# Patient Record
Sex: Female | Born: 1995 | Race: Black or African American | Hispanic: No | Marital: Single | Smoking: Never smoker
Health system: Southern US, Community
[De-identification: ages and names within clinical notes are randomized; demographics above are authoritative.]

## PROBLEM LIST (undated history)

## (undated) ENCOUNTER — Emergency Department (HOSPITAL_COMMUNITY): Discharge status: Absent without leave | Payer: Medicaid Other

## (undated) ENCOUNTER — Inpatient Hospital Stay (HOSPITAL_COMMUNITY): Payer: Self-pay

## (undated) HISTORY — PX: NO PAST SURGERIES: SHX2092

---
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
## 2014-08-04 ENCOUNTER — Encounter (HOSPITAL_COMMUNITY): Payer: Self-pay | Admitting: *Deleted

## 2014-08-04 ENCOUNTER — Emergency Department (HOSPITAL_COMMUNITY)
Admission: EM | Admit: 2014-08-04 | Discharge: 2014-08-04 | Disposition: A | Discharge status: Home / Self Care | Payer: Managed Care, Other (non HMO) | Attending: Emergency Medicine | Admitting: Emergency Medicine

## 2014-08-04 DIAGNOSIS — R1030 Lower abdominal pain, unspecified: Secondary | ICD-10-CM | POA: Diagnosis present

## 2014-08-04 DIAGNOSIS — Z3202 Encounter for pregnancy test, result negative: Secondary | ICD-10-CM | POA: Diagnosis not present

## 2014-08-04 DIAGNOSIS — N390 Urinary tract infection, site not specified: Secondary | ICD-10-CM | POA: Insufficient documentation

## 2014-08-04 LAB — COMPREHENSIVE METABOLIC PANEL
ALBUMIN: 3.9 g/dL (ref 3.5–5.2)
ALK PHOS: 94 U/L (ref 39–117)
ALT: 14 U/L (ref 0–35)
ANION GAP: 8 (ref 5–15)
AST: 20 U/L (ref 0–37)
BILIRUBIN TOTAL: 0.7 mg/dL (ref 0.3–1.2)
BUN: 9 mg/dL (ref 6–23)
CO2: 25 mmol/L (ref 19–32)
CREATININE: 0.69 mg/dL (ref 0.50–1.10)
Calcium: 9 mg/dL (ref 8.4–10.5)
Chloride: 103 mEq/L (ref 96–112)
GFR calc Af Amer: >90 mL/min (ref >90)
GFR calc non Af Amer: >90 mL/min (ref >90)
GLUCOSE: 99 mg/dL (ref 70–99)
POTASSIUM: 3.8 mmol/L (ref 3.5–5.1)
SODIUM: 136 mmol/L (ref 135–145)
Total Protein: 7 g/dL (ref 6.0–8.3)

## 2014-08-04 LAB — URINALYSIS, ROUTINE W REFLEX MICROSCOPIC
Bilirubin Urine: NEGATIVE
GLUCOSE, UA: NEGATIVE mg/dL
HGB URINE DIPSTICK: NEGATIVE
Ketones, ur: 15 mg/dL — AB
Nitrite: NEGATIVE
PH: 6 (ref 5.0–8.0)
PROTEIN: NEGATIVE mg/dL
SPECIFIC GRAVITY, URINE: 1.033 — AB (ref 1.005–1.030)
Urobilinogen, UA: 1 mg/dL (ref 0.0–1.0)

## 2014-08-04 LAB — CBC WITH DIFFERENTIAL/PLATELET
BASOS ABS: 0 10*3/uL (ref 0.0–0.1)
BASOS PCT: 0 % (ref 0–1)
Eosinophils Absolute: 0.1 10*3/uL (ref 0.0–0.7)
Eosinophils Relative: 1 % (ref 0–5)
HEMATOCRIT: 36.7 % (ref 36.0–46.0)
HEMOGLOBIN: 11.9 g/dL — AB (ref 12.0–15.0)
LYMPHS ABS: 3 10*3/uL (ref 0.7–4.0)
LYMPHS PCT: 23 % (ref 12–46)
MCH: 28.7 pg (ref 26.0–34.0)
MCHC: 32.4 g/dL (ref 30.0–36.0)
MCV: 88.4 fL (ref 78.0–100.0)
MONO ABS: 1.1 10*3/uL — AB (ref 0.1–1.0)
Monocytes Relative: 8 % (ref 3–12)
NEUTROS ABS: 9 10*3/uL — AB (ref 1.7–7.7)
NEUTROS PCT: 68 % (ref 43–77)
Platelets: 374 10*3/uL (ref 150–400)
RBC: 4.15 MIL/uL (ref 3.87–5.11)
RDW: 12.9 % (ref 11.5–15.5)
WBC: 13.2 10*3/uL — ABNORMAL HIGH (ref 4.0–10.5)

## 2014-08-04 LAB — URINE MICROSCOPIC-ADD ON

## 2014-08-04 LAB — LIPASE, BLOOD: Lipase: 25 U/L (ref 11–59)

## 2014-08-04 LAB — PREGNANCY, URINE: Preg Test, Ur: NEGATIVE

## 2014-08-04 MED ORDER — SULFAMETHOXAZOLE-TRIMETHOPRIM 800-160 MG PO TABS: 1.0000 | ORAL_TABLET | Freq: Two times a day (BID) | ORAL | Status: DC

## 2014-08-04 MED ORDER — PHENAZOPYRIDINE HCL 200 MG PO TABS: 200.0000 mg | ORAL_TABLET | Freq: Three times a day (TID) | ORAL | Status: DC

## 2014-08-04 NOTE — Discharge Instructions (Signed)

## 2014-08-04 NOTE — ED Provider Notes (Signed)
CSN: [id number]     Arrival date & time 08/04/14  2159 History  This chart was scribed for Brandi BakerAnthony T Seamus Warehime, MD by Murriel HopperAlec Bankhead, ED Scribe. This patient was seen in room D31C/D31C and the patient's care was started at 11:19 PM.    Chief Complaint  Patient presents with  . Abdominal Pain    The history is provided by the patient. No language interpreter was used.     HPI Comments: Brandi Henson is a 19 y.o. female who presents to the Emergency Department complaining of intermittent, sharp lower abdominal pain with associated dysuria that has been present for two days. Pt states that her symptoms worsen with movement. Pt states that she came into the ED tonight because her pain was so severe this morning that she could not move. Pt describes the pain as a sharp pressure in the area. Pt denies fever, chills, vomiting, diarrhea, vaginal discharge, or urinary frequency. Patient denies any prior history of these current symptoms and no treatment use prior to arrival    History reviewed. No pertinent past medical history. History reviewed. No pertinent past surgical history. History reviewed. No pertinent family history. History  Substance Use Topics  . Smoking status: Never Smoker   . Smokeless tobacco: Not on file  . Alcohol Use: Not on file   OB History    No data available     Review of Systems  Constitutional: Negative for fever and chills.  Gastrointestinal: Positive for abdominal pain. Negative for vomiting and diarrhea.  Genitourinary: Positive for dysuria. Negative for frequency and vaginal discharge.  All other systems reviewed and are negative.     Allergies  Review of patient's allergies indicates no known allergies.  Home Medications   Prior to Admission medications   Not on File   BP 117/62 mmHg  Pulse 85  Temp(Src) 98.2 F (36.8 C) (Oral)  Resp 21  Ht 5\' 10"  (1.778 m)  Wt 156 lb (70.761 kg)  BMI 22.38 kg/m2  SpO2 100%  LMP 07/19/2014 Physical Exam   Constitutional: She is oriented to person, place, and time. She appears well-developed and well-nourished.  Non-toxic appearance. No distress.  HENT:  Head: Normocephalic and atraumatic.  Eyes: Conjunctivae, EOM and lids are normal. Pupils are equal, round, and reactive to light.  Neck: Normal range of motion. Neck supple. No tracheal deviation present. No thyroid mass present.  Cardiovascular: Normal rate, regular rhythm and normal heart sounds.  Exam reveals no gallop.   No murmur heard. Pulmonary/Chest: Effort normal and breath sounds normal. No stridor. No respiratory distress. She has no decreased breath sounds. She has no wheezes. She has no rhonchi. She has no rales.  Abdominal: Soft. Normal appearance and bowel sounds are normal. She exhibits no distension. There is tenderness. There is no rebound, no guarding and no CVA tenderness.  Suprapubic tenderness  No guarding No rebound  Musculoskeletal: Normal range of motion. She exhibits no edema or tenderness.  Neurological: She is alert and oriented to person, place, and time. She has normal strength. No cranial nerve deficit or sensory deficit. GCS eye subscore is 4. GCS verbal subscore is 5. GCS motor subscore is 6.  Skin: Skin is warm and dry. No abrasion and no rash noted.  Psychiatric: She has a normal mood and affect. Her speech is normal and behavior is normal.  Nursing note and vitals reviewed.   ED Course  Procedures (including critical care time)  DIAGNOSTIC STUDIES: Oxygen Saturation is 100% on RA,  normal by my interpretation.    COORDINATION OF CARE: 11:21 PM Discussed treatment plan with pt at bedside and pt agreed to plan.   Labs Review Labs Reviewed  CBC WITH DIFFERENTIAL - Abnormal; Notable for the following:    WBC 13.2 (*)    Hemoglobin 11.9 (*)    Neutro Abs 9.0 (*)    Monocytes Absolute 1.1 (*)    All other components within normal limits  COMPREHENSIVE METABOLIC PANEL  LIPASE, BLOOD  PREGNANCY, URINE   URINALYSIS, ROUTINE W REFLEX MICROSCOPIC    Imaging Review No results found.   EKG Interpretation None      MDM   Final diagnoses:  None    I personally performed the services described in this documentation, which was scribed in my presence. The recorded information has been reviewed and is accurate.  patient to be treated for her UTI.    Brandi Baker, MD 08/04/14 [id number]

## 2014-08-04 NOTE — ED Notes (Signed)
Pt in c/o left suprapubic abd pain with nausea for the last two days, denies vaginal discharge, c/o pain with urination, no distress noted

## 2014-08-04 NOTE — ED Notes (Signed)
Pt was unable to void, urine still needs to be collected

## 2015-01-18 ENCOUNTER — Ambulatory Visit (INDEPENDENT_AMBULATORY_CARE_PROVIDER_SITE_OTHER): Payer: Managed Care, Other (non HMO) | Admitting: Internal Medicine

## 2015-01-18 VITALS — BP 108/76 | HR 89 | Temp 98.3°F | Resp 17 | Ht 69.0 in | Wt 137.0 lb

## 2015-01-18 DIAGNOSIS — S161XXA Strain of muscle, fascia and tendon at neck level, initial encounter: Secondary | ICD-10-CM

## 2015-01-18 DIAGNOSIS — M545 Low back pain, unspecified: Secondary | ICD-10-CM

## 2015-01-18 DIAGNOSIS — S46812A Strain of other muscles, fascia and tendons at shoulder and upper arm level, left arm, initial encounter: Secondary | ICD-10-CM

## 2015-01-18 MED ORDER — CYCLOBENZAPRINE HCL 10 MG PO TABS: 10.0000 mg | ORAL_TABLET | Freq: Every day | ORAL | Status: DC

## 2015-01-18 MED ORDER — MELOXICAM 15 MG PO TABS: 15.0000 mg | ORAL_TABLET | Freq: Every day | ORAL | Status: DC
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
## 2015-11-07 ENCOUNTER — Encounter (HOSPITAL_COMMUNITY): Payer: Self-pay | Admitting: Emergency Medicine

## 2015-11-07 DIAGNOSIS — K5909 Other constipation: Secondary | ICD-10-CM | POA: Insufficient documentation

## 2015-11-07 DIAGNOSIS — Z791 Long term (current) use of non-steroidal anti-inflammatories (NSAID): Secondary | ICD-10-CM | POA: Insufficient documentation

## 2015-11-07 DIAGNOSIS — Z79899 Other long term (current) drug therapy: Secondary | ICD-10-CM | POA: Diagnosis not present

## 2015-11-07 DIAGNOSIS — Z3202 Encounter for pregnancy test, result negative: Secondary | ICD-10-CM | POA: Insufficient documentation

## 2015-11-07 DIAGNOSIS — R1031 Right lower quadrant pain: Secondary | ICD-10-CM | POA: Diagnosis present

## 2015-11-07 LAB — URINALYSIS, ROUTINE W REFLEX MICROSCOPIC
GLUCOSE, UA: NEGATIVE mg/dL
Hgb urine dipstick: NEGATIVE
Ketones, ur: NEGATIVE mg/dL
LEUKOCYTES UA: NEGATIVE
NITRITE: NEGATIVE
PH: 6 (ref 5.0–8.0)
PROTEIN: 30 mg/dL — AB
Specific Gravity, Urine: 1.028 (ref 1.005–1.030)

## 2015-11-07 LAB — COMPREHENSIVE METABOLIC PANEL
ALT: 10 U/L — ABNORMAL LOW (ref 14–54)
ANION GAP: 10 (ref 5–15)
AST: 16 U/L (ref 15–41)
Albumin: 3.9 g/dL (ref 3.5–5.0)
Alkaline Phosphatase: 52 U/L (ref 38–126)
BUN: 8 mg/dL (ref 6–20)
CHLORIDE: 106 mmol/L (ref 101–111)
CO2: 24 mmol/L (ref 22–32)
Calcium: 9 mg/dL (ref 8.9–10.3)
Creatinine, Ser: 0.76 mg/dL (ref 0.44–1.00)
Glucose, Bld: 82 mg/dL (ref 65–99)
POTASSIUM: 3.9 mmol/L (ref 3.5–5.1)
Sodium: 140 mmol/L (ref 135–145)
Total Bilirubin: 1.2 mg/dL (ref 0.3–1.2)
Total Protein: 6.9 g/dL (ref 6.5–8.1)

## 2015-11-07 LAB — URINE MICROSCOPIC-ADD ON: RBC / HPF: NONE SEEN RBC/hpf (ref 0–5)

## 2015-11-07 LAB — CBC
HEMATOCRIT: 35.8 % — AB (ref 36.0–46.0)
Hemoglobin: 11.7 g/dL — ABNORMAL LOW (ref 12.0–15.0)
MCH: 29.9 pg (ref 26.0–34.0)
MCHC: 32.7 g/dL (ref 30.0–36.0)
MCV: 91.6 fL (ref 78.0–100.0)
PLATELETS: 283 10*3/uL (ref 150–400)
RBC: 3.91 MIL/uL (ref 3.87–5.11)
RDW: 12.1 % (ref 11.5–15.5)
WBC: 7.3 10*3/uL (ref 4.0–10.5)

## 2015-11-07 LAB — POC URINE PREG, ED: Preg Test, Ur: NEGATIVE

## 2015-11-07 NOTE — ED Notes (Signed)
Pt. reports intermittent  RLQ cramping onset today , denies nausea or vomitting , no diarrhea or fever , denies urinary discomfort or vaginal discharge.

## 2015-11-08 ENCOUNTER — Emergency Department (HOSPITAL_COMMUNITY)
Admission: EM | Admit: 2015-11-08 | Discharge: 2015-11-08 | Disposition: A | Discharge status: Home / Self Care | Payer: Managed Care, Other (non HMO) | Attending: Emergency Medicine | Admitting: Emergency Medicine

## 2015-11-08 ENCOUNTER — Encounter (HOSPITAL_COMMUNITY): Payer: Self-pay | Admitting: Emergency Medicine

## 2015-11-08 DIAGNOSIS — IMO0001 Reserved for inherently not codable concepts without codable children: Secondary | ICD-10-CM

## 2015-11-08 DIAGNOSIS — K5909 Other constipation: Secondary | ICD-10-CM

## 2015-11-08 MED ORDER — GI COCKTAIL ~~LOC~~
30.0000 mL | Freq: Once | ORAL | Status: AC
  Administered 2015-11-08: 30 mL via ORAL
  Filled 2015-11-08: qty 30

## 2015-11-08 MED ORDER — DICYCLOMINE HCL 10 MG/ML IM SOLN
20.0000 mg | Freq: Once | INTRAMUSCULAR | Status: AC
  Administered 2015-11-08: 20 mg via INTRAMUSCULAR
  Filled 2015-11-08: qty 2

## 2015-11-08 MED ORDER — POLYETHYLENE GLYCOL 3350 17 GM/SCOOP PO POWD: 17.0000 g | Freq: Every day | ORAL | Status: DC

## 2015-11-08 NOTE — ED Provider Notes (Signed)
CSN: [id number]     Arrival date & time 11/07/15  2011 History  By signing my name below, I, Marisue HumbleMichelle Chaffee, attest that this documentation has been prepared under the direction and in the presence of Vonetta Foulk, MD . Electronically Signed: Marisue HumbleMichelle Chaffee, Scribe. 11/08/2015. 2:37 AM.   Chief Complaint  Patient presents with  . Abdominal Cramping   Patient is a 20 y.o. female presenting with cramps. The history is provided by the patient. No language interpreter was used.  Abdominal Cramping This is a recurrent problem. The current episode started yesterday. The problem occurs constantly. The problem has not changed since onset.Associated symptoms include abdominal pain. Pertinent negatives include no chest pain, no headaches and no shortness of breath. Nothing aggravates the symptoms. Nothing relieves the symptoms. She has tried nothing for the symptoms. The treatment provided no relief.   HPI Comments:  Rudy Jewlexis Licausi is a 20 y.o. female with no pertinent PMHx who presents to the Emergency Department complaining of moderate intermittent RLQ cramping for "years". She states pain is worse with urination; she usually uses bathroom 3x per day. Last BM a few days ago. No alleviating or exacerbating factors noted. Pt reports similar symptoms with past UTI. Pt notes very irregular diet and low water intake. LNMP 10/02/15. Denies constipation, vaginal discharge, or increased pain with BM.  History reviewed. No pertinent past medical history. History reviewed. No pertinent past surgical history. Family History  Problem Relation Age of Onset  . Diabetes Maternal Grandfather    Social History  Substance Use Topics  . Smoking status: Never Smoker   . Smokeless tobacco: None  . Alcohol Use: No   OB History    No data available     Review of Systems  Respiratory: Negative for shortness of breath.   Cardiovascular: Negative for chest pain.  Gastrointestinal: Positive for abdominal pain.  Negative for constipation and rectal pain.  Genitourinary: Negative for vaginal discharge.  Neurological: Negative for headaches.  All other systems reviewed and are negative.  Allergies  Review of patient's allergies indicates no known allergies.  Home Medications   Prior to Admission medications   Medication Sig Start Date End Date Taking? Authorizing Provider  cyclobenzaprine (FLEXERIL) 10 MG tablet Take 1 tablet (10 mg total) by mouth at bedtime. 01/18/15   Tonye Pearsonobert P Doolittle, MD  meloxicam (MOBIC) 15 MG tablet Take 1 tablet (15 mg total) by mouth daily. 01/18/15   Tonye Pearsonobert P Doolittle, MD  phenazopyridine (PYRIDIUM) 200 MG tablet Take 1 tablet (200 mg total) by mouth 3 (three) times daily. Patient not taking: Reported on 01/18/2015 08/04/14   Lorre NickAnthony Allen, MD  sulfamethoxazole-trimethoprim [facility](SEPTRA DS) 800-160 MG per tablet Take 1 tablet by mouth every 12 (twelve) hours. Patient not taking: Reported on 01/18/2015 08/04/14   Lorre NickAnthony Allen, MD   BP 105/81 mmHg  Pulse 81  Temp(Src) 98.4 F (36.9 C) (Oral)  Resp 16  Ht 6\' 1"  (1.854 m)  Wt 134 lb (60.782 kg)  BMI 17.68 kg/m2  SpO2 100%  LMP 11/02/2015 Physical Exam  Constitutional: She is oriented to person, place, and time. She appears well-developed and well-nourished. No distress.  HENT:  Head: Normocephalic and atraumatic.  Mouth/Throat: Oropharynx is clear and moist.  Eyes: Pupils are equal, round, and reactive to light.  Neck: Normal range of motion. Neck supple.  Cardiovascular: Normal rate, regular rhythm and normal heart sounds.   Pulmonary/Chest: Effort normal and breath sounds normal. No respiratory distress. She has no wheezes. She has  no rales.  Abdominal: Soft. Bowel sounds are increased. There is no tenderness. There is no rigidity, no rebound, no guarding, no tenderness at McBurney's point and negative Murphy's sign.  very gassy throughout; constipation  Musculoskeletal: Normal range of motion.  Neurological: She is  alert and oriented to person, place, and time. She has normal reflexes.  Skin: Skin is warm and dry.  Psychiatric: She has a normal mood and affect.   ED Course  Procedures  DIAGNOSTIC STUDIES:  Oxygen Saturation is 99% on RA, normal by my interpretation.    COORDINATION OF CARE:  2:32 AM Will administer bentyl injection and GI cocktail. Will order UA, CMP, CBC and urine preg. Discussed treatment plan with pt at bedside and pt agreed to plan.  Labs Review Labs Reviewed  COMPREHENSIVE METABOLIC PANEL - Abnormal; Notable for the following:    ALT 10 (*)    All other components within normal limits  CBC - Abnormal; Notable for the following:    Hemoglobin 11.7 (*)    HCT 35.8 (*)    All other components within normal limits  URINALYSIS, ROUTINE W REFLEX MICROSCOPIC (NOT AT [facility]) - Abnormal; Notable for the following:    Color, Urine AMBER (*)    Bilirubin Urine SMALL (*)    Protein, ur 30 (*)    All other components within normal limits  URINE MICROSCOPIC-ADD ON - Abnormal; Notable for the following:    Squamous Epithelial / LPF 6-30 (*)    Bacteria, UA FEW (*)    All other components within normal limits  POC URINE PREG, ED    Imaging Review No results found. I have personally reviewed and evaluated these images and lab results as part of my medical decision-making.   EKG Interpretation None      MDM   Final diagnoses:  None   Results for orders placed or performed during the hospital encounter of 11/08/15  Comprehensive metabolic panel  Result Value Ref Range   Sodium 140 135 - 145 mmol/L   Potassium 3.9 3.5 - 5.1 mmol/L   Chloride 106 101 - 111 mmol/L   CO2 24 22 - 32 mmol/L   Glucose, Bld 82 65 - 99 mg/dL   BUN 8 6 - 20 mg/dL   Creatinine, Ser 2.95 0.44 - 1.00 mg/dL   Calcium 9.0 8.9 - 18.8 mg/dL   Total Protein 6.9 6.5 - 8.1 g/dL   Albumin 3.9 3.5 - 5.0 g/dL   AST 16 15 - 41 U/L   ALT 10 (L) 14 - 54 U/L   Alkaline Phosphatase 52 38 - 126 U/L   Total  Bilirubin 1.2 0.3 - 1.2 mg/dL   GFR calc non Af Amer >60 >60 mL/min   GFR calc Af Amer >60 >60 mL/min   Anion gap 10 5 - 15  CBC  Result Value Ref Range   WBC 7.3 4.0 - 10.5 K/uL   RBC 3.91 3.87 - 5.11 MIL/uL   Hemoglobin 11.7 (L) 12.0 - 15.0 g/dL   HCT 41.6 (L) 60.6 - 30.1 %   MCV 91.6 78.0 - 100.0 fL   MCH 29.9 26.0 - 34.0 pg   MCHC 32.7 30.0 - 36.0 g/dL   RDW 60.1 09.3 - 23.5 %   Platelets 283 150 - 400 K/uL  Urinalysis, Routine w reflex microscopic (not at [facility])  Result Value Ref Range   Color, Urine AMBER (A) YELLOW   APPearance CLEAR CLEAR   Specific Gravity, Urine 1.028 1.005 -  1.030   pH 6.0 5.0 - 8.0   Glucose, UA NEGATIVE NEGATIVE mg/dL   Hgb urine dipstick NEGATIVE NEGATIVE   Bilirubin Urine SMALL (A) NEGATIVE   Ketones, ur NEGATIVE NEGATIVE mg/dL   Protein, ur 30 (A) NEGATIVE mg/dL   Nitrite NEGATIVE NEGATIVE   Leukocytes, UA NEGATIVE NEGATIVE  Urine microscopic-add on  Result Value Ref Range   Squamous Epithelial / LPF 6-30 (A) NONE SEEN   WBC, UA 0-5 0 - 5 WBC/hpf   RBC / HPF NONE SEEN 0 - 5 RBC/hpf   Bacteria, UA FEW (A) NONE SEEN   Urine-Other MUCOUS PRESENT   POC urine preg, ED (not at [facility])  Result Value Ref Range   Preg Test, Ur NEGATIVE NEGATIVE   No results found.  Filed Vitals:   11/08/15 0300 11/08/15 0330  BP: 108/62 105/81  Pulse: 73 81  Temp:    Resp:      Medications  dicyclomine (BENTYL) injection 20 mg (20 mg Intramuscular Given 11/08/15 0330)  gi cocktail (Maalox,Lidocaine,Donnatal) (30 mLs Oral Given 11/08/15 0330)    No signs of surgical abdomen.  Constipation by history and physical with increased gas.  Begin miralax therapy.  Strict return precautions given  I personally performed the services described in this documentation, which was scribed in my presence. The recorded information has been reviewed and is accurate.      Cy Blamer, MD 11/08/15 [phone]

## 2015-11-08 NOTE — Discharge Instructions (Signed)
Constipation, Adult Constipation is when a person:  Poops (has a bowel movement) less than 3 times a week.  Has a hard time pooping.  Has poop that is dry, hard, or bigger than normal. HOME CARE   Eat foods with a lot of fiber in them. This includes fruits, vegetables, beans, and whole grains such as brown rice.  Avoid fatty foods and foods with a lot of sugar. This includes french fries, hamburgers, cookies, candy, and soda.  If you are not getting enough fiber from food, take products with added fiber in them (supplements).  Drink enough fluid to keep your pee (urine) clear or pale yellow.  Exercise on a regular basis, or as told by your doctor.  Go to the restroom when you feel like you need to poop. Do not hold it.  Only take medicine as told by your doctor. Do not take medicines that help you poop (laxatives) without talking to your doctor first. GET HELP RIGHT AWAY IF:   You have bright red blood in your poop (stool).  Your constipation lasts more than 4 days or gets worse.  You have belly (abdominal) or butt (rectal) pain.  You have thin poop (as thin as a pencil).  You lose weight, and it cannot be explained. MAKE SURE YOU:   Understand these instructions.  Will watch your condition.  Will get help right away if you are not doing well or get worse.   This information is not intended to replace advice given to you by your health care provider. Make sure you discuss any questions you have with your health care provider.   Document Released: 12/30/2007 Document Revised: 08/03/2014 Document Reviewed: 04/24/2013 Elsevier Interactive Patient Education 2016 [employer]vier Inc.  

## 2015-12-02 ENCOUNTER — Ambulatory Visit (INDEPENDENT_AMBULATORY_CARE_PROVIDER_SITE_OTHER): Payer: Managed Care, Other (non HMO) | Admitting: Family Medicine

## 2015-12-02 VITALS — BP 114/72 | HR 77 | Temp 98.3°F | Resp 18 | Ht 69.75 in | Wt 132.6 lb

## 2015-12-02 DIAGNOSIS — R55 Syncope and collapse: Secondary | ICD-10-CM | POA: Diagnosis not present

## 2015-12-02 DIAGNOSIS — R42 Dizziness and giddiness: Secondary | ICD-10-CM | POA: Diagnosis not present

## 2015-12-02 LAB — POCT CBC
Granulocyte percent: 65.9 %G (ref 37–80)
HCT, POC: 35.3 % — AB (ref 37.7–47.9)
Hemoglobin: 12.3 g/dL (ref 12.2–16.2)
LYMPH, POC: 2.1 (ref 0.6–3.4)
MCH, POC: 30.7 pg (ref 27–31.2)
MCHC: 34.7 g/dL (ref 31.8–35.4)
MCV: 88.3 fL (ref 80–97)
MID (CBC): 0.4 (ref 0–0.9)
MPV: 8.6 fL (ref 0–99.8)
POC Granulocyte: 4.7 (ref 2–6.9)
POC LYMPH %: 28.8 % (ref 10–50)
POC MID %: 5.3 % (ref 0–12)
Platelet Count, POC: 274 10*3/uL (ref 142–424)
RBC: 4 M/uL — AB (ref 4.04–5.48)
RDW, POC: 12.5 %
WBC: 7.2 10*3/uL (ref 4.6–10.2)

## 2015-12-02 LAB — GLUCOSE, POCT (MANUAL RESULT ENTRY): POC Glucose: 83 mg/dl (ref 70–99)

## 2015-12-02 LAB — POCT URINE PREGNANCY: PREG TEST UR: NEGATIVE

## 2015-12-02 NOTE — Patient Instructions (Addendum)
Drink plenty of fluids  Consider slightly increasing the salt intake in your diet  If you're continuing to have these episodes of feeling like you're going to faint or pass out please return. At that point you might need additional laboratory testing or imaging studies or a referral to a cardiologist for some heart monitoring tests. Most of these episodes however are fairly harmless events in young lady's especially. If you feel like you are going to pass out, sit down or lie down rather than falling down.  Always use sexual protection if you choose to be sexually involved  Near-Syncope Near-syncope (commonly known as near fainting) is sudden weakness, dizziness, or feeling like you might pass out. During an episode of near-syncope, you may also develop pale skin, have tunnel vision, or feel sick to your stomach (nauseous). Near-syncope may occur when getting up after sitting or while standing for a long time. It is caused by a sudden decrease in blood flow to the brain. This decrease can result from various causes or triggers, most of which are not serious. However, because near-syncope can sometimes be a sign of something serious, a medical evaluation is required. The specific cause is often not determined. HOME CARE INSTRUCTIONS  Monitor your condition for any changes. The following actions may help to alleviate any discomfort you are experiencing:  Have someone stay with you until you feel stable.  Lie down right away and prop your feet up if you start feeling like you might faint. Breathe deeply and steadily. Wait until all the symptoms have passed. Most of these episodes last only a few minutes. You may feel tired for several hours.   Drink enough fluids to keep your urine clear or pale yellow.   If you are taking blood pressure or heart medicine, get up slowly when seated or lying down. Take several minutes to sit and then stand. This can reduce dizziness.  Follow up with your health  care provider as directed. SEEK IMMEDIATE MEDICAL CARE IF:   You have a severe headache.   You have unusual pain in the chest, abdomen, or back.   You are bleeding from the mouth or rectum, or you have black or tarry stool.   You have an irregular or very fast heartbeat.   You have repeated fainting or have seizure-like jerking during an episode.   You faint when sitting or lying down.   You have confusion.   You have difficulty walking.   You have severe weakness.   You have vision problems.  MAKE SURE YOU:   Understand these instructions.  Will watch your condition.  Will get help right away if you are not doing well or get worse.   This information is not intended to replace advice given to you by your health care provider. Make sure you discuss any questions you have with your health care provider.   Document Released: 07/13/2005 Document Revised: 07/18/2013 Document Reviewed: 12/16/2012 Elsevier Interactive Patient Education 2016 [employer]    IF you received an x-ray today, you will receive an invoice from [facility]. Please contact [facility] at [phone] with questions or concerns regarding your invoice.   IF you received labwork today, you will receive an invoice from [facility]. Please contact [facility] at [phone] with questions or concerns regarding your invoice.   Our billing staff will not be able to assist you with questions regarding bills from these companies.  You will be contacted with the lab results  as soon as they are available. The fastest way to get your results is to activate your My Chart account. Instructions are located on the last page of this paperwork. If you have not heard from [place] regarding the results in 2 weeks, please contact this office.

## 2015-12-02 NOTE — Progress Notes (Signed)
Patient ID: Brandi Henson, female    DOB: 03-31-1996  Age: 20 y.o. MRN: [id number]  Chief Complaint  Patient presents with  . Dizziness    off and on since last Thursday    Subjective:   20 year old generally healthy female who works 2 jobs at [employer] and [employer]. She has had problems since last week of several episodes of orthostatic dizziness. 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 She can sit down and things clear up. She has had to quit her position several times. She has had it happen at home but she sits down or lies down and things are fine. She says she drinks plenty of fluids. Her last menstrual cycle was on April 1. She is sexually involved, not using protection, and predicted her menstrual cycle to come on on May 8. She is not taking any regular medications, not been drinking or using drugs. No nausea or vomiting. No other major symptoms except for the orthostatic dizziness.  Current allergies, medications, problem list, past/family and social histories reviewed.  Objective:  BP 114/72 mmHg  Pulse 77  Temp(Src) 98.3 F (36.8 C) (Oral)  Resp 18  Ht 5' 9.75" (1.772 m)  Wt 132 lb 9.6 oz (60.147 kg)  BMI 19.16 kg/m2  SpO2 96%  LMP 11/02/2015  No major acute distress. Her TMs normal. Eyes PERRLA. Throat clear. Neck supple without nodes or thyromegaly. No carotid bruits. Chest clear. Heart regular without murmur. Abdomen soft and nontender.  Assessment & Plan:   Assessment: 1. Orthostatic dizziness   2. Near syncope       Plan: Check labs including CBC, urine hCG, glucose  Orders Placed This Encounter  Procedures  . POCT urine pregnancy  . POCT CBC  . POCT glucose (manual entry)    No orders of the defined types were placed in this encounter.    Results for orders placed or performed in visit on 12/02/15  POCT urine pregnancy  Result Value Ref Range   Preg Test, Ur Negative Negative  POCT CBC  Result  Value Ref Range   WBC 7.2 4.6 - 10.2 K/uL   Lymph, poc 2.1 0.6 - 3.4   POC LYMPH PERCENT 28.8 10 - 50 %L   MID (cbc) 0.4 0 - 0.9   POC MID % 5.3 0 - 12 %M   POC Granulocyte 4.7 2 - 6.9   Granulocyte percent 65.9 37 - 80 %G   RBC 4.00 (A) 4.04 - 5.48 M/uL   Hemoglobin 12.3 12.2 - 16.2 g/dL   HCT, POC 78.235.3 (A) 95.637.7 - 47.9 %   MCV 88.3 80 - 97 fL   MCH, POC 30.7 27 - 31.2 pg   MCHC 34.7 31.8 - 35.4 g/dL   RDW, POC 21.312.5 %   Platelet Count, POC 274 142 - 424 K/uL   MPV 8.6 0 - 99.8 fL  POCT glucose (manual entry)  Result Value Ref Range   POC Glucose 83 70 - 99 mg/dl        Patient Instructions   Drink plenty of fluids  Consider slightly increasing the salt intake in your diet  If you're continuing to have these episodes of feeling like you're going to faint or pass out please return. At that point you might need additional laboratory testing or imaging studies or a referral to a cardiologist for some heart monitoring tests. Most of these episodes however are fairly harmless events in  young lady's especially. If you feel like you are going to pass out, sit down or lie down rather than falling down.  Always use sexual protection if you choose to be sexually involved  Near-Syncope Near-syncope (commonly known as near fainting) is sudden weakness, dizziness, or feeling like you might pass out. During an episode of near-syncope, you may also develop pale skin, have tunnel vision, or feel sick to your stomach (nauseous). Near-syncope may occur when getting up after sitting or while standing for a long time. It is caused by a sudden decrease in blood flow to the brain. This decrease can result from various causes or triggers, most of which are not serious. However, because near-syncope can sometimes be a sign of something serious, a medical evaluation is required. The specific cause is often not determined. HOME CARE INSTRUCTIONS  Monitor your condition for any changes. The following  actions may help to alleviate any discomfort you are experiencing:  Have someone stay with you until you feel stable.  Lie down right away and prop your feet up if you start feeling like you might faint. Breathe deeply and steadily. Wait until all the symptoms have passed. Most of these episodes last only a few minutes. You may feel tired for several hours.   Drink enough fluids to keep your urine clear or pale yellow.   If you are taking blood pressure or heart medicine, get up slowly when seated or lying down. Take several minutes to sit and then stand. This can reduce dizziness.  Follow up with your health care provider as directed. SEEK IMMEDIATE MEDICAL CARE IF:   You have a severe headache.   You have unusual pain in the chest, abdomen, or back.   You are bleeding from the mouth or rectum, or you have black or tarry stool.   You have an irregular or very fast heartbeat.   You have repeated fainting or have seizure-like jerking during an episode.   You faint when sitting or lying down.   You have confusion.   You have difficulty walking.   You have severe weakness.   You have vision problems.  MAKE SURE YOU:   Understand these instructions.  Will watch your condition.  Will get help right away if you are not doing well or get worse.   This information is not intended to replace advice given to you by your health care provider. Make sure you discuss any questions you have with your health care provider.   Document Released: 07/13/2005 Document Revised: 07/18/2013 Document Reviewed: 12/16/2012 Elsevier Interactive Patient Education 2016 [employer].    IF you received an x-ray today, you will receive an invoice from [facility]. Please contact [facility] at [phone] with questions or concerns regarding your invoice.   IF you received labwork today, you will receive an invoice from [employer]. Please  contact [facility] at [phone] with questions or concerns regarding your invoice.   Our billing staff will not be able to assist you with questions regarding bills from these companies.  You will be contacted with the lab results as soon as they are available. The fastest way to get your results is to activate your My Chart account. Instructions are located on the last page of this paperwork. If you have not heard from [place] regarding the results in 2 weeks, please contact this office.          Return if symptoms worsen or fail to improve.  Chelsea Nusz, MD 12/02/2015

## 2015-12-15 ENCOUNTER — Encounter (HOSPITAL_COMMUNITY): Payer: Self-pay

## 2015-12-15 ENCOUNTER — Ambulatory Visit (HOSPITAL_COMMUNITY)
Admission: EM | Admit: 2015-12-15 | Discharge: 2015-12-15 | Disposition: A | Discharge status: Home / Self Care | Payer: Managed Care, Other (non HMO) | Attending: Emergency Medicine | Admitting: Emergency Medicine

## 2015-12-15 DIAGNOSIS — Z3201 Encounter for pregnancy test, result positive: Secondary | ICD-10-CM

## 2015-12-15 DIAGNOSIS — R55 Syncope and collapse: Secondary | ICD-10-CM | POA: Diagnosis not present

## 2015-12-15 DIAGNOSIS — Z3490 Encounter for supervision of normal pregnancy, unspecified, unspecified trimester: Secondary | ICD-10-CM

## 2015-12-15 LAB — POCT PREGNANCY, URINE: Preg Test, Ur: POSITIVE — AB

## 2015-12-15 LAB — POCT I-STAT, CHEM 8
BUN: 9 mg/dL (ref 6–20)
CALCIUM ION: 1.12 mmol/L (ref 1.12–1.23)
CHLORIDE: 106 mmol/L (ref 101–111)
Creatinine, Ser: 0.7 mg/dL (ref 0.44–1.00)
GLUCOSE: 96 mg/dL (ref 65–99)
HCT: 40 % (ref 36.0–46.0)
Hemoglobin: 13.6 g/dL (ref 12.0–15.0)
Potassium: 4 mmol/L (ref 3.5–5.1)
Sodium: 140 mmol/L (ref 135–145)
TCO2: 23 mmol/L (ref 0–100)

## 2015-12-15 MED ORDER — PRENATAL COMPLETE 14-0.4 MG PO TABS: 1.0000 | ORAL_TABLET | Freq: Every day | ORAL | Status: DC

## 2015-12-15 NOTE — Discharge Instructions (Signed)
[facility]: [address] [place] [id number]  [phone]  [facility]: [address] [place] [id number]   [phone]  [facility]: [address] [place] [address]  [phone]  [facility] : [address] [place] [id number] [phone]  [facility]: [address] [place] [phone]  [facility]: [address] [place] [phone]  Dr. Oneal GroutMahima Pandey [address] [facility] [place] [id number]  [phone]  Dr. Jackie PlumGeorge Osei-Bonsu, [facility]. [address] [place] [address]  [phone]

## 2015-12-15 NOTE — ED Notes (Signed)
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

## 2015-12-15 NOTE — ED Provider Notes (Signed)
HPI  SUBJECTIVE:  Brandi Henson is a 20 y.o. female who presents with a syncopal episode after standing still in a hot room for an hour. 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 Patient states that she had prodromal symptoms of lightheadedness, tunnel vision prior to passing out. States that she fell forward, hit her bottom lip on the past register. She states that onlookers caught her before she fell to the ground. She denies any head or neck injury. She reports having a mild headache afterwards, which resolved after eating. EMS was called, she had a normal EKG, her fingerstick was 83. Denies seizure-like activity, She returned rapidly to consciousness, without any postictal confusion. No chest pain, shortness of breath, palpitations prior to the syncopal episode. No arm or leg weakness, facial weakness. No abdominal pain, vaginal bleeding, back pain. She states that she has not been eating or drinking well, Particularly not a whole lot of water. No nausea or vomiting, epistaxis, melena, hematochezia, hematuria. She has no urinary complaints. She denies any stimulants or herbal supplement use. She does not take any medicines on a regular basis. Past medical history negative for atrial fibrillation, arrhythmia, PE, DVT, cancer. She is a smoker. No alcohol or illicit drugs. Family history negative for MI, sudden death, hypertrophic cardiomyopathy, syncope. No surgery in the past 4 weeks, hemoptysis, calf or leg swelling, exogenous estrogen, immobilization. Patient states that she has been having these "dizzy spells"/ near syncopal episodes for the past 3 months, but today was the first time that she had a syncopal episode. She was evaluated at another urgent care earlier this month for the same, thought to have orthostatic hypotension was advised to push fluids and increase her salt intake. PMD: None. LMP: 4/1.    History reviewed. No pertinent past medical history.  History reviewed. No pertinent past  surgical history.  Family History  Problem Relation Age of Onset  . Diabetes Maternal Grandfather     Social History  Substance Use Topics  . Smoking status: Never Smoker   . Smokeless tobacco: None  . Alcohol Use: No    No current facility-administered medications for this encounter.  Current outpatient prescriptions:  .  Prenatal Vit-Fe Fumarate-FA (PRENATAL COMPLETE) 14-0.4 MG TABS, Take 1 tablet by mouth daily., Disp: 30 each, Rfl: 0  No Known Allergies   ROS  As noted in HPI.   Physical Exam  BP 125/73 mmHg  Pulse 95  Temp(Src) 98.5 F (36.9 C) (Oral)  Resp 18  SpO2 99%  LMP 10/26/2015 (Exact Date)  Orthostatic VS for the past 24 hrs:  BP- Lying Pulse- Lying BP- Sitting Pulse- Sitting BP- Standing at 0 minutes Pulse- Standing at 0 minutes  12/15/15 1957 124/75 mmHg 95 117/72 mmHg 84 123/81 mmHg 95    Constitutional: Well developed, well nourished, no acute distress Eyes: PERRL, EOMI, conjunctiva normal bilaterally HENT: Normocephalic, atraumatic,mucus membranes moist. Superficial abrasion lower lip. No dental trauma. No other facial tenderness. Respiratory: Clear to auscultation bilaterally, no rales, no wheezing, no rhonchi Cardiovascular: Normal rate and rhythm, no murmurs, no gallops, no rubs. No carotid bruit GI: Normal appearance, Soft, nondistended, normal bowel sounds, nontender, no rebound, no guarding Back: no CVAT skin: No rash, skin intact Musculoskeletal: Calves symmetric, nontender No edema, no tenderness, no deformities Neurologic: Alert & oriented x 3, speech fluid, coordination normal, CN II-XII  intact, no motor deficits, sensation grossly intact Psychiatric: Speech and behavior appropriate   ED Course   Medications - No data to display  Orders Placed This Encounter  Procedures  . Orthostatic vital signs    Standing Status: Standing     Number of Occurrences: 1     Standing Expiration Date:   . I-STAT, chem 8    Standing Status:  Standing     Number of Occurrences: 1     Standing Expiration Date:   . Pregnancy, urine POC    Standing Status: Standing     Number of Occurrences: 1     Standing Expiration Date:   . ED EKG    Standing Status: Standing     Number of Occurrences: 1     Standing Expiration Date:     Order Specific Question:  Reason for Exam    Answer:  Syncope   Results for orders placed or performed during the hospital encounter of 12/15/15 (from the past 24 hour(s))  I-STAT, chem 8     Status: None   Collection Time: 12/15/15  7:52 PM  Result Value Ref Range   Sodium 140 135 - 145 mmol/L   Potassium 4.0 3.5 - 5.1 mmol/L   Chloride 106 101 - 111 mmol/L   BUN 9 6 - 20 mg/dL   Creatinine, Ser 1.61 0.44 - 1.00 mg/dL   Glucose, Bld 96 65 - 99 mg/dL   Calcium, Ion 0.96 0.45 - 1.23 mmol/L   TCO2 23 0 - 100 mmol/L   Hemoglobin 13.6 12.0 - 15.0 g/dL   HCT 40.9 81.1 - 91.4 %  Pregnancy, urine POC     Status: Abnormal   Collection Time: 12/15/15  7:54 PM  Result Value Ref Range   Preg Test, Ur POSITIVE (A) NEGATIVE   No results found.  ED Clinical Impression  Pregnancy  Syncope, unspecified syncope type   ED Assessment/Plan  Previous records reviewed. Patient was seen on 5/8 for the same, thought to have a orthostatic dizziness, near syncope. Was advised to push fluids and increase salt intake. hgb/hematocrit 12.3/35.3. Glucose was 83 on that visit.  EKG: Normal sinus rhythm, rate 82. Normal axis, normal intervals. No hypertrophy. No changes compared to the EMS EKG. No other EKG for comparison.  Labs reviewed. I-STAT normal. Urine pregnancy positive.  She denies any vaginal bleeding or abdominal pain. Doubt ruptured ectopic. She meets PERC criteria. Doubt PE. No evidence of stroke. Doubt coronary cause of her symptoms.  Presentation most consistent with syncope, most likely from standing still in a hot room for too long. Advised taking frequent breaks, moving her legs. Advised to push  fluids, also increase her salt intake. We will refer to cardiology for further workup, OB/GYN within the week for ongoing prenatal care, and provide a list of primary care practices for routine care. Sending home with prenatal vitamins. Discussed labs, EKG, medical decision-making, plan for follow-up, signs and symptoms that should prompt return to the emergency department. Patient agrees with plan.   This clinic note was created using Scientist, clinical (histocompatibility and immunogenetics). Therefore, there may be occasional mistakes despite careful proofreading.  ?  Domenick Gong, MD 12/16/15 [phone]

## 2016-01-10 ENCOUNTER — Encounter: Payer: Self-pay | Admitting: Obstetrics

## 2016-01-10 ENCOUNTER — Ambulatory Visit (INDEPENDENT_AMBULATORY_CARE_PROVIDER_SITE_OTHER): Payer: Managed Care, Other (non HMO) | Admitting: Obstetrics

## 2016-01-10 ENCOUNTER — Encounter: Payer: Managed Care, Other (non HMO) | Admitting: Certified Nurse Midwife

## 2016-01-10 VITALS — BP 111/68 | HR 83 | Wt 135.0 lb

## 2016-01-10 DIAGNOSIS — Z1389 Encounter for screening for other disorder: Secondary | ICD-10-CM | POA: Diagnosis not present

## 2016-01-10 DIAGNOSIS — Z3401 Encounter for supervision of normal first pregnancy, first trimester: Secondary | ICD-10-CM | POA: Diagnosis not present

## 2016-01-10 DIAGNOSIS — Z331 Pregnant state, incidental: Secondary | ICD-10-CM | POA: Diagnosis not present

## 2016-01-10 LAB — POCT URINALYSIS DIPSTICK
BILIRUBIN UA: NEGATIVE
Blood, UA: NEGATIVE
GLUCOSE UA: NEGATIVE
KETONES UA: NEGATIVE
LEUKOCYTES UA: NEGATIVE
NITRITE UA: NEGATIVE
Protein, UA: NEGATIVE
Spec Grav, UA: 1.015
Urobilinogen, UA: NEGATIVE
pH, UA: 6.5

## 2016-01-13 LAB — NUSWAB VG, CANDIDA 6SP
ATOPOBIUM VAGINAE: HIGH {score} — AB
BVAB 2: HIGH Score — AB
CANDIDA ALBICANS, NAA: NEGATIVE
CANDIDA PARAPSILOSIS, NAA: NEGATIVE
Candida glabrata, NAA: NEGATIVE
Candida krusei, NAA: NEGATIVE
Candida lusitaniae, NAA: NEGATIVE
Candida tropicalis, NAA: NEGATIVE
MEGASPHAERA 1: HIGH {score} — AB
Trich vag by NAA: NEGATIVE

## 2016-01-14 ENCOUNTER — Emergency Department (HOSPITAL_COMMUNITY)
Admission: EM | Admit: 2016-01-14 | Discharge: 2016-01-14 | Disposition: A | Discharge status: Home / Self Care | Payer: Managed Care, Other (non HMO) | Attending: Emergency Medicine | Admitting: Emergency Medicine

## 2016-01-14 ENCOUNTER — Encounter (HOSPITAL_COMMUNITY): Payer: Self-pay | Admitting: Emergency Medicine

## 2016-01-14 ENCOUNTER — Other Ambulatory Visit: Payer: Self-pay | Admitting: Obstetrics

## 2016-01-14 DIAGNOSIS — B9689 Other specified bacterial agents as the cause of diseases classified elsewhere: Secondary | ICD-10-CM

## 2016-01-14 DIAGNOSIS — R42 Dizziness and giddiness: Secondary | ICD-10-CM | POA: Insufficient documentation

## 2016-01-14 DIAGNOSIS — Z3A00 Weeks of gestation of pregnancy not specified: Secondary | ICD-10-CM | POA: Diagnosis not present

## 2016-01-14 DIAGNOSIS — O210 Mild hyperemesis gravidarum: Secondary | ICD-10-CM | POA: Insufficient documentation

## 2016-01-14 DIAGNOSIS — N760 Acute vaginitis: Principal | ICD-10-CM

## 2016-01-14 LAB — URINALYSIS, ROUTINE W REFLEX MICROSCOPIC
Bilirubin Urine: NEGATIVE
GLUCOSE, UA: NEGATIVE mg/dL
HGB URINE DIPSTICK: NEGATIVE
KETONES UR: 15 mg/dL — AB
LEUKOCYTES UA: NEGATIVE
Nitrite: NEGATIVE
PH: 6.5 (ref 5.0–8.0)
PROTEIN: 30 mg/dL — AB
Specific Gravity, Urine: 1.033 — ABNORMAL HIGH (ref 1.005–1.030)

## 2016-01-14 LAB — COMPREHENSIVE METABOLIC PANEL
ALBUMIN: 3.7 g/dL (ref 3.5–5.0)
ALK PHOS: 42 U/L (ref 38–126)
ALT: 12 U/L — AB (ref 14–54)
AST: 15 U/L (ref 15–41)
Anion gap: 7 (ref 5–15)
BILIRUBIN TOTAL: 1.1 mg/dL (ref 0.3–1.2)
BUN: 8 mg/dL (ref 6–20)
CALCIUM: 9.1 mg/dL (ref 8.9–10.3)
CO2: 21 mmol/L — ABNORMAL LOW (ref 22–32)
CREATININE: 0.68 mg/dL (ref 0.44–1.00)
Chloride: 106 mmol/L (ref 101–111)
GFR calc Af Amer: >60 mL/min (ref >60)
GFR calc non Af Amer: >60 mL/min (ref >60)
GLUCOSE: 98 mg/dL (ref 65–99)
Potassium: 3.8 mmol/L (ref 3.5–5.1)
Sodium: 134 mmol/L — ABNORMAL LOW (ref 135–145)
TOTAL PROTEIN: 7 g/dL (ref 6.5–8.1)

## 2016-01-14 LAB — POC URINE PREG, ED: Preg Test, Ur: POSITIVE — AB

## 2016-01-14 LAB — CBC
HEMATOCRIT: 36 % (ref 36.0–46.0)
Hemoglobin: 11.9 g/dL — ABNORMAL LOW (ref 12.0–15.0)
MCH: 29.8 pg (ref 26.0–34.0)
MCHC: 33.1 g/dL (ref 30.0–36.0)
MCV: 90.2 fL (ref 78.0–100.0)
Platelets: 324 10*3/uL (ref 150–400)
RBC: 3.99 MIL/uL (ref 3.87–5.11)
RDW: 12.2 % (ref 11.5–15.5)
WBC: 10.4 10*3/uL (ref 4.0–10.5)

## 2016-01-14 LAB — URINE MICROSCOPIC-ADD ON

## 2016-01-14 LAB — HCG, QUANTITATIVE, PREGNANCY: hCG, Beta Chain, Quant, S: 81951 m[IU]/mL — ABNORMAL HIGH (ref <5)

## 2016-01-14 MED ORDER — SODIUM CHLORIDE 0.9 % IV BOLUS (SEPSIS)
1000.0000 mL | Freq: Once | INTRAVENOUS | Status: AC
  Administered 2016-01-14: 1000 mL via INTRAVENOUS

## 2016-01-14 MED ORDER — METRONIDAZOLE 500 MG PO TABS: 500.0000 mg | ORAL_TABLET | Freq: Two times a day (BID) | ORAL | Status: DC

## 2016-01-14 MED ORDER — METOCLOPRAMIDE HCL 10 MG PO TABS: 10.0000 mg | ORAL_TABLET | Freq: Four times a day (QID) | ORAL | Status: DC | PRN

## 2016-01-14 MED ORDER — METOCLOPRAMIDE HCL 5 MG/ML IJ SOLN
10.0000 mg | INTRAMUSCULAR | Status: AC
  Administered 2016-01-14: 10 mg via INTRAVENOUS
  Filled 2016-01-14: qty 2

## 2016-01-14 NOTE — ED Notes (Signed)
Pt. reports nausea/emesis with lightheadedness onset today , pt. stated she is pregnant but unsure of AOG , denies fever or abdominal pain .

## 2016-01-14 NOTE — ED Provider Notes (Signed)
CSN: [id number]     Arrival date & time 01/14/16  1856 History   First MD Initiated Contact with Patient 01/14/16 2127     Chief Complaint  Patient presents with  . Emesis During Pregnancy  . Dizziness     (Consider location/radiation/quality/duration/timing/severity/associated sxs/prior Treatment) HPI Comments: 20 year old G1P0 female percent's to the emergency department for evaluation of nausea and vomiting. She reports that she is pregnant. Last menstrual period was 10/26/2015. She reports that she has had nausea over the past few days. This worsened today. She had one episode of emesis prior to arrival and complains of associated lightheadedness. Patient has had no syncope. She denies abdominal pain, fever, dysuria, hematuria. She has follow-up scheduled with [facility] in 2 weeks.  The history is provided by the patient. No language interpreter was used.    History reviewed. No pertinent past medical history. History reviewed. No pertinent past surgical history. Family History  Problem Relation Age of Onset  . Diabetes Maternal Grandfather    Social History  Substance Use Topics  . Smoking status: Never Smoker   . Smokeless tobacco: None  . Alcohol Use: No   OB History    Gravida Para Term Preterm AB TAB SAB Ectopic Multiple Living   1               Review of Systems  Gastrointestinal: Positive for nausea and vomiting.  Neurological: Positive for light-headedness.  All other systems reviewed and are negative.   Allergies  Review of patient's allergies indicates no known allergies.  Home Medications   Prior to Admission medications   Medication Sig Start Date End Date Taking? Authorizing Provider  Prenatal Vit-Fe Fumarate-FA (PRENATAL COMPLETE) 14-0.4 MG TABS Take 1 tablet by mouth daily. 12/15/15  Yes Domenick Gong, MD  metoCLOPramide (REGLAN) 10 MG tablet Take 1 tablet (10 mg total) by mouth every 6 (six) hours as needed for nausea or vomiting.  01/14/16   Antony Madura, PA-C  metroNIDAZOLE (FLAGYL) 500 MG tablet Take 1 tablet (500 mg total) by mouth 2 (two) times daily. Patient not taking: Reported on 01/14/2016 01/14/16   Brock Bad, MD   BP 115/70 mmHg  Pulse 75  Temp(Src) 98.3 F (36.8 C) (Oral)  Resp 16  Ht  (1.803 m)  Wt 61.236 kg  BMI 18.84 kg/m2  SpO2 100%  LMP 10/26/2015 (Exact Date)   Physical Exam  Constitutional: She is oriented to person, place, and time. She appears well-developed and well-nourished. No distress.  Nontoxic appearing and in no distress  HENT:  Head: Normocephalic and atraumatic.  Eyes: Conjunctivae and EOM are normal. No scleral icterus.  Neck: Normal range of motion.  Cardiovascular: Normal rate, regular rhythm and intact distal pulses.   Pulmonary/Chest: Effort normal and breath sounds normal. No respiratory distress. She has no wheezes.  Respirations even and unlabored  Abdominal: Soft. She exhibits no distension. There is no tenderness. There is no rebound.  Soft, nontender abdomen.  Musculoskeletal: Normal range of motion.  Neurological: She is alert and oriented to person, place, and time. She exhibits normal muscle tone. Coordination normal.  GCS 15. Patient moving all extremities.  Skin: Skin is warm and dry. No rash noted. She is not diaphoretic. No erythema. No pallor.  Psychiatric: She has a normal mood and affect. Her behavior is normal.  Nursing note and vitals reviewed.   ED Course  Procedures (including critical care time) Labs Review Labs Reviewed  COMPREHENSIVE METABOLIC  PANEL - Abnormal; Notable for the following:    Sodium 134 (*)    CO2 21 (*)    ALT 12 (*)    All other components within normal limits  CBC - Abnormal; Notable for the following:    Hemoglobin 11.9 (*)    All other components within normal limits  URINALYSIS, ROUTINE W REFLEX MICROSCOPIC (NOT AT [facility]) - Abnormal; Notable for the following:    APPearance CLOUDY (*)    Specific Gravity,  Urine 1.033 (*)    Ketones, ur 15 (*)    Protein, ur 30 (*)    All other components within normal limits  HCG, QUANTITATIVE, PREGNANCY - Abnormal; Notable for the following:    hCG, Beta Chain, Quant, S [id number] (*)    All other components within normal limits  URINE MICROSCOPIC-ADD ON - Abnormal; Notable for the following:    Squamous Epithelial / LPF 6-30 (*)    Bacteria, UA FEW (*)    Casts HYALINE CASTS (*)    All other components within normal limits  POC URINE PREG, ED - Abnormal; Notable for the following:    Preg Test, Ur POSITIVE (*)    All other components within normal limits    Imaging Review No results found.   I have personally reviewed and evaluated these images and lab results as part of my medical decision-making.   EKG Interpretation None       EMERGENCY DEPARTMENT US PREGNANCY "Study: Limited Ultrasound of the Pelvis"  INDICATIONS:Pregnancy(required) Multiple views of the uterus and pelvic cavity are obtained with a multi-frequency probe.  APPROACH:Transabdominal   PERFORMED BY: Myself  IMAGES ARCHIVED?: Yes  LIMITATIONS: Emergent procedure  PREGNANCY FINDINGS: Fetal heart activity seen  INTERPRETATION: Viable intrauterine pregnancy  COMMENT(Estimate of Gestational Age): Estimated at ~11 weeks by LMP; c/w hCG of [id number]   11:29 PM Patient reassessed. She states that she is feeling better after receiving the nausea medication. Reexamination stable. Anticipate discharge after completion of IV fluid hydration.  MDM   Final diagnoses:  Morning sickness    20 year old female presents to the emergency department for evaluation of nausea in pregnancy. She had one episode of emesis this morning. She has no complaints of abdominal pain. Patient is afebrile. Vital signs stable. Intrauterine pregnancy confirmed at bedside with evidence of fetal heart activity. Urinalysis suggestive of mild dehydration. Patient given IV fluids in the emergency department.  Laboratory workup is, otherwise, noncontributory.  Nausea has improved following Reglan. Patient feels comfortable managing her symptoms further at home. Will discharge with a prescription for this medication for nausea control. Patient has prenatal follow-up scheduled with an OB/GYN in approximately 2 weeks. Return precautions discussed and provided. Patient discharged in satisfactory condition with no unaddressed concerns.   Filed Vitals:   01/14/16 1934 01/14/16 2145 01/14/16 2204  BP: 102/69 95/57 115/70  Pulse: 87 79 75  Temp: 98.3 F (36.8 C)    TempSrc: Oral    Resp: 16    Height: 5\' 11"  (1.803 m)    Weight: 61.236 kg    SpO2: 98% 98% 100%     Antony MaduraKelly Korben Carcione, PA-C 01/14/16 2332  Melene Planan Floyd, DO 01/14/16 2343

## 2016-01-14 NOTE — Discharge Instructions (Signed)

## 2016-01-14 NOTE — Progress Notes (Signed)
Subjective:    Brandi Henson is being seen today for her first obstetrical visit.  This is not a planned pregnancy. She is at Unknown gestation. Her obstetrical history is significant for none. Relationship with [name]: none. Patient does intend to breast feed. Pregnancy history fully reviewed.  The information documented in the HPI was reviewed and verified.  Menstrual History: OB History    Gravida Para Term Preterm AB TAB SAB Ectopic Multiple Living   1                Patient's last menstrual period was 10/26/2015 (exact date).    History reviewed. No pertinent past medical history.  History reviewed. No pertinent past surgical history.   (Not in a hospital admission) No Known Allergies  Social History  Substance Use Topics  . Smoking status: Never Smoker   . Smokeless tobacco: Not on file  . Alcohol Use: No    Family History  Problem Relation Age of Onset  . Diabetes Maternal Grandfather      Review of Systems Constitutional: negative for weight loss Gastrointestinal: negative for vomiting Genitourinary:negative for genital lesions and vaginal discharge and dysuria Musculoskeletal:negative for back pain Behavioral/Psych: negative for abusive relationship, depression, illegal drug usage and tobacco use    Objective:    BP 111/68 mmHg  Pulse 83  Wt 135 lb (61.236 kg)  LMP 10/26/2015 (Exact Date) General Appearance:    Alert, cooperative, no distress, appears stated age  Head:    Normocephalic, without obvious abnormality, atraumatic  Eyes:    PERRL, conjunctiva/corneas clear, EOM's intact, fundi    benign, both eyes  Ears:    Normal TM's and external ear canals, both ears  Nose:   Nares normal, septum midline, mucosa normal, no drainage    or sinus tenderness  Throat:   Lips, mucosa, and tongue normal; teeth and gums normal  Neck:   Supple, symmetrical, trachea midline, no adenopathy;    thyroid:  no enlargement/tenderness/nodules; no carotid   bruit or JVD   Back:     Symmetric, no curvature, ROM normal, no CVA tenderness  Lungs:     Clear to auscultation bilaterally, respirations unlabored  Chest Wall:    No tenderness or deformity   Heart:    Regular rate and rhythm, S1 and S2 normal, no murmur, rub   or gallop  Breast Exam:    No tenderness, masses, or nipple abnormality  Abdomen:     Soft, non-tender, bowel sounds active all four quadrants,    no masses, no organomegaly  Genitalia:    Normal female without lesion, discharge or tenderness  Extremities:   Extremities normal, atraumatic, no cyanosis or edema  Pulses:   2+ and symmetric all extremities  Skin:   Skin color, texture, turgor normal, no rashes or lesions  Lymph nodes:   Cervical, supraclavicular, and axillary nodes normal  Neurologic:   CNII-XII intact, normal strength, sensation and reflexes    throughout      Lab Review Urine pregnancy test Labs reviewed yes Radiologic studies reviewed no Assessment:    Pregnancy at Unknown weeks    Plan:    Ultrasound ordered for dating.  Prenatal vitamins.  Counseling provided regarding continued use of seat belts, cessation of alcohol consumption, smoking or use of illicit drugs; infection precautions i.e., influenza/TDAP immunizations, toxoplasmosis,CMV, parvovirus, listeria and varicella; workplace safety, exercise during pregnancy; routine dental care, safe medications, sexual activity, hot tubs, saunas, pools, travel, caffeine use, fish and methlymercury, potential toxins,  hair treatments, varicose veins Weight gain recommendations per IOM guidelines reviewed: underweight/BMI< 18.5--> gain 28 - 40 lbs; normal weight/BMI 18.5 - 24.9--> gain 25 - 35 lbs; overweight/BMI 25 - 29.9--> gain 15 - 25 lbs; obese/BMI >30->gain  11 - 20 lbs Problem list reviewed and updated. FIRST/CF mutation testing/NIPT/QUAD SCREEN/fragile X/Ashkenazi Jewish population testing/Spinal muscular atrophy discussed: requested. Role of ultrasound in pregnancy  discussed; fetal survey: requested. Amniocentesis discussed: not indicated. VBAC calculator score: VBAC consent form provided No orders of the defined types were placed in this encounter.   Orders Placed This Encounter  Procedures  . POCT urinalysis dipstick    Follow up in 4 weeks.

## 2016-01-15 ENCOUNTER — Encounter: Payer: Self-pay | Admitting: *Deleted

## 2016-01-23 ENCOUNTER — Ambulatory Visit (HOSPITAL_COMMUNITY)
Admission: RE | Admit: 2016-01-23 | Discharge: 2016-01-23 | Disposition: A | Discharge status: Home / Self Care | Payer: Managed Care, Other (non HMO) | Source: Ambulatory Visit | Attending: Obstetrics | Admitting: Obstetrics

## 2016-01-23 ENCOUNTER — Encounter: Payer: Self-pay | Admitting: Obstetrics

## 2016-01-23 ENCOUNTER — Ambulatory Visit (INDEPENDENT_AMBULATORY_CARE_PROVIDER_SITE_OTHER): Payer: Managed Care, Other (non HMO) | Admitting: Obstetrics

## 2016-01-23 VITALS — BP 109/73 | HR 72 | Wt 134.0 lb

## 2016-01-23 DIAGNOSIS — Z3491 Encounter for supervision of normal pregnancy, unspecified, first trimester: Secondary | ICD-10-CM

## 2016-01-23 DIAGNOSIS — Z36 Encounter for antenatal screening of mother: Secondary | ICD-10-CM | POA: Diagnosis not present

## 2016-01-23 DIAGNOSIS — Z3A09 9 weeks gestation of pregnancy: Secondary | ICD-10-CM | POA: Insufficient documentation

## 2016-01-23 DIAGNOSIS — Z3401 Encounter for supervision of normal first pregnancy, first trimester: Secondary | ICD-10-CM

## 2016-01-23 NOTE — Progress Notes (Signed)
  Subjective:    Brandi Henson is a 20 y.o. female being seen today for her obstetrical visit. She is at 1717w1d gestation. Patient reports: no complaints.  Problem List Items Addressed This Visit    None     There are no active problems to display for this patient.   Objective:     BP 109/73 mmHg  Pulse 72  Wt 134 lb (60.782 kg)  LMP 10/26/2015 (Exact Date) Uterine Size: Below umbilicus     Assessment:    Pregnancy @ 6417w1d  weeks Doing well    Plan:    Problem list reviewed and updated. Labs reviewed.  Follow up in 4 weeks. FIRST/CF mutation testing/NIPT/QUAD SCREEN/fragile X/Ashkenazi Jewish population testing/Spinal muscular atrophy discussed: requested. Role of ultrasound in pregnancy discussed; fetal survey: requested. Amniocentesis discussed: not indicated.

## 2016-02-06 ENCOUNTER — Ambulatory Visit (INDEPENDENT_AMBULATORY_CARE_PROVIDER_SITE_OTHER): Payer: Managed Care, Other (non HMO) | Admitting: Obstetrics

## 2016-02-06 ENCOUNTER — Encounter: Payer: Self-pay | Admitting: Obstetrics

## 2016-02-06 VITALS — BP 120/76 | HR 72 | Temp 98.4°F | Wt 135.0 lb

## 2016-02-06 DIAGNOSIS — Z331 Pregnant state, incidental: Secondary | ICD-10-CM | POA: Diagnosis not present

## 2016-02-06 DIAGNOSIS — Z3491 Encounter for supervision of normal pregnancy, unspecified, first trimester: Secondary | ICD-10-CM

## 2016-02-06 DIAGNOSIS — Z1389 Encounter for screening for other disorder: Secondary | ICD-10-CM | POA: Diagnosis not present

## 2016-02-06 LAB — POCT URINALYSIS DIPSTICK
Bilirubin, UA: NEGATIVE
Blood, UA: NEGATIVE
GLUCOSE UA: NEGATIVE
Ketones, UA: NEGATIVE
Nitrite, UA: NEGATIVE
SPEC GRAV UA: 1.025
Urobilinogen, UA: NEGATIVE

## 2016-02-06 NOTE — Progress Notes (Signed)
Patient reports she is doing well 

## 2016-02-06 NOTE — Progress Notes (Signed)
  Subjective:    Brandi Henson is a 20 y.o. female being seen today for her obstetrical visit. She is at 2586w1d gestation. Patient reports: no complaints.  Problem List Items Addressed This Visit    None    Visit Diagnoses    Prenatal care, first trimester    -  Primary    Relevant Orders    POCT urinalysis dipstick (Completed)      There are no active problems to display for this patient.   Objective:     BP 120/76 mmHg  Pulse 72  Temp(Src) 98.4 F (36.9 C)  Wt 135 lb (61.236 kg)  LMP 10/26/2015 (Exact Date) Uterine Size: Below umbilicus     Assessment:    Pregnancy @ 6086w1d  weeks Doing well    Plan:    Problem list reviewed and updated. Labs reviewed.  Follow up in 4 weeks. FIRST/CF mutation testing/NIPT/QUAD SCREEN/fragile X/Ashkenazi Jewish population testing/Spinal muscular atrophy discussed: requested. Role of ultrasound in pregnancy discussed; fetal survey: requested. Amniocentesis discussed: not indicated.

## 2016-03-05 ENCOUNTER — Encounter: Payer: Self-pay | Admitting: Obstetrics

## 2016-03-05 ENCOUNTER — Ambulatory Visit (INDEPENDENT_AMBULATORY_CARE_PROVIDER_SITE_OTHER): Payer: Managed Care, Other (non HMO) | Admitting: Obstetrics

## 2016-03-05 VITALS — BP 118/85 | HR 86 | Wt 139.0 lb

## 2016-03-05 DIAGNOSIS — Z3492 Encounter for supervision of normal pregnancy, unspecified, second trimester: Secondary | ICD-10-CM | POA: Diagnosis not present

## 2016-03-05 NOTE — Progress Notes (Signed)
Patient ID: Brandi Henson, female   DOB: 05-12-1996, 20 y.o.   MRN: [id number]  Subjective:    Brandi Henson is a 20 y.o. female being seen today for her obstetrical visit. She is at 235w1d gestation. Patient reports: no complaints.  Problem List Items Addressed This Visit    None    Visit Diagnoses   None.    There are no active problems to display for this patient.   Objective:     BP 118/85   Pulse 86   Wt 139 lb (63 kg)   LMP 10/26/2015 (Exact Date)   BMI 19.39 kg/m  Uterine Size: Below umbilicus     Assessment:    Pregnancy @ 2935w1d  weeks Doing well    Plan:    Problem list reviewed and updated. Labs reviewed.  Follow up in 4 weeks. FIRST/CF mutation testing/NIPT/QUAD SCREEN/fragile X/Ashkenazi Jewish population testing/Spinal muscular atrophy discussed: requested. Role of ultrasound in pregnancy discussed; fetal survey: requested. Amniocentesis discussed: not indicated.

## 2016-03-31 ENCOUNTER — Ambulatory Visit (INDEPENDENT_AMBULATORY_CARE_PROVIDER_SITE_OTHER): Payer: Managed Care, Other (non HMO) | Admitting: Obstetrics

## 2016-03-31 ENCOUNTER — Encounter: Payer: Self-pay | Admitting: Obstetrics

## 2016-03-31 ENCOUNTER — Ambulatory Visit (INDEPENDENT_AMBULATORY_CARE_PROVIDER_SITE_OTHER): Payer: Managed Care, Other (non HMO)

## 2016-03-31 VITALS — BP 121/78 | HR 98 | Wt 140.0 lb

## 2016-03-31 DIAGNOSIS — Z1389 Encounter for screening for other disorder: Secondary | ICD-10-CM

## 2016-03-31 DIAGNOSIS — Z36 Encounter for antenatal screening of mother: Secondary | ICD-10-CM

## 2016-03-31 DIAGNOSIS — Z331 Pregnant state, incidental: Secondary | ICD-10-CM

## 2016-03-31 DIAGNOSIS — Z3492 Encounter for supervision of normal pregnancy, unspecified, second trimester: Secondary | ICD-10-CM

## 2016-03-31 DIAGNOSIS — Z3402 Encounter for supervision of normal first pregnancy, second trimester: Secondary | ICD-10-CM

## 2016-03-31 LAB — POCT URINALYSIS DIPSTICK
Bilirubin, UA: NEGATIVE
GLUCOSE UA: NEGATIVE
Ketones, UA: NEGATIVE
Leukocytes, UA: NEGATIVE
NITRITE UA: NEGATIVE
PH UA: 6.5
PROTEIN UA: NEGATIVE
RBC UA: NEGATIVE
Spec Grav, UA: 1.015
UROBILINOGEN UA: 1

## 2016-03-31 MED ORDER — VITAFOL GUMMIES 3.33-0.333-34.8 MG PO CHEW: 3.0000 | CHEWABLE_TABLET | Freq: Every day | ORAL | 11 refills | Status: DC

## 2016-03-31 NOTE — Progress Notes (Signed)
Patient ID: Brandi Henson, female   DOB: 1995-11-15, 20 y.o.   MRN: [id number]  Subjective:    Brandi Henson is a 20 y.o. female being seen today for her obstetrical visit. She is at 7957w6d gestation. Patient reports: no complaints.  Problem List Items Addressed This Visit    None    Visit Diagnoses    Encounter for supervision of normal first pregnancy in second trimester    -  Primary   Relevant Orders   POCT urinalysis dipstick (Completed)     There are no active problems to display for this patient.   Objective:     BP 121/78   Pulse 98   Wt 140 lb (63.5 kg)   LMP 10/26/2015 (Exact Date)   BMI 19.53 kg/m  Uterine Size: Below umbilicus     Assessment:    Pregnancy @ 1657w6d  weeks Doing well    Plan:    Problem list reviewed and updated. Labs reviewed.  Follow up in 4 weeks. FIRST/CF mutation testing/NIPT/QUAD SCREEN/fragile X/Ashkenazi Jewish population testing/Spinal muscular atrophy discussed: requested. Role of ultrasound in pregnancy discussed; fetal survey: requested. Amniocentesis discussed: not indicated. 50% of 15 minute visit spent on counseling and coordination of care.

## 2016-04-28 ENCOUNTER — Ambulatory Visit (INDEPENDENT_AMBULATORY_CARE_PROVIDER_SITE_OTHER): Payer: Managed Care, Other (non HMO) | Admitting: Obstetrics

## 2016-04-28 VITALS — BP 127/77 | HR 95 | Temp 99.0°F | Wt 144.6 lb

## 2016-04-28 DIAGNOSIS — Z3402 Encounter for supervision of normal first pregnancy, second trimester: Secondary | ICD-10-CM

## 2016-04-28 NOTE — Progress Notes (Signed)
Patient states that she is feeling well, with good fetal movement.

## 2016-05-04 ENCOUNTER — Encounter: Payer: Self-pay | Admitting: Obstetrics

## 2016-05-04 NOTE — Progress Notes (Signed)
Subjective:    Brandi Henson is a 20 y.o. female being seen today for her obstetrical visit. She is at 834w5d gestation. Patient reports: no complaints . Fetal movement: normal.  Problem List Items Addressed This Visit    None    Visit Diagnoses    Encounter for supervision of normal first pregnancy in second trimester    -  Primary     There are no active problems to display for this patient.  Objective:    BP 127/77   Pulse 95   Temp 99 F (37.2 C)   Wt 144 lb 9.6 oz (65.6 kg)   LMP 10/26/2015 (Exact Date)   BMI 20.17 kg/m  FHT: 150 BPM  Uterine Size: size equals dates     Assessment:    Pregnancy @ 4934w5d    Plan:    Signs and symptoms of preterm labor: discussed.  Labs, problem list reviewed and updated 2 hr GTT planned Follow up in 4 weeks.  Patient ID: Brandi Henson, female   DOB: 1996-06-21, 20 y.o.   MRN: [id number]

## 2016-05-26 ENCOUNTER — Encounter: Payer: Self-pay | Admitting: Obstetrics

## 2016-05-26 ENCOUNTER — Ambulatory Visit (INDEPENDENT_AMBULATORY_CARE_PROVIDER_SITE_OTHER): Payer: Managed Care, Other (non HMO) | Admitting: Obstetrics

## 2016-05-26 VITALS — BP 112/69 | HR 92 | Wt 150.9 lb

## 2016-05-26 DIAGNOSIS — Z3493 Encounter for supervision of normal pregnancy, unspecified, third trimester: Secondary | ICD-10-CM | POA: Diagnosis not present

## 2016-05-26 DIAGNOSIS — Z23 Encounter for immunization: Secondary | ICD-10-CM | POA: Diagnosis not present

## 2016-05-26 DIAGNOSIS — Z124 Encounter for screening for malignant neoplasm of cervix: Secondary | ICD-10-CM | POA: Diagnosis not present

## 2016-05-26 DIAGNOSIS — Z3480 Encounter for supervision of other normal pregnancy, unspecified trimester: Secondary | ICD-10-CM

## 2016-05-26 DIAGNOSIS — Z3490 Encounter for supervision of normal pregnancy, unspecified, unspecified trimester: Secondary | ICD-10-CM | POA: Insufficient documentation

## 2016-05-26 NOTE — Progress Notes (Signed)
Subjective:    Rudy Jewlexis Brunette is a 20 y.o. female being seen today for her obstetrical visit. She is at 6471w6d gestation. Patient reports: no complaints . Fetal movement: normal.  Problem List Items Addressed This Visit    Supervision of low-risk pregnancy   Relevant Orders   Cytology - PAP   HIV antibody   Hemoglobinopathy evaluation   Varicella zoster antibody, IgG   Culture, OB Urine   Prenatal Profile I   NuSwab VG+, Candida 6sp   ToxASSURE Select 13 (MW), Urine   Flu Vaccine QUAD 36+ mos IM (Fluarix, Quad PF) (Completed)    Other Visit Diagnoses   None.    Patient Active Problem List   Diagnosis Date Noted  . Supervision of low-risk pregnancy 05/26/2016   Objective:    BP 112/69   Pulse 92   Wt 150 lb 14.4 oz (68.4 kg)   LMP 10/26/2015 (Exact Date)   BMI 21.05 kg/m  FHT: 150 BPM  Uterine Size: size equals dates     Assessment:    Pregnancy @ 7471w6d    Plan:    OBGCT: ordered for next visit. Signs and symptoms of preterm labor: discussed.  Labs, problem list reviewed and updated 2 hr GTT planned Follow up in 2 weeks.  Patient ID: Rudy JewAlexis Veazie, female   DOB: 26-Sep-1995, 20 y.o.   MRN: [id number]

## 2016-05-26 NOTE — Progress Notes (Signed)
Patient reports she is doing well today- no concerns.

## 2016-05-27 LAB — CYTOLOGY - PAP: Diagnosis: NEGATIVE

## 2016-05-28 LAB — HEMOGLOBINOPATHY EVALUATION
HGB C: 0 %
HGB S: 0 %
Hemoglobin A2 Quantitation: 2.6 % (ref 0.7–3.1)
Hemoglobin F Quantitation: 0 % (ref 0.0–2.0)
Hgb A: 97.4 % (ref 94.0–98.0)

## 2016-05-28 LAB — PRENATAL PROFILE I(LABCORP)
ANTIBODY SCREEN: NEGATIVE
BASOS ABS: 0 10*3/uL (ref 0.0–0.2)
Basos: 0 %
EOS (ABSOLUTE): 0.1 10*3/uL (ref 0.0–0.4)
EOS: 1 %
HEMATOCRIT: 33.1 % — AB (ref 34.0–46.6)
HEP B S AG: NEGATIVE
Hemoglobin: 11.3 g/dL (ref 11.1–15.9)
IMMATURE GRANULOCYTES: 1 %
Immature Grans (Abs): 0.1 10*3/uL (ref 0.0–0.1)
Lymphocytes Absolute: 1.5 10*3/uL (ref 0.7–3.1)
Lymphs: 13 %
MCH: 30.5 pg (ref 26.6–33.0)
MCHC: 34.1 g/dL (ref 31.5–35.7)
MCV: 89 fL (ref 79–97)
MONOCYTES: 7 %
Monocytes Absolute: 0.8 10*3/uL (ref 0.1–0.9)
NEUTROS ABS: 9.1 10*3/uL — AB (ref 1.4–7.0)
Neutrophils: 78 %
PLATELETS: 271 10*3/uL (ref 150–379)
RBC: 3.71 x10E6/uL — ABNORMAL LOW (ref 3.77–5.28)
RDW: 13.4 % (ref 12.3–15.4)
RH TYPE: POSITIVE
RPR Ser Ql: NONREACTIVE
RUBELLA: 3.24 {index} (ref >0.99)
WBC: 11.6 10*3/uL — AB (ref 3.4–10.8)

## 2016-05-28 LAB — HIV ANTIBODY (ROUTINE TESTING W REFLEX): HIV Screen 4th Generation wRfx: NONREACTIVE

## 2016-05-28 LAB — VARICELLA ZOSTER ANTIBODY, IGG: VARICELLA: 259 {index} (ref >165)

## 2016-06-02 ENCOUNTER — Other Ambulatory Visit: Payer: Self-pay | Admitting: Obstetrics

## 2016-06-02 DIAGNOSIS — B373 Candidiasis of vulva and vagina: Secondary | ICD-10-CM

## 2016-06-02 DIAGNOSIS — N760 Acute vaginitis: Principal | ICD-10-CM

## 2016-06-02 DIAGNOSIS — B3731 Acute candidiasis of vulva and vagina: Secondary | ICD-10-CM

## 2016-06-02 DIAGNOSIS — B9689 Other specified bacterial agents as the cause of diseases classified elsewhere: Secondary | ICD-10-CM

## 2016-06-02 LAB — NUSWAB VG+, CANDIDA 6SP
CANDIDA ALBICANS, NAA: POSITIVE — AB
CANDIDA GLABRATA, NAA: NEGATIVE
CANDIDA LUSITANIAE, NAA: NEGATIVE
CANDIDA PARAPSILOSIS, NAA: NEGATIVE
CANDIDA TROPICALIS, NAA: NEGATIVE
Candida krusei, NAA: NEGATIVE
Chlamydia trachomatis, NAA: NEGATIVE
Megasphaera 1: HIGH Score — AB
Neisseria gonorrhoeae, NAA: NEGATIVE
Trich vag by NAA: NEGATIVE

## 2016-06-02 MED ORDER — FLUCONAZOLE 150 MG PO TABS: 150.0000 mg | ORAL_TABLET | Freq: Once | ORAL | 0 refills | Status: AC

## 2016-06-02 MED ORDER — METRONIDAZOLE 500 MG PO TABS: 500.0000 mg | ORAL_TABLET | Freq: Two times a day (BID) | ORAL | 2 refills | Status: DC

## 2016-06-09 ENCOUNTER — Encounter: Payer: Self-pay | Admitting: Obstetrics

## 2016-06-09 ENCOUNTER — Ambulatory Visit (INDEPENDENT_AMBULATORY_CARE_PROVIDER_SITE_OTHER): Payer: Managed Care, Other (non HMO) | Admitting: Obstetrics

## 2016-06-09 VITALS — BP 113/72 | HR 105 | Temp 98.3°F | Wt 158.3 lb

## 2016-06-09 DIAGNOSIS — Z3493 Encounter for supervision of normal pregnancy, unspecified, third trimester: Secondary | ICD-10-CM

## 2016-06-09 MED ORDER — TETANUS-DIPHTH-ACELL PERTUSSIS 5-2.5-18.5 LF-MCG/0.5 IM SUSP
0.5000 mL | Freq: Once | INTRAMUSCULAR | Status: AC
  Administered 2016-06-09: 0.5 mL via INTRAMUSCULAR

## 2016-06-09 NOTE — Progress Notes (Signed)
Subjective:    Brandi Henson is a 20 y.o. female being seen today for her obstetrical visit. She is at 526w6d gestation. Patient reports no complaints. Fetal movement: normal.  Problem List Items Addressed This Visit    None     Patient Active Problem List   Diagnosis Date Noted  . Supervision of low-risk pregnancy 05/26/2016   Objective:    BP 113/72   Pulse (!) 105   Temp 98.3 F (36.8 C)   Wt 158 lb 4.8 oz (71.8 kg)   LMP 10/26/2015 (Exact Date)   BMI 22.08 kg/m  FHT:  15;0 BPM  Uterine Size: size equals dates  Presentation: unsure     Assessment:    Pregnancy @ 176w6d weeks   Plan:     labs reviewed, problem list updated Consent signed. GBS sent TDAP offered  Rhogam given for RH negative Pediatrician: discussed. Infant feeding: plans to breastfeed. Maternity leave: discussed. Cigarette smoking: never smoked. No orders of the defined types were placed in this encounter.  No orders of the defined types were placed in this encounter.  Follow up in 2 Weeks.

## 2016-06-09 NOTE — Addendum Note (Signed)
Addended by: Francene FindersJAMES, QUINETTA C on: 06/09/2016 10:09 AM   Modules accepted: Orders

## 2016-06-09 NOTE — Addendum Note (Signed)
Addended by: Francene FindersJAMES, QUINETTA C on: 06/09/2016 09:51 AM   Modules accepted: Orders

## 2016-06-10 LAB — CBC
HEMATOCRIT: 32.6 % — AB (ref 34.0–46.6)
Hemoglobin: 10.7 g/dL — ABNORMAL LOW (ref 11.1–15.9)
MCH: 30.1 pg (ref 26.6–33.0)
MCHC: 32.8 g/dL (ref 31.5–35.7)
MCV: 92 fL (ref 79–97)
Platelets: 261 10*3/uL (ref 150–379)
RBC: 3.56 x10E6/uL — ABNORMAL LOW (ref 3.77–5.28)
RDW: 13.4 % (ref 12.3–15.4)
WBC: 11.6 10*3/uL — AB (ref 3.4–10.8)

## 2016-06-10 LAB — RPR: RPR: NONREACTIVE

## 2016-06-10 LAB — GLUCOSE TOLERANCE, 2 HOURS W/ 1HR
GLUCOSE, 2 HOUR: 97 mg/dL (ref 65–152)
Glucose, 1 hour: 106 mg/dL (ref 65–179)
Glucose, Fasting: 99 mg/dL — ABNORMAL HIGH (ref 65–91)

## 2016-06-10 LAB — HIV ANTIBODY (ROUTINE TESTING W REFLEX): HIV SCREEN 4TH GENERATION: NONREACTIVE

## 2016-06-23 ENCOUNTER — Institutional Professional Consult (permissible substitution): Payer: Self-pay | Admitting: Pediatrics

## 2016-06-26 ENCOUNTER — Ambulatory Visit (INDEPENDENT_AMBULATORY_CARE_PROVIDER_SITE_OTHER): Payer: Managed Care, Other (non HMO) | Admitting: Obstetrics

## 2016-06-26 VITALS — BP 111/75 | HR 94 | Wt 158.4 lb

## 2016-06-26 DIAGNOSIS — Z3493 Encounter for supervision of normal pregnancy, unspecified, third trimester: Secondary | ICD-10-CM

## 2016-06-30 ENCOUNTER — Encounter: Payer: Self-pay | Admitting: Obstetrics

## 2016-06-30 NOTE — Progress Notes (Signed)
Subjective:    Brandi Henson is a 20 y.o. female being seen today for her obstetrical visit. She is at 6264w6d gestation. Patient reports no complaints. Fetal movement: normal.  Problem List Items Addressed This Visit    Supervision of low-risk pregnancy     Patient Active Problem List   Diagnosis Date Noted  . Supervision of low-risk pregnancy 05/26/2016   Objective:    BP 111/75   Pulse 94   Wt 158 lb 6.4 oz (71.8 kg)   LMP 10/26/2015 (Exact Date)   BMI 22.09 kg/m  FHT:  150 BPM  Uterine Size: size equals dates  Presentation: unsure     Assessment:    Pregnancy @ 7664w6d weeks   Plan:     labs reviewed, problem list updated Consent signed. GBS sent TDAP offered  Rhogam given for RH negative Pediatrician: discussed. Infant feeding: plans to breastfeed. Maternity leave: discussed. Cigarette smoking: never smoked. No orders of the defined types were placed in this encounter.  No orders of the defined types were placed in this encounter.  Follow up in 2 Weeks.

## 2016-07-08 ENCOUNTER — Encounter (HOSPITAL_COMMUNITY): Payer: Self-pay | Admitting: *Deleted

## 2016-07-08 ENCOUNTER — Inpatient Hospital Stay (HOSPITAL_COMMUNITY)
Admission: AD | Admit: 2016-07-08 | Discharge: 2016-07-08 | Disposition: A | Discharge status: Home / Self Care | Payer: Managed Care, Other (non HMO) | Source: Ambulatory Visit | Attending: Family Medicine | Admitting: Family Medicine

## 2016-07-08 DIAGNOSIS — O4703 False labor before 37 completed weeks of gestation, third trimester: Secondary | ICD-10-CM | POA: Diagnosis not present

## 2016-07-08 DIAGNOSIS — Z3A33 33 weeks gestation of pregnancy: Secondary | ICD-10-CM | POA: Diagnosis not present

## 2016-07-08 DIAGNOSIS — R1030 Lower abdominal pain, unspecified: Secondary | ICD-10-CM | POA: Insufficient documentation

## 2016-07-08 DIAGNOSIS — O26893 Other specified pregnancy related conditions, third trimester: Secondary | ICD-10-CM | POA: Diagnosis not present

## 2016-07-08 DIAGNOSIS — R109 Unspecified abdominal pain: Secondary | ICD-10-CM

## 2016-07-08 DIAGNOSIS — O26899 Other specified pregnancy related conditions, unspecified trimester: Secondary | ICD-10-CM

## 2016-07-08 DIAGNOSIS — R42 Dizziness and giddiness: Secondary | ICD-10-CM

## 2016-07-08 DIAGNOSIS — R55 Syncope and collapse: Secondary | ICD-10-CM | POA: Insufficient documentation

## 2016-07-08 LAB — URINALYSIS, ROUTINE W REFLEX MICROSCOPIC
Bilirubin Urine: NEGATIVE
GLUCOSE, UA: NEGATIVE mg/dL
HGB URINE DIPSTICK: NEGATIVE
Ketones, ur: NEGATIVE mg/dL
NITRITE: NEGATIVE
PROTEIN: NEGATIVE mg/dL
SPECIFIC GRAVITY, URINE: 1.013 (ref 1.005–1.030)
pH: 6 (ref 5.0–8.0)

## 2016-07-08 LAB — CBC
HEMATOCRIT: 29.4 % — AB (ref 36.0–46.0)
Hemoglobin: 10 g/dL — ABNORMAL LOW (ref 12.0–15.0)
MCH: 30.1 pg (ref 26.0–34.0)
MCHC: 34 g/dL (ref 30.0–36.0)
MCV: 88.6 fL (ref 78.0–100.0)
PLATELETS: 208 10*3/uL (ref 150–400)
RBC: 3.32 MIL/uL — ABNORMAL LOW (ref 3.87–5.11)
RDW: 13.1 % (ref 11.5–15.5)
WBC: 12.7 10*3/uL — AB (ref 4.0–10.5)

## 2016-07-08 LAB — COMPREHENSIVE METABOLIC PANEL
ALT: 10 U/L — AB (ref 14–54)
ANION GAP: 8 (ref 5–15)
AST: 14 U/L — ABNORMAL LOW (ref 15–41)
Albumin: 3 g/dL — ABNORMAL LOW (ref 3.5–5.0)
Alkaline Phosphatase: 113 U/L (ref 38–126)
BUN: 7 mg/dL (ref 6–20)
CHLORIDE: 108 mmol/L (ref 101–111)
CO2: 20 mmol/L — AB (ref 22–32)
Calcium: 8.3 mg/dL — ABNORMAL LOW (ref 8.9–10.3)
Creatinine, Ser: 0.51 mg/dL (ref 0.44–1.00)
GFR calc non Af Amer: >60 mL/min (ref >60)
Glucose, Bld: 68 mg/dL (ref 65–99)
Potassium: 3.7 mmol/L (ref 3.5–5.1)
SODIUM: 136 mmol/L (ref 135–145)
Total Bilirubin: 0.9 mg/dL (ref 0.3–1.2)
Total Protein: 6.7 g/dL (ref 6.5–8.1)

## 2016-07-08 LAB — FETAL FIBRONECTIN: Fetal Fibronectin: NEGATIVE

## 2016-07-08 NOTE — Discharge Instructions (Signed)
Braxton Hicks Contractions °Contractions of the uterus can occur throughout pregnancy. Contractions are not always a sign that you are in labor.  °WHAT ARE BRAXTON HICKS CONTRACTIONS?  °Contractions that occur before labor are called Braxton Hicks contractions, or false labor. Toward the end of pregnancy (32-34 weeks), these contractions can develop more often and may become more forceful. This is not true labor because these contractions do not result in opening (dilatation) and thinning of the cervix. They are sometimes difficult to tell apart from true labor because these contractions can be forceful and people have different pain tolerances. You should not feel embarrassed if you go to the hospital with false labor. Sometimes, the only way to tell if you are in true labor is for your health care provider to look for changes in the cervix. °If there are no prenatal problems or other health problems associated with the pregnancy, it is completely safe to be sent home with false labor and await the onset of true labor. °HOW CAN YOU TELL THE DIFFERENCE BETWEEN TRUE AND FALSE LABOR? °False Labor  °· The contractions of false labor are usually shorter and not as hard as those of true labor.   °· The contractions are usually irregular.   °· The contractions are often felt in the front of the lower abdomen and in the groin.   °· The contractions may go away when you walk around or change positions while lying down.   °· The contractions get weaker and are shorter lasting as time goes on.   °· The contractions do not usually become progressively stronger, regular, and closer together as with true labor.   °True Labor  °· Contractions in true labor last 30-70 seconds, become very regular, usually become more intense, and increase in frequency.   °· The contractions do not go away with walking.   °· The discomfort is usually felt in the top of the uterus and spreads to the lower abdomen and low back.   °· True labor can be  determined by your health care provider with an exam. This will show that the cervix is dilating and getting thinner.   °WHAT TO REMEMBER °· Keep up with your usual exercises and follow other instructions given by your health care provider.   °· Take medicines as directed by your health care provider.   °· Keep your regular prenatal appointments.   °· Eat and drink lightly if you think you are going into labor.   °· If Braxton Hicks contractions are making you uncomfortable:   °¨ Change your position from lying down or resting to walking, or from walking to resting.   °¨ Sit and rest in a tub of warm water.   °¨ Drink 2-3 glasses of water. Dehydration may cause these contractions.   °¨ Do slow and deep breathing several times an hour.   °WHEN SHOULD I SEEK IMMEDIATE MEDICAL CARE? °Seek immediate medical care if: °· Your contractions become stronger, more regular, and closer together.   °· You have fluid leaking or gushing from your vagina.   °· You have a fever.   °· You pass blood-tinged mucus.   °· You have vaginal bleeding.   °· You have continuous abdominal pain.   °· You have low back pain that you never had before.   °· You feel your baby's head pushing down and causing pelvic pressure.   °· Your baby is not moving as much as it used to.   °This information is not intended to replace advice given to you by your health care provider. Make sure you discuss any questions you have with your health care   provider. °Document Released: 07/13/2005 Document Revised: 11/04/2015 Document Reviewed: 04/24/2013 °Elsevier Interactive Patient Education © 2017 [employer] ° °

## 2016-07-08 NOTE — MAU Provider Note (Signed)
History     CSN: [id number]  Arrival date and time: 07/08/16 1536   None     Chief Complaint  Patient presents with  . Abdominal Pain  . Dizziness   Around 6am, patient experienced sudden onset of sharp lower abdominal pain. Since then, pain has been intermittent.10/10 at its worst. No radiation. Around noon, she was sitting at work and felt lightheaded. She had been eating and drinking adequately. No N/V/D. Denies dysuria and constipation. No reports of fall or trauma. Patient denies feeling unsafe at home or experiencing any intimate partner violence.    OB History    Gravida Para Term Preterm AB Living   1             SAB TAB Ectopic Multiple Live Births                  Past Medical History:  Diagnosis Date  . Preterm labor     Past Surgical History:  Procedure Laterality Date  . NO PAST SURGERIES      Family History  Problem Relation Age of Onset  . Diabetes Maternal Grandfather     Social History  Substance Use Topics  . Smoking status: Never Smoker  . Smokeless tobacco: Never Used  . Alcohol use No    Allergies: No Known Allergies  Prescriptions Prior to Admission  Medication Sig Dispense Refill Last Dose  . metoCLOPramide (REGLAN) 10 MG tablet Take 1 tablet (10 mg total) by mouth every 6 (six) hours as needed for nausea or vomiting. 30 tablet 0 07/08/2016 at Unknown time  . Prenatal Vit-Fe Phos-FA-Omega (VITAFOL GUMMIES) 3.33-0.333-34.8 MG CHEW Chew 3 each by mouth daily.   07/08/2016 at Unknown time    Review of Systems  Constitutional: Negative for fever.  Respiratory: Negative for shortness of breath.   Cardiovascular: Negative for chest pain.  Gastrointestinal: Positive for abdominal pain. Negative for constipation, diarrhea, nausea and vomiting.  Genitourinary: Positive for frequency. Negative for dysuria and urgency.   Physical Exam   Blood pressure 128/84, pulse 92, temperature 98.7 F (37.1 C), temperature source Oral, resp. rate 18,  last menstrual period 10/26/2015.  Physical Exam  Constitutional: She is oriented to person, place, and time. She appears well-developed and well-nourished.  Cardiovascular: Normal rate, regular rhythm and normal heart sounds.   Respiratory: Effort normal and breath sounds normal.  GI: Soft. Bowel sounds are normal. She exhibits no distension. There is no tenderness. There is no rebound and no guarding.  Genitourinary:  Genitourinary Comments: SVE: 1/thick/high  Musculoskeletal: Normal range of motion.  Neurological: She is alert and oriented to person, place, and time.  Skin: Skin is warm and dry.    MAU Course  Procedures  MDM FFN pending Cervical Check UA neg  Assessment and Plan  Patient is a 20 year old G1P0 at 33w who presents for lower abdominal pain and near syncope. Here for observation and rule-out of preterm labor. FFN is pending. Will get CBC to rule out anemia, and CMP to check electrolytes and liver function.  Clearance Cootsndrew Tyson 07/08/2016, 5:10 PM   Midwife attestation:  I have seen and examined this patient; I agree with above documentation in the resident's note.   Brandi Henson is a 20 y.o. G1P0 reporting single episode of abdominal sharp pain and dizziness that spontaneously resolved.  Pt does report mild lower abdominal cramping that continues.  She has tried drinking more fluids but it has not helped.  +FM, denies LOF, VB,  vaginal discharge.  PE: BP 138/77 (BP Location: Right Arm)   Pulse 101   Temp 98.7 F (37.1 C) (Oral)   Resp 17   LMP 10/26/2015 (Exact Date)  Gen: calm comfortable, NAD Resp: normal effort, no distress Abd: gravid  Dilation: 1 Effacement (%): Thick Station: -3 Exam by:: L Leftwich-Kirby CNM  ROS, labs, PMH reviewed NST Category I  Results for orders placed or performed during the hospital encounter of 07/08/16 (from the past 168 hour(s))  Urinalysis, Routine w reflex microscopic   Collection Time: 07/08/16  4:03 PM  Result  Value Ref Range   Color, Urine YELLOW YELLOW   APPearance CLEAR CLEAR   Specific Gravity, Urine 1.013 1.005 - 1.030   pH 6.0 5.0 - 8.0   Glucose, UA NEGATIVE NEGATIVE mg/dL   Hgb urine dipstick NEGATIVE NEGATIVE   Bilirubin Urine NEGATIVE NEGATIVE   Ketones, ur NEGATIVE NEGATIVE mg/dL   Protein, ur NEGATIVE NEGATIVE mg/dL   Nitrite NEGATIVE NEGATIVE   Leukocytes, UA SMALL (A) NEGATIVE   RBC / HPF 0-5 0 - 5 RBC/hpf   WBC, UA 6-30 0 - 5 WBC/hpf   Bacteria, UA RARE (A) NONE SEEN   Squamous Epithelial / LPF 0-5 (A) NONE SEEN   Mucous PRESENT   Fetal fibronectin   Collection Time: 07/08/16  6:05 PM  Result Value Ref Range   Fetal Fibronectin NEGATIVE NEGATIVE  CBC   Collection Time: 07/08/16  6:24 PM  Result Value Ref Range   WBC 12.7 (H) 4.0 - 10.5 K/uL   RBC 3.32 (L) 3.87 - 5.11 MIL/uL   Hemoglobin 10.0 (L) 12.0 - 15.0 g/dL   HCT 95.629.4 (L) 21.336.0 - 08.646.0 %   MCV 88.6 78.0 - 100.0 fL   MCH 30.1 26.0 - 34.0 pg   MCHC 34.0 30.0 - 36.0 g/dL   RDW 57.813.1 46.911.5 - 62.915.5 %   Platelets 208 150 - 400 K/uL  Comprehensive metabolic panel   Collection Time: 07/08/16  6:24 PM  Result Value Ref Range   Sodium 136 135 - 145 mmol/L   Potassium 3.7 3.5 - 5.1 mmol/L   Chloride 108 101 - 111 mmol/L   CO2 20 (L) 22 - 32 mmol/L   Glucose, Bld 68 65 - 99 mg/dL   BUN 7 6 - 20 mg/dL   Creatinine, Ser 5.280.51 0.44 - 1.00 mg/dL   Calcium 8.3 (L) 8.9 - 10.3 mg/dL   Total Protein 6.7 6.5 - 8.1 g/dL   Albumin 3.0 (L) 3.5 - 5.0 g/dL   AST 14 (L) 15 - 41 U/L   ALT 10 (L) 14 - 54 U/L   Alkaline Phosphatase 113 38 - 126 U/L   Total Bilirubin 0.9 0.3 - 1.2 mg/dL   GFR calc non Af Amer >60 >60 mL/min   GFR calc Af Amer >60 >60 mL/min   Anion gap 8 5 - 15    A/P: FFN negative. Cervix 1 cm/thick Preterm labor precautions reviewed [facility] [place]  Sharen CounterLisa Leftwich-Kirby, CNM  8:43 PM

## 2016-07-08 NOTE — MAU Note (Signed)
Pt C/O sharp lower abd pain since this morning, then started feeling lightheaded.  Denies bleeding or LOF.

## 2016-07-14 ENCOUNTER — Ambulatory Visit (INDEPENDENT_AMBULATORY_CARE_PROVIDER_SITE_OTHER): Payer: Managed Care, Other (non HMO) | Admitting: Obstetrics

## 2016-07-14 ENCOUNTER — Encounter: Payer: Self-pay | Admitting: Obstetrics

## 2016-07-14 VITALS — BP 120/84 | HR 88 | Wt 162.2 lb

## 2016-07-14 DIAGNOSIS — Z3490 Encounter for supervision of normal pregnancy, unspecified, unspecified trimester: Secondary | ICD-10-CM

## 2016-07-14 DIAGNOSIS — Z3480 Encounter for supervision of other normal pregnancy, unspecified trimester: Secondary | ICD-10-CM

## 2016-07-14 NOTE — Progress Notes (Signed)
Subjective:    Brandi Henson is a 20 y.o. female being seen today for her obstetrical visit. She is at 4022w6d gestation. Patient reports no complaints. Fetal movement: normal.  Problem List Items Addressed This Visit    None     Patient Active Problem List   Diagnosis Date Noted  . Supervision of low-risk pregnancy 05/26/2016   Objective:    BP 120/84   Pulse 88   Wt 162 lb 3.2 oz (73.6 kg)   LMP 10/26/2015 (Exact Date)   BMI 22.62 kg/m  FHT:  150 BPM  Uterine Size: size equals dates  Presentation: unsure     Assessment:    Pregnancy @ 7922w6d weeks   Plan:     labs reviewed, problem list updated Consent signed. GBS sent TDAP offered  Rhogam given for RH negative Pediatrician: discussed. Infant feeding: plans to breastfeed. Maternity leave: discussed. Cigarette smoking: never smoked. No orders of the defined types were placed in this encounter.  No orders of the defined types were placed in this encounter.  Follow up in 2 Weeks.

## 2016-07-23 ENCOUNTER — Inpatient Hospital Stay (HOSPITAL_COMMUNITY)
Admission: EM | Admit: 2016-07-23 | Discharge: 2016-07-24 | Disposition: A | Discharge status: Home / Self Care | Payer: Managed Care, Other (non HMO) | Attending: Obstetrics & Gynecology | Admitting: Obstetrics & Gynecology

## 2016-07-23 ENCOUNTER — Encounter (HOSPITAL_COMMUNITY): Payer: Self-pay | Admitting: *Deleted

## 2016-07-23 DIAGNOSIS — Z3A35 35 weeks gestation of pregnancy: Secondary | ICD-10-CM | POA: Diagnosis not present

## 2016-07-23 DIAGNOSIS — Y92414 Local residential or business street as the place of occurrence of the external cause: Secondary | ICD-10-CM | POA: Insufficient documentation

## 2016-07-23 DIAGNOSIS — M545 Low back pain, unspecified: Secondary | ICD-10-CM

## 2016-07-23 DIAGNOSIS — O26893 Other specified pregnancy related conditions, third trimester: Secondary | ICD-10-CM | POA: Insufficient documentation

## 2016-07-23 LAB — COMPREHENSIVE METABOLIC PANEL
ALK PHOS: 159 U/L — AB (ref 38–126)
ALT: 10 U/L — ABNORMAL LOW (ref 14–54)
ANION GAP: 8 (ref 5–15)
AST: 17 U/L (ref 15–41)
Albumin: 2.5 g/dL — ABNORMAL LOW (ref 3.5–5.0)
BILIRUBIN TOTAL: 0.5 mg/dL (ref 0.3–1.2)
BUN: <5 mg/dL — ABNORMAL LOW (ref 6–20)
CALCIUM: 8.6 mg/dL — AB (ref 8.9–10.3)
CO2: 20 mmol/L — ABNORMAL LOW (ref 22–32)
Chloride: 108 mmol/L (ref 101–111)
Creatinine, Ser: 0.58 mg/dL (ref 0.44–1.00)
GFR calc non Af Amer: >60 mL/min (ref >60)
Glucose, Bld: 79 mg/dL (ref 65–99)
Potassium: 3.3 mmol/L — ABNORMAL LOW (ref 3.5–5.1)
SODIUM: 136 mmol/L (ref 135–145)
TOTAL PROTEIN: 6.2 g/dL — AB (ref 6.5–8.1)

## 2016-07-23 LAB — CBC WITH DIFFERENTIAL/PLATELET
Basophils Absolute: 0 10*3/uL (ref 0.0–0.1)
Basophils Relative: 0 %
EOS ABS: 0.1 10*3/uL (ref 0.0–0.7)
Eosinophils Relative: 1 %
HEMATOCRIT: 30 % — AB (ref 36.0–46.0)
HEMOGLOBIN: 10 g/dL — AB (ref 12.0–15.0)
LYMPHS ABS: 1.7 10*3/uL (ref 0.7–4.0)
Lymphocytes Relative: 18 %
MCH: 29.5 pg (ref 26.0–34.0)
MCHC: 33.3 g/dL (ref 30.0–36.0)
MCV: 88.5 fL (ref 78.0–100.0)
MONO ABS: 0.8 10*3/uL (ref 0.1–1.0)
MONOS PCT: 9 %
Neutro Abs: 6.5 10*3/uL (ref 1.7–7.7)
Neutrophils Relative %: 72 %
Platelets: 215 10*3/uL (ref 150–400)
RBC: 3.39 MIL/uL — ABNORMAL LOW (ref 3.87–5.11)
RDW: 13.2 % (ref 11.5–15.5)
WBC: 9.1 10*3/uL (ref 4.0–10.5)

## 2016-07-23 LAB — AMYLASE: AMYLASE: 137 U/L — AB (ref 28–100)

## 2016-07-23 LAB — LIPASE, BLOOD: Lipase: 20 U/L (ref 11–51)

## 2016-07-23 MED ORDER — LACTATED RINGERS IV BOLUS (SEPSIS)
1000.0000 mL | Freq: Once | INTRAVENOUS | Status: AC
  Administered 2016-07-23: 1000 mL via INTRAVENOUS

## 2016-07-23 MED ORDER — ACETAMINOPHEN 325 MG PO TABS
650.0000 mg | ORAL_TABLET | Freq: Once | ORAL | Status: AC
  Administered 2016-07-24: 650 mg via ORAL
  Filled 2016-07-23: qty 2
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
## 2016-07-23 NOTE — Progress Notes (Signed)
Dr Erin FullingHarraway-Smith, [facility] made aware of patient's arrival and complaints; orders given for continued monitoring x4 and [facility] to [facility] at this time

## 2016-07-23 NOTE — ED Notes (Signed)
[facility] at bedside 
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
## 2016-07-23 NOTE — MAU Note (Signed)
[facility].  Pt told RN that she is not having pain and that baby is moving. Pt currently on phone during assessment.
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
## 2016-07-23 NOTE — ED Notes (Signed)
[facility] contacted for transport  

## 2016-07-24 ENCOUNTER — Encounter (HOSPITAL_COMMUNITY): Payer: Self-pay

## 2016-07-24 DIAGNOSIS — Z3A35 35 weeks gestation of pregnancy: Secondary | ICD-10-CM | POA: Diagnosis not present

## 2016-07-24 DIAGNOSIS — O26893 Other specified pregnancy related conditions, third trimester: Secondary | ICD-10-CM | POA: Diagnosis not present

## 2016-07-24 LAB — URINALYSIS, ROUTINE W REFLEX MICROSCOPIC
Bilirubin Urine: NEGATIVE
GLUCOSE, UA: NEGATIVE mg/dL
Hgb urine dipstick: NEGATIVE
Ketones, ur: NEGATIVE mg/dL
NITRITE: NEGATIVE
PH: 6 (ref 5.0–8.0)
Protein, ur: NEGATIVE mg/dL
SPECIFIC GRAVITY, URINE: 1.012 (ref 1.005–1.030)

## 2016-07-24 LAB — TYPE AND SCREEN
ABO/RH(D): O POS
Antibody Screen: NEGATIVE

## 2016-07-24 LAB — ABO/RH: ABO/RH(D): O POS

## 2016-07-24 MED ORDER — CALCIUM CARBONATE ANTACID 500 MG PO CHEW: 2.0000 | CHEWABLE_TABLET | ORAL | Status: DC | PRN

## 2016-07-24 MED ORDER — DOCUSATE SODIUM 100 MG PO CAPS
100.0000 mg | ORAL_CAPSULE | Freq: Every day | ORAL | Status: DC
  Administered 2016-07-24: 100 mg via ORAL
  Filled 2016-07-24 (×2): qty 1

## 2016-07-24 MED ORDER — ACETAMINOPHEN 325 MG PO TABS
650.0000 mg | ORAL_TABLET | ORAL | Status: DC | PRN
  Administered 2016-07-24 (×2): 650 mg via ORAL
  Filled 2016-07-24 (×2): qty 2

## 2016-07-24 MED ORDER — ZOLPIDEM TARTRATE 5 MG PO TABS: 5.0000 mg | ORAL_TABLET | Freq: Every evening | ORAL | Status: DC | PRN

## 2016-07-24 MED ORDER — PRENATAL MULTIVITAMIN CH
1.0000 | ORAL_TABLET | Freq: Every day | ORAL | Status: DC
  Filled 2016-07-24: qty 1

## 2016-07-24 NOTE — Progress Notes (Signed)
Patient needs urine sample collected, but does not need I&Os per Cathie BeamsFran Cresenzo-Dishmon, CNM.
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
## 2016-07-24 NOTE — Procedures (Signed)
Pt ambulated out escorted by RN  . Dr. Note and d/c instructions given to pt verbalized understanding.  Pt in stable condition. No questions or complaints

## 2016-07-24 NOTE — Discharge Instructions (Signed)
What Do I Need to Know About Injuries During Pregnancy? °Injuries can happen during pregnancy. Minor falls and accidents usually do not harm you or your baby. However, any injury should be reported to your doctor. °What can I do to protect myself from injuries? °· Remove rugs and loose objects on the floor. °· Wear comfortable shoes that have a good grip. Do not wear high-heeled shoes. °· Always wear your seat belt. The lap belt should be below your belly. Always practice safe driving. °· Do not ride on a motorcycle. °· Do not participate in high-impact activities or sports. °· Avoid: °¨ Walking on wet or slippery floors. °¨ Fires. °¨ Starting fires. °¨ Lifting heavy pots of boiling or hot liquids. °¨ Fixing electrical problems. °· Only take medicine as told by your doctor. °· Know your blood type and the blood type of the baby's father. °· Call your local emergency services (911 in the [place] if you are a victim of domestic violence or assault. For help and support, contact the National Domestic Violence Hotline. °Get help right away if: °· You fall on your belly or have any high-impact accident or injury. °· You have been a victim of domestic violence or any kind of violence. °· You have been in a car accident. °· You have bleeding from your vagina. °· Fluid is leaking from your vagina. °· You start to have belly cramping (contractions) or pain. °· You feel weak or pass out (faint). °· You start to throw up (vomit) after an injury. °· You have been burned. °· You have a stiff neck or neck pain. °· You get a headache or have vision problems after an injury. °· You do not feel the baby move or the baby is not moving as much as normal. °This information is not intended to replace advice given to you by your health care provider. Make sure you discuss any questions you have with your health care provider. °Document Released: 08/15/2010 Document Revised: 12/19/2015 Document Reviewed: 04/19/2013 °Elsevier Interactive  Patient Education © 2017 Elsevier Inc. ° °
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
## 2016-08-03 ENCOUNTER — Ambulatory Visit (INDEPENDENT_AMBULATORY_CARE_PROVIDER_SITE_OTHER): Payer: Medicaid Other | Admitting: Obstetrics & Gynecology

## 2016-08-03 ENCOUNTER — Encounter: Payer: Self-pay | Admitting: *Deleted

## 2016-08-03 ENCOUNTER — Other Ambulatory Visit (HOSPITAL_COMMUNITY)
Admission: RE | Admit: 2016-08-03 | Discharge: 2016-08-03 | Disposition: A | Discharge status: Home / Self Care | Payer: Medicaid Other | Source: Ambulatory Visit | Attending: Obstetrics & Gynecology | Admitting: Obstetrics & Gynecology

## 2016-08-03 VITALS — BP 124/78 | HR 95 | Temp 97.0°F | Wt 166.8 lb

## 2016-08-03 DIAGNOSIS — Z3493 Encounter for supervision of normal pregnancy, unspecified, third trimester: Secondary | ICD-10-CM

## 2016-08-03 DIAGNOSIS — Z113 Encounter for screening for infections with a predominantly sexual mode of transmission: Secondary | ICD-10-CM | POA: Insufficient documentation
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
## 2016-08-03 NOTE — Progress Notes (Signed)
Patient c/o blurry vision

## 2016-08-04 LAB — OB RESULTS CONSOLE GBS: GBS: NEGATIVE

## 2016-08-05 LAB — GC/CHLAMYDIA PROBE AMP (~~LOC~~) NOT AT ARMC
Chlamydia: NEGATIVE
Neisseria Gonorrhea: NEGATIVE

## 2016-08-05 LAB — STREP GP B NAA: STREP GROUP B AG: NEGATIVE

## 2016-08-13 ENCOUNTER — Encounter: Payer: Medicaid Other | Admitting: Certified Nurse Midwife

## 2016-08-17 ENCOUNTER — Inpatient Hospital Stay (HOSPITAL_COMMUNITY)
Admission: AD | Admit: 2016-08-17 | Discharge: 2016-08-17 | Disposition: A | Discharge status: Home / Self Care | Payer: Medicaid Other | Source: Ambulatory Visit | Attending: Obstetrics & Gynecology | Admitting: Obstetrics & Gynecology

## 2016-08-17 ENCOUNTER — Encounter (HOSPITAL_COMMUNITY): Payer: Self-pay | Admitting: *Deleted

## 2016-08-17 NOTE — MAU Note (Signed)
Pt C/O lower abd cramping for the past week, started having brown discharge last night.  Denies bright red bleeding or LOF.

## 2016-08-17 NOTE — Discharge Instructions (Signed)
Introduction Patient Name: ________________________________________________ Patient Due Date: ____________________ What is a fetal movement count? A fetal movement count is the number of times that you feel your baby move during a certain amount of time. This may also be called a fetal kick count. A fetal movement count is recommended for every pregnant woman. You may be asked to start counting fetal movements as early as week 28 of your pregnancy. Pay attention to when your baby is most active. You may notice your baby's sleep and wake cycles. You may also notice things that make your baby move more. You should do a fetal movement count:  When your baby is normally most active.  At the same time each day. A good time to count movements is while you are resting, after having something to eat and drink. How do I count fetal movements? 1. Find a quiet, comfortable area. Sit, or lie down on your side. 2. Write down the date, the start time and stop time, and the number of movements that you felt between those two times. Take this information with you to your health care visits. 3. For 2 hours, count kicks, flutters, swishes, rolls, and jabs. You should feel at least 10 movements during 2 hours. 4. You may stop counting after you have felt 10 movements. 5. If you do not feel 10 movements in 2 hours, have something to eat and drink. Then, keep resting and counting for 1 hour. If you feel at least 4 movements during that hour, you may stop counting. Contact a health care provider if:  You feel fewer than 4 movements in 2 hours.  Your baby is not moving like he or she usually does. Date: ____________ Start time: ____________ Stop time: ____________ Movements: ____________ Date: ____________ Start time: ____________ Stop time: ____________ Movements: ____________ Date: ____________ Start time: ____________ Stop time: ____________ Movements: ____________ Date: ____________ Start time: ____________  Stop time: ____________ Movements: ____________ Date: ____________ Start time: ____________ Stop time: ____________ Movements: ____________ Date: ____________ Start time: ____________ Stop time: ____________ Movements: ____________ Date: ____________ Start time: ____________ Stop time: ____________ Movements: ____________ Date: ____________ Start time: ____________ Stop time: ____________ Movements: ____________ Date: ____________ Start time: ____________ Stop time: ____________ Movements: ____________ This information is not intended to replace advice given to you by your health care provider. Make sure you discuss any questions you have with your health care provider. Document Released: 08/12/2006 Document Revised: 03/11/2016 Document Reviewed: 08/22/2015 Elsevier Interactive Patient Education  2017 Elsevier Inc.  

## 2016-08-18 ENCOUNTER — Inpatient Hospital Stay (HOSPITAL_COMMUNITY)
Admission: AD | Admit: 2016-08-18 | Discharge: 2016-08-20 | DRG: 775 | Disposition: A | Discharge status: Home / Self Care | Payer: Medicaid Other | Source: Ambulatory Visit | Attending: Family Medicine | Admitting: Family Medicine

## 2016-08-18 ENCOUNTER — Encounter (HOSPITAL_COMMUNITY): Payer: Self-pay | Admitting: *Deleted

## 2016-08-18 DIAGNOSIS — Z833 Family history of diabetes mellitus: Secondary | ICD-10-CM

## 2016-08-18 DIAGNOSIS — Z3A38 38 weeks gestation of pregnancy: Secondary | ICD-10-CM

## 2016-08-18 DIAGNOSIS — Z3490 Encounter for supervision of normal pregnancy, unspecified, unspecified trimester: Secondary | ICD-10-CM

## 2016-08-18 DIAGNOSIS — Z3493 Encounter for supervision of normal pregnancy, unspecified, third trimester: Secondary | ICD-10-CM | POA: Diagnosis present

## 2016-08-18 LAB — CBC
HEMATOCRIT: 32 % — AB (ref 36.0–46.0)
HEMOGLOBIN: 10.7 g/dL — AB (ref 12.0–15.0)
MCH: 28.9 pg (ref 26.0–34.0)
MCHC: 33.4 g/dL (ref 30.0–36.0)
MCV: 86.5 fL (ref 78.0–100.0)
Platelets: 236 10*3/uL (ref 150–400)
RBC: 3.7 MIL/uL — ABNORMAL LOW (ref 3.87–5.11)
RDW: 13.5 % (ref 11.5–15.5)
WBC: 14.9 10*3/uL — AB (ref 4.0–10.5)

## 2016-08-18 LAB — TYPE AND SCREEN
ABO/RH(D): O POS
ANTIBODY SCREEN: NEGATIVE

## 2016-08-18 MED ORDER — OXYTOCIN BOLUS FROM INFUSION
500.0000 mL | Freq: Once | INTRAVENOUS | Status: AC
  Administered 2016-08-18: 500 mL via INTRAVENOUS

## 2016-08-18 MED ORDER — TETANUS-DIPHTH-ACELL PERTUSSIS 5-2.5-18.5 LF-MCG/0.5 IM SUSP
0.5000 mL | Freq: Once | INTRAMUSCULAR | Status: DC
  Filled 2016-08-18: qty 0.5

## 2016-08-18 MED ORDER — COCONUT OIL OIL
1.0000 "application " | TOPICAL_OIL | Status: DC | PRN
  Filled 2016-08-18: qty 120

## 2016-08-18 MED ORDER — DIPHENHYDRAMINE HCL 25 MG PO CAPS: 25.0000 mg | ORAL_CAPSULE | Freq: Four times a day (QID) | ORAL | Status: DC | PRN

## 2016-08-18 MED ORDER — ONDANSETRON HCL 4 MG/2ML IJ SOLN: 4.0000 mg | INTRAMUSCULAR | Status: DC | PRN

## 2016-08-18 MED ORDER — BENZOCAINE-MENTHOL 20-0.5 % EX AERO
1.0000 "application " | INHALATION_SPRAY | CUTANEOUS | Status: DC | PRN
  Administered 2016-08-18: 1 via TOPICAL
  Filled 2016-08-18: qty 56

## 2016-08-18 MED ORDER — LACTATED RINGERS IV SOLN: 500.0000 mL | INTRAVENOUS | Status: DC | PRN

## 2016-08-18 MED ORDER — OXYCODONE-ACETAMINOPHEN 5-325 MG PO TABS: 2.0000 | ORAL_TABLET | ORAL | Status: DC | PRN

## 2016-08-18 MED ORDER — ACETAMINOPHEN 325 MG PO TABS: 650.0000 mg | ORAL_TABLET | ORAL | Status: DC | PRN

## 2016-08-18 MED ORDER — DIBUCAINE 1 % RE OINT: 1.0000 "application " | TOPICAL_OINTMENT | RECTAL | Status: DC | PRN

## 2016-08-18 MED ORDER — SOD CITRATE-CITRIC ACID 500-334 MG/5ML PO SOLN: 30.0000 mL | ORAL | Status: DC | PRN

## 2016-08-18 MED ORDER — LACTATED RINGERS IV SOLN
INTRAVENOUS | Status: DC
  Administered 2016-08-18: 11:00:00 via INTRAVENOUS

## 2016-08-18 MED ORDER — SENNOSIDES-DOCUSATE SODIUM 8.6-50 MG PO TABS
2.0000 | ORAL_TABLET | ORAL | Status: DC
  Administered 2016-08-19 (×2): 2 via ORAL
  Filled 2016-08-18 (×2): qty 2

## 2016-08-18 MED ORDER — OXYTOCIN 40 UNITS IN LACTATED RINGERS INFUSION - SIMPLE MED
2.5000 [IU]/h | INTRAVENOUS | Status: DC
  Filled 2016-08-18: qty 1000

## 2016-08-18 MED ORDER — ONDANSETRON HCL 4 MG PO TABS: 4.0000 mg | ORAL_TABLET | ORAL | Status: DC | PRN

## 2016-08-18 MED ORDER — FLEET ENEMA 7-19 GM/118ML RE ENEM: 1.0000 | ENEMA | RECTAL | Status: DC | PRN

## 2016-08-18 MED ORDER — SIMETHICONE 80 MG PO CHEW: 80.0000 mg | CHEWABLE_TABLET | ORAL | Status: DC | PRN

## 2016-08-18 MED ORDER — WITCH HAZEL-GLYCERIN EX PADS: 1.0000 "application " | MEDICATED_PAD | CUTANEOUS | Status: DC | PRN

## 2016-08-18 MED ORDER — LIDOCAINE HCL (PF) 1 % IJ SOLN
30.0000 mL | INTRAMUSCULAR | Status: AC | PRN
  Administered 2016-08-18 (×2): 30 mL via SUBCUTANEOUS
  Filled 2016-08-18 (×2): qty 30

## 2016-08-18 MED ORDER — ZOLPIDEM TARTRATE 5 MG PO TABS: 5.0000 mg | ORAL_TABLET | Freq: Every evening | ORAL | Status: DC | PRN

## 2016-08-18 MED ORDER — FENTANYL CITRATE (PF) 100 MCG/2ML IJ SOLN
100.0000 ug | INTRAMUSCULAR | Status: DC | PRN
  Administered 2016-08-18 (×2): 100 ug via INTRAVENOUS
  Filled 2016-08-18 (×2): qty 2

## 2016-08-18 MED ORDER — PRENATAL MULTIVITAMIN CH
1.0000 | ORAL_TABLET | Freq: Every day | ORAL | Status: DC
  Administered 2016-08-19: 1 via ORAL
  Filled 2016-08-18: qty 1

## 2016-08-18 MED ORDER — OXYCODONE-ACETAMINOPHEN 5-325 MG PO TABS: 1.0000 | ORAL_TABLET | ORAL | Status: DC | PRN

## 2016-08-18 MED ORDER — ONDANSETRON HCL 4 MG/2ML IJ SOLN: 4.0000 mg | Freq: Four times a day (QID) | INTRAMUSCULAR | Status: DC | PRN

## 2016-08-18 MED ORDER — IBUPROFEN 600 MG PO TABS
600.0000 mg | ORAL_TABLET | Freq: Four times a day (QID) | ORAL | Status: DC
  Administered 2016-08-18 – 2016-08-19 (×3): 600 mg via ORAL
  Filled 2016-08-18 (×4): qty 1
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
## 2016-08-18 NOTE — Lactation Note (Signed)
This note was copied from a baby's chart. Lactation Consultation Note  Patient Name: Boy Rudy Jewlexis Labra MWUXL'KToday's Date: 08/18/2016 Reason for consult: Initial assessment Breastfeeding consultation services and support information given and reviewed.  This is mom's first baby and newborn is 2 hours old.  Mom states baby latched soon after birth without difficulty.  Reviewed waking techniques and instructed to feed with any cue.  Encouraged to call with concerns/assist.  Maternal Data Does the patient have breastfeeding experience prior to this delivery?: No  Feeding Feeding Type: Breast Fed Length of feed: 30 min  LATCH Score/Interventions Latch: Grasps breast easily, tongue down, lips flanged, rhythmical sucking.  Audible Swallowing: None Intervention(s): Skin to skin  Type of Nipple: Everted at rest and after stimulation  Comfort (Breast/Nipple): Soft / non-tender     Hold (Positioning): Assistance needed to correctly position infant at breast and maintain latch.  LATCH Score: 7  Lactation Tools Discussed/Used     Consult Status Consult Status: Follow-up Date: 08/19/16 Follow-up type: In-patient    Huston FoleyMOULDEN, Eunice Oldaker S 08/18/2016, 6:15 PM

## 2016-08-18 NOTE — Anesthesia Pain Management Evaluation Note (Signed)
  CRNA Pain Management Visit Note  Patient: Brandi Henson, 21 y.o., female  "Hello I am a member of the anesthesia team at [facility]. We have an anesthesia team available at all times to provide care throughout the hospital, including epidural management and anesthesia for C-section. I don't know your plan for the delivery whether it a natural birth, water birth, IV sedation, nitrous supplementation, doula or epidural, but we want to meet your pain goals."   1.Was your pain managed to your expectations on prior hospitalizations?   Yes   2.What is your expectation for pain management during this hospitalization?     Nitrous Oxide  3.How can we help you reach that goal? Nursing interventions  Record the patient's initial score and the patient's pain goal.   Pain: 10/10  Pain Goal: 4/10 [facility] wants you to be able to say your pain was always managed very well.  Salome ArntSterling, Demarrion Meiklejohn Marie 08/18/2016

## 2016-08-18 NOTE — MAU Note (Signed)
Pt states she was seen yesterday for brown discharge.  Pt states she went home and started cramping real bad and started having mucous discharge when she goes to the bathroom.

## 2016-08-19 ENCOUNTER — Encounter: Payer: Medicaid Other | Admitting: Advanced Practice Midwife

## 2016-08-19 LAB — RPR: RPR Ser Ql: NONREACTIVE

## 2016-08-19 MED ORDER — IBUPROFEN 100 MG/5ML PO SUSP
600.0000 mg | Freq: Four times a day (QID) | ORAL | Status: DC
  Administered 2016-08-19 – 2016-08-20 (×3): 600 mg via ORAL
  Filled 2016-08-19 (×4): qty 30

## 2016-08-19 NOTE — Progress Notes (Signed)
UR chart review completed.  

## 2016-08-19 NOTE — Clinical Social Work Maternal (Signed)
  CLINICAL SOCIAL WORK MATERNAL/CHILD NOTE  Patient Details  Name: Brandi Henson MRN: [id number] Date of Birth: 06/18/1996  Date:  08/19/2016  Clinical Social Worker Initiating Note:  Laurey Arrow Date/ Time Initiated:  08/19/16/1051     Child's Name:  Brandi Henson   Legal Guardian:  Mother   Need for Interpreter:  None   Date of Referral:  08/19/16     Reason for Referral:  Current Substance Use/Substance Use During Pregnancy    Referral Source:  [facility]   Address:  [place] [address]  Phone number:  [phone]   Household Members:  Self, Minor Children, Spouse   Natural Supports (not living in the home):  Extended Family, Immediate Family ([name] and family will be supported by [name]'s and [name]'s immediate and extended  family members. 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   Education:  Database administrator Resources:  [employer]   Other Resources:  [employer] (CSW provided [name] with information to apply for [facility].)   Cultural/Religious Considerations Which May Impact Care:  Per [name]'s Face Sheet [name] is Non-Denominational   Strengths:  Ability to meet basic needs    Risk Factors/Current Problems:  Substance Use    Cognitive State:  Alert , Able to Concentrate , Insightful , Goal Oriented    Mood/Affect:  Comfortable , Interested , Relaxed    CSW Assessment: CSW met with [name] to complete an assessment for hx of substance use during pregnancy.  When CSW arrived, [name] was resting in bed, and [name]  Brandi Henson 03/08/1993) was sitting on couch bonding with infant. [name] gave CSW permission to meet with [name] while [name] was present. [name] was polite,inviting, and interested in meeting with CSW.  [name] was appeared concerned as evident by [name]'s facial expression. CSW informed the family of CSW role and reason for meeting with [name].  [name] expressed that [name] was  surprised to be informed that [name] was using substance during pregnancy. [name] acknowledged the the use of marijuana prior to pregnancy confirmation and denied any use after pregnancy was confirmed.  [name] also disclosed that [name] has friends/family that [name] is with daily that engage in recreational use of marijuana. [name] attributed [name]'s positive screen to second hand smoke. CSW offered [name] community resources for SA and [name] declined.  CSW informed [name] of the hospital's drug screen policy, and informed [name] of the 2 screenings for the infant. [name] appeared understanding and communicated she was not concerned about the infant having a positive UDS or CDS. CSW shared with [name] that the infant's UDS was negative and CSW will continue to monitor the infant's CDS. CSW made [name] aware that if the infant's CDS is positive without an explanation, CSW will make a report to [facility] CPS.  [name] did not have any questions regarding the hospital's policy. CSW thanked [name] for meeting with CSW and provided [name] with CSW contact information. CSW Plan/Description:  Information/Referral to [employer] , [employer] , No Further Intervention Required/No Barriers to Discharge (CSW will monitor infant's UDS and CDS and will make a CPS report if warranted. )   Laurey Arrow, MSW, LCSW Clinical Social Work [phone]  Brandi Nanas, LCSW 08/19/2016, 11:07 AM

## 2016-08-19 NOTE — Progress Notes (Signed)
@  2130 pt still due to void but didn't feel the urge and couldn't void, bladder scan showed >73550mL, straight catheter returned 1000mL of clear/yellow urine. Pt stated that she "was able to pee a little bit" at about 0200. When asked what she meant, she stated "i was able to pee, but then it stopped suddenly." Hat placed in toilet to measure next void and explained to patient that I want to measure how much she is voiding to ensure it is adequate and that after the next pee, I also want to scan her bladder again to see if she is retaining urine. Patient verbalizes understanding.

## 2016-08-19 NOTE — Lactation Note (Signed)
This note was copied from a baby's chart. Lactation Consultation Note  Mother will call for to view latch due to soreness. No trauma noted on nipples.  Suggest wearing different bra than sports bra when breastmilk transitions.   Patient Name: [name] [id number]'s Date: 08/19/2016 Reason for consult: Follow-up assessment   Maternal Data    Feeding Feeding Type: Breast Fed  LATCH Score/Interventions                      Lactation Tools Discussed/Used     Consult Status Consult Status: Follow-up Date: 08/19/16 Follow-up type: In-patient    Dahlia ByesBerkelhammer, Lanika [facility] 08/19/2016, 1:35 PM

## 2016-08-19 NOTE — Progress Notes (Signed)
Post Partum Day 1 Subjective: No acute events overnight. Pain and crmaping well controlled. Patient has not been up and moving, encouraged OOB activity today. Lochia appropriate.   Objective: Blood pressure 126/63, pulse 89, temperature 98.2 F (36.8 C), temperature source Oral, resp. rate 18, last menstrual period 10/26/2015, SpO2 100 %, unknown if currently breastfeeding.  Physical Exam:  General: alert, cooperative and no distress Lochia: appropriate Uterine Fundus: firm Incision: none DVT Evaluation: No evidence of DVT seen on physical exam. No cords or calf tenderness. No significant calf/ankle edema.   Recent Labs  08/18/16 1031  HGB 10.7*  HCT 32.0*    Assessment/Plan: Rudy Jewlexis Trimpe is a 21 y.o. female, G1P1001, here for SVD at 1647w6d.  Plan for discharge tomorrow Breastfeeding Contraception with OCPs   LOS: 1 day   Dartha LodgeKelsey N Huzaifa Viney 08/19/2016, 7:28 AM

## 2016-08-19 NOTE — Progress Notes (Signed)
Post Partum Day 1 Subjective: no complaints, up ad lib, voiding and tolerating PO  Objective: Blood pressure 126/63, pulse 89, temperature 98.2 F (36.8 C), temperature source Oral, resp. rate 18, last menstrual period 10/26/2015, SpO2 100 %, unknown if currently breastfeeding.  Physical Exam:  General: alert, cooperative and no distress Lochia: appropriate Uterine Fundus: firm Incision: healing well DVT Evaluation: No evidence of DVT seen on physical exam.   Recent Labs  08/18/16 1031  HGB 10.7*  HCT 32.0*    Assessment/Plan: Plan for discharge tomorrow and Breastfeeding   LOS: 1 day   Wynelle BourgeoisMarie Helmer Dull 08/19/2016, 8:34 AM

## 2016-08-20 MED ORDER — IBUPROFEN 100 MG/5ML PO SUSP: 600.0000 mg | Freq: Four times a day (QID) | ORAL | 2 refills | Status: DC

## 2016-08-20 NOTE — Discharge Summary (Signed)
OB Discharge Summary     Patient Name: Brandi Henson DOB: 05-Feb-1996 MRN: [id number]  Date of admission: 08/18/2016 Delivering MD: Gorden HarmsAMPBELL, MEGAN C   Date of discharge: 08/20/2016  Admitting diagnosis: 38WKS CRAMPS Intrauterine pregnancy: 4461w6d     Secondary diagnosis:  Active Problems:   Pregnant and not yet delivered  Additional problems: none     Discharge diagnosis: Term Pregnancy Delivered                                                                                                Post partum procedures:none  Augmentation: Pitocin  Complications: None  Hospital course:  Onset of Labor With Vaginal Delivery     21 y.o. yo G1P1001 at 1561w6d was admitted in Active Labor on 08/18/2016. Patient had an uncomplicated labor course as follows:  Membrane Rupture Time/Date: 3:00 PM ,08/18/2016   Intrapartum Procedures: Episiotomy: None [1]                                         Lacerations:  2nd degree [3]  Patient had a delivery of a Viable infant. 08/18/2016  Information for the patient's newborn:  Colin BentonCalloway, Boy Genavive [[id number]][[id number]]  Delivery Method: Vaginal, Spontaneous Delivery (Filed from Delivery Summary)    Pateint had an uncomplicated postpartum course.  She is ambulating, tolerating a regular diet, passing flatus, and urinating well. Patient is discharged home in stable condition on 08/20/16.   Physical exam  Vitals:   08/18/16 1905 08/18/16 2315 08/19/16 1725 08/20/16 0540  BP: 119/81 126/63 118/70 112/71  Pulse: 96 89 92 93  Resp: 18 18 18 20   Temp: 98.5 F (36.9 C) 98.2 F (36.8 C) 98 F (36.7 C) 98.5 F (36.9 C)  TempSrc: Oral Oral Oral Oral  SpO2: 100% 100%     General: alert, cooperative and no distress Lochia: appropriate Uterine Fundus: firm Incision: N/A DVT Evaluation: No evidence of DVT seen on physical exam. No cords or calf tenderness. No significant calf/ankle edema. Labs: Lab Results  Component Value Date   WBC 14.9 (H) 08/18/2016   HGB 10.7 (L) 08/18/2016   HCT 32.0 (L) 08/18/2016   MCV 86.5 08/18/2016   PLT 236 08/18/2016   CMP Latest Ref Rng & Units 07/23/2016  Glucose 65 - 99 mg/dL 79  BUN 6 - 20 mg/dL <5(M<5(L)  Creatinine 8.410.44 - 1.00 mg/dL 3.240.58  Sodium 401135 - 027145 mmol/L 136  Potassium 3.5 - 5.1 mmol/L 3.3(L)  Chloride 101 - 111 mmol/L 108  CO2 22 - 32 mmol/L 20(L)  Calcium 8.9 - 10.3 mg/dL 2.5(D8.6(L)  Total Protein 6.5 - 8.1 g/dL 6.2(L)  Total Bilirubin 0.3 - 1.2 mg/dL 0.5  Alkaline Phos 38 - 126 U/L 159(H)  AST 15 - 41 U/L 17  ALT 14 - 54 U/L 10(L)    Discharge instruction: per After Visit Summary and "Baby and Me Booklet".  After visit meds:  Allergies as of 08/20/2016   No Known Allergies     Medication List  TAKE these medications   ibuprofen 100 MG/5ML suspension Commonly known as:  ADVIL,MOTRIN Take 30 mLs (600 mg total) by mouth every 6 (six) hours.   VITAFOL GUMMIES 3.33-0.333-34.8 MG Chew Chew 1 each by mouth 3 (three) times daily.       Diet: routine diet  Activity: Advance as tolerated. Pelvic rest for 6 weeks.   Outpatient follow up: 4 weeks Follow up Appt:No future appointments. Follow up Visit:No Follow-up on file.  Postpartum contraception: Combination OCPs  Newborn Data: Live born female  Birth Weight: 6 lb 5.1 oz (2866 g) APGAR: 8, 9  Baby Feeding: Bottle and Breast Disposition:home with mother   08/20/2016 Roe Coombs, CNM

## 2016-08-20 NOTE — Progress Notes (Signed)
Post Partum Day #2 Subjective: no complaints, up ad lib, voiding and tolerating PO  Objective: Blood pressure 112/71, pulse 93, temperature 98.5 F (36.9 C), temperature source Oral, resp. rate 20, last menstrual period 10/26/2015, SpO2 100 %, unknown if currently breastfeeding.  Physical Exam:  General: alert, cooperative and no distress Lochia: appropriate Uterine Fundus: firm Incision: none DVT Evaluation: No evidence of DVT seen on physical exam. No cords or calf tenderness. No significant calf/ankle edema.   Recent Labs  08/18/16 1031  HGB 10.7*  HCT 32.0*    Assessment/Plan: Discharge home, Breastfeeding and Contraception OCPs   LOS: 2 days   Roe CoombsRachelle A Denney, CNM 08/20/2016, 7:09 AM

## 2016-08-20 NOTE — Lactation Note (Signed)
This note was copied from a baby's chart. Lactation Consultation Note  Patient Name: Brandi Henson ZOXWR'UToday's Date: 08/20/2016 Reason for consult: Follow-up assessment   Mom with baby STS in room.  [name] present and very informed and involved about breastfeeding/ care of baby.  Mom states she gave bottles because of nipple discomfort.  Baby showing cues; mom placed baby in football hold with help of pillow positioning from LC.  Mom hand expressed several drops of colostrum.  Baby latched and immediately had strong rhythmic jaw movements.  Audible swallows heard.  LC encouraged mom to massage breast while baby nursed.  LC reviewed supply and demand and encouraged mom to breastfeed baby prior to giving formula.  Mom states she hadn't breastfed the baby since last night.  LC reviewed engorgement care with family and patient.  Hand pump provided and coconut oil given bc mom expressed friction discomfort when LC demonstrated on mom hand pump usage while baby was on alternate breast.  Baby nursed for 12 minutes then came off breast satisfied and asleep.  LC reviewed BF support group times and phone number to call if she had questions or concerns regarding breastcare or feeding infant.  BM storage reviewed as well as hand pump review.  Mom states she has an appointment with WIC next week.  Mom states she desires to breastfeed mostly and pump her own milk in order to give bottles to baby.  Cluster feeding reviewed and LC reminded mom that baby needs to bf a minimum of 8-12 times a day.         Maternal Data    Feeding Feeding Type: Breast Fed Length of feed: 12 min  LATCH Score/Interventions Latch: Grasps breast easily, tongue down, lips flanged, rhythmical sucking.  Audible Swallowing: Spontaneous and intermittent Intervention(s): Skin to skin;Hand expression (mom demonstrated hand exp. gtts of colostrum noted)  Type of Nipple: Everted at rest and after stimulation  Comfort (Breast/Nipple):  Filling, red/small blisters or bruises, mild/mod discomfort  Problem noted: Mild/Moderate discomfort (mom expresses mild nipple discomfort) Interventions (Mild/moderate discomfort): Hand expression;Hand massage;Pre-pump if needed (LC provided hand pump/ coconut oil for flange lubrication)  Hold (Positioning): Assistance needed to correctly position infant at breast and maintain latch. Intervention(s): Breastfeeding basics reviewed;Support Pillows;Position options;Skin to skin  LATCH Score: 8  Lactation Tools Discussed/Used Tools: Pump Breast pump type: Manual (hand pump for dc/engorgment care reviewed) WIC Program: Yes Pump Review: Milk Storage;Setup, frequency, and cleaning Initiated by:: [name] LC Date initiated:: 08/20/16   Consult Status Consult Status: Complete Date: 08/20/16    [name] 08/20/2016, 9:58 AM

## 2016-08-24 ENCOUNTER — Encounter: Payer: Self-pay | Admitting: *Deleted

## 2016-08-26 ENCOUNTER — Telehealth: Payer: Self-pay | Admitting: *Deleted

## 2016-08-26 NOTE — Progress Notes (Signed)
CSW made a [name] and [facility].   Blaine HamperAngel Boyd-Gilyard, MSW, LCSW Clinical Social Work [phone]

## 2016-08-26 NOTE — Telephone Encounter (Signed)
[facility], Ladonna SnideShonda. She states that pt scored 19 on her PP depression scale.  States that [facility]. Ladonna SnideShonda states that pt feels more that it is due to family situation, not as much related to pregnancy/delivery. Ladonna SnideShonda states that at her visit pt was very interactive with baby and states no thoughts of harm. Ladonna SnideShonda states that pt informed her that pt lives with her parents and they seem to be keeping her from doing things that she would like. Ladonna SnideShonda states that pt and [name] do not currently live together, they are looking for a place.  [name] would like to be involved, he works an off shift so it's hard to see pt and baby.  Pt's parents do not want her to baby out currently to be able to visit/see [name].  Ladonna SnideShonda states that pt parents seem to be in control of situations. Ladonna SnideShonda states that pt got teary eyed when talking with her about situation.    Attempt to contact pt in order to follow up on situation.  Need to offer pt an early PP visit here in our office. LM on VM to call office to follow up.

## 2016-08-28 NOTE — Telephone Encounter (Signed)
TC to pt. Denies feelings of harming self and/or others. Pt aware appts will contact her to schedule an appt ASAP.

## 2016-09-01 ENCOUNTER — Encounter: Payer: Self-pay | Admitting: Obstetrics and Gynecology

## 2016-09-01 ENCOUNTER — Ambulatory Visit (INDEPENDENT_AMBULATORY_CARE_PROVIDER_SITE_OTHER): Payer: Medicaid Other | Admitting: Obstetrics and Gynecology

## 2016-09-01 DIAGNOSIS — R4589 Other symptoms and signs involving emotional state: Secondary | ICD-10-CM | POA: Insufficient documentation

## 2016-09-01 DIAGNOSIS — O9989 Other specified diseases and conditions complicating pregnancy, childbirth and the puerperium: Principal | ICD-10-CM

## 2016-09-01 NOTE — Progress Notes (Signed)
Patient is in the office for follow up after a vaginal delivery on 08-18-16.

## 2016-09-01 NOTE — Addendum Note (Signed)
Addended by: Natale MilchSTALLING, Lyrick Worland D on: 09/01/2016 11:02 AM   Modules accepted: Orders

## 2016-09-01 NOTE — Patient Instructions (Signed)
Postpartum Depression and Baby Blues The postpartum period begins right after the birth of a baby. During this time, there is often a great amount of joy and excitement. It is also a time of many changes in the life of the parents. Regardless of how many times a mother gives birth, each child brings new challenges and dynamics to the family. It is not unusual to have feelings of excitement along with confusing shifts in moods, emotions, and thoughts. All mothers are at risk of developing postpartum depression or the "baby blues." These mood changes can occur right after giving birth, or they may occur many months after giving birth. The baby blues or postpartum depression can be mild or severe. Additionally, postpartum depression can go away rather quickly, or it can be a long-term condition. What are the causes? Raised hormone levels and the rapid drop in those levels are thought to be a main cause of postpartum depression and the baby blues. A number of hormones change during and after pregnancy. Estrogen and progesterone usually decrease right after the delivery of your baby. The levels of thyroid hormone and various cortisol steroids also rapidly drop. Other factors that play a role in these mood changes include major life events and genetics. What increases the risk? If you have any of the following risks for the baby blues or postpartum depression, know what symptoms to watch out for during the postpartum period. Risk factors that may increase the likelihood of getting the baby blues or postpartum depression include:  Having a personal or family history of depression.  Having depression while being pregnant.  Having premenstrual mood issues or mood issues related to oral contraceptives.  Having a lot of life stress.  Having marital conflict.  Lacking a social support network.  Having a baby with special needs.  Having health problems, such as diabetes.  What are the signs or  symptoms? Symptoms of baby blues include:  Brief changes in mood, such as going from extreme happiness to sadness.  Decreased concentration.  Difficulty sleeping.  Crying spells, tearfulness.  Irritability.  Anxiety.  Symptoms of postpartum depression typically begin within the first month after giving birth. These symptoms include:  Difficulty sleeping or excessive sleepiness.  Marked weight loss.  Agitation.  Feelings of worthlessness.  Lack of interest in activity or food.  Postpartum psychosis is a very serious condition and can be dangerous. Fortunately, it is rare. Displaying any of the following symptoms is cause for immediate medical attention. Symptoms of postpartum psychosis include:  Hallucinations and delusions.  Bizarre or disorganized behavior.  Confusion or disorientation.  How is this diagnosed? A diagnosis is made by an evaluation of your symptoms. There are no medical or lab tests that lead to a diagnosis, but there are various questionnaires that a health care provider may use to identify those with the baby blues, postpartum depression, or psychosis. Often, a screening tool called the Edinburgh Postnatal Depression Scale is used to diagnose depression in the postpartum period. How is this treated? The baby blues usually goes away on its own in 1-2 weeks. Social support is often all that is needed. You will be encouraged to get adequate sleep and rest. Occasionally, you may be given medicines to help you sleep. Postpartum depression requires treatment because it can last several months or longer if it is not treated. Treatment may include individual or group therapy, medicine, or both to address any social, physiological, and psychological factors that may play a role in the   depression. Regular exercise, a healthy diet, rest, and social support may also be strongly recommended. Postpartum psychosis is more serious and needs treatment right away.  Hospitalization is often needed. Follow these instructions at home:  Get as much rest as you can. Nap when the baby sleeps.  Exercise regularly. Some women find yoga and walking to be beneficial.  Eat a balanced and nourishing diet.  Do little things that you enjoy. Have a cup of tea, take a bubble bath, read your favorite magazine, or listen to your favorite music.  Avoid alcohol.  Ask for help with household chores, cooking, grocery shopping, or running errands as needed. Do not try to do everything.  Talk to people close to you about how you are feeling. Get support from your partner, family members, friends, or other new moms.  Try to stay positive in how you think. Think about the things you are grateful for.  Do not spend a lot of time alone.  Only take over-the-counter or prescription medicine as directed by your health care provider.  Keep all your postpartum appointments.  Let your health care provider know if you have any concerns. Contact a health care provider if: You are having a reaction to or problems with your medicine. Get help right away if:  You have suicidal feelings.  You think you may harm the baby or someone else. This information is not intended to replace advice given to you by your health care provider. Make sure you discuss any questions you have with your health care provider. Document Released: 04/16/2004 Document Revised: 12/19/2015 Document Reviewed: 04/24/2013 Elsevier Interactive Patient Education  2017 Elsevier Inc.  

## 2016-09-01 NOTE — Progress Notes (Signed)
Pt here for evaluation for PP depression. Pt scored 19 on depression test at home visit. Score today 14. Denies any homo/suicidial thoughts. She does report some crying spells, fatigue and insomnia, Lives at home with parents. [name] involved  PE AF VSS Lungs clear  Heart RRR Abd soft + BS  A/P Postpartum emotional liability Tx options reviewed. She desires counseling. [facility]. Pt instructed to call and or present to ER for homo/suicidal thoughts. F/U o/w with PP visit.

## 2016-09-02 ENCOUNTER — Institutional Professional Consult (permissible substitution): Payer: Medicaid Other

## 2016-09-02 NOTE — BH Specialist Note (Deleted)
Session Start time: ***   End Time: *** Total Time:  *** Type of Service: Behavioral Health - Individual/Family Interpreter: No.   Interpreter Name & Language: n/a # [facility] July 2017-June 2018: 1st  SUBJECTIVE: Brandi Henson is a 21 y.o. female  Pt. was referred by Dr Rande LawmanErwin for:  depression. Pt. reports the following symptoms/concerns: *** Duration of problem:  *** Severity: {DESC;mild/moderate/severe:33302} Previous treatment: ***  OBJECTIVE: Mood: {BHH MOOD:22306} & Affect: {BHH AFFECT:22307} Risk of harm to self or others: No known risk of harm to self or others Assessments administered: PHQ9: ***/ GAD7: ***  LIFE CONTEXT:  Family & Social: ***  School/ Work: ***  Self-Care: ***  Life changes: *** What is important to pt/family (values): ***  GOALS ADDRESSED:  -Reduce symptoms of depression ***  INTERVENTIONS: {CHL AMB BH Type of Intervention:21022753}   ASSESSMENT:  Pt currently experiencing ***.  Pt may benefit from psychoeducation and brief therapeutic interventions regarding coping with symptoms of depression ***.   PLAN: 1. F/U with behavioral health clinician: *** 2. Behavioral Health meds: none 3. Behavioral recommendations:  -*** -*** -Read educational material regarding coping with symptoms of depression *** -*** Mom Talk support [facility]ues[facility] 10am[facility] 4. Referral: {BH Clinician Interventions:[phone]} 5. From scale of 1-10, how likely are you to follow plan: ***  Woc-Behavioral Health Clinician  Behavioral Health Clinician  Warmhandoff: no  ***

## 2016-09-29 ENCOUNTER — Ambulatory Visit (INDEPENDENT_AMBULATORY_CARE_PROVIDER_SITE_OTHER): Payer: Medicaid Other | Admitting: Obstetrics and Gynecology

## 2016-09-29 ENCOUNTER — Encounter: Payer: Self-pay | Admitting: Obstetrics and Gynecology

## 2016-09-29 VITALS — BP 107/71 | HR 102 | Temp 97.8°F | Wt 151.5 lb

## 2016-09-29 DIAGNOSIS — Z30011 Encounter for initial prescription of contraceptive pills: Secondary | ICD-10-CM

## 2016-09-29 DIAGNOSIS — IMO0001 Reserved for inherently not codable concepts without codable children: Secondary | ICD-10-CM | POA: Insufficient documentation

## 2016-09-29 DIAGNOSIS — R4589 Other symptoms and signs involving emotional state: Secondary | ICD-10-CM

## 2016-09-29 DIAGNOSIS — O9989 Other specified diseases and conditions complicating pregnancy, childbirth and the puerperium: Principal | ICD-10-CM

## 2016-09-29 MED ORDER — NORGESTIMATE-ETH ESTRADIOL 0.25-35 MG-MCG PO TABS: 1.0000 | ORAL_TABLET | Freq: Every day | ORAL | 11 refills | Status: DC

## 2016-09-29 NOTE — Progress Notes (Signed)
Subjective:     Brandi Henson is a [age] y.o. female who presents for a postpartum visit. She is 6 weeks postpartum following a spontaneous vaginal delivery. I have fully reviewed the prenatal and intrapartum course. The delivery was at 39 gestational weeks. Outcome: spontaneous vaginal delivery. Anesthesia: none. Postpartum course has been UNREMARKABLE. Baby's course has been UNREMARKABLE. Baby is feeding by bottle - Similac Advance. Bleeding no bleeding. Bowel function is normal. Bladder function is normal. Patient is not sexually active. Contraception method is abstinence. Postpartum depression screening: positive.  The following portions of the patient's history were reviewed and updated as appropriate: allergies, current medications, past family history, past medical history, past social history and past surgical history.  Review of Systems Pertinent items are noted in HPI.   Objective:    BP 107/71   Pulse (!) 102   Temp 97.8 F (36.6 C)   Wt 151 lb 8 oz (68.7 kg)   LMP 09/18/2016   Breastfeeding? No   BMI 22.37 kg/m    Lungs clear Heart RRR Abd soft + BS GU deffered                                        Assessment:    NL postpartum exam.   Contraception  PP depression  Plan:    OCP's for contraception. U/R/B and back up method reviewed Pt did not follow up with Marijean NiemannJa[facility] counselor from last visit. Pt reports doing better. Denies any Homo/Suicidal thoughts She declines counseling or medications presently. Pt encouraged to contact office if she would change her mind.  F/U in 3 months to check OCP's

## 2016-09-29 NOTE — Patient Instructions (Signed)
Oral Contraception Information Oral contraceptive pills (OCPs) are medicines taken to prevent pregnancy. OCPs work by preventing the ovaries from releasing eggs. The hormones in OCPs also cause the cervical mucus to thicken, preventing the sperm from entering the uterus. The hormones also cause the uterine lining to become thin, not allowing a fertilized egg to attach to the inside of the uterus. OCPs are highly effective when taken exactly as prescribed. However, OCPs do not prevent sexually transmitted diseases (STDs). Safe sex practices, such as using condoms along with the pill, can help prevent STDs. Before taking the pill, you may have a physical exam and Pap test. Your health care provider may order blood tests. The health care provider will make sure you are a good candidate for oral contraception. Discuss with your health care provider the possible side effects of the OCP you may be prescribed. When starting an OCP, it can take 2 to 3 months for the body to adjust to the changes in hormone levels in your body. Types of oral contraception  The combination pill-This pill contains estrogen and progestin (synthetic progesterone) hormones. The combination pill comes in 21-day, 28-day, or 91-day packs. Some types of combination pills are meant to be taken continuously (365-day pills). With 21-day packs, you do not take pills for 7 days after the last pill. With 28-day packs, the pill is taken every day. The last 7 pills are without hormones. Certain types of pills have more than 21 hormone-containing pills. With 91-day packs, the first 84 pills contain both hormones, and the last 7 pills contain no hormones or contain estrogen only.  The minipill-This pill contains the progesterone hormone only. The pill is taken every day continuously. It is very important to take the pill at the same time each day. The minipill comes in packs of 28 pills. All 28 pills contain the hormone. Advantages of oral  contraceptive pills  Decreases premenstrual symptoms.  Treats menstrual period cramps.  Regulates the menstrual cycle.  Decreases a heavy menstrual flow.  May treatacne, depending on the type of pill.  Treats abnormal uterine bleeding.  Treats polycystic ovarian syndrome.  Treats endometriosis.  Can be used as emergency contraception. Things that can make oral contraceptive pills less effective OCPs can be less effective if:  You forget to take the pill at the same time every day.  You have a stomach or intestinal disease that lessens the absorption of the pill.  You take OCPs with other medicines that make OCPs less effective, such as antibiotics, certain HIV medicines, and some seizure medicines.  You take expired OCPs.  You forget to restart the pill on day 7, when using the packs of 21 pills. Risks associated with oral contraceptive pills Oral contraceptive pills can sometimes cause side effects, such as:  Headache.  Nausea.  Breast tenderness.  Irregular bleeding or spotting. Combination pills are also associated with a small increased risk of:  Blood clots.  Heart attack.  Stroke. This information is not intended to replace advice given to you by your health care provider. Make sure you discuss any questions you have with your health care provider. Document Released: 10/03/2002 Document Revised: 12/19/2015 Document Reviewed: 01/01/2013 Elsevier Interactive Patient Education  2017 Elsevier Inc.  

## 2016-11-04 ENCOUNTER — Telehealth: Payer: Self-pay | Admitting: *Deleted

## 2016-11-04 NOTE — Telephone Encounter (Signed)
Spoke with pt today regarding PNV Rx.  Pt has questions about BC.  Pt states she has been nauseated since starting on BC. Pt would like to know if there is another pill she could. Pt advised message to be sent to provider for recommendations.   Please advise on BC change.

## 2016-11-09 ENCOUNTER — Emergency Department (HOSPITAL_COMMUNITY)
Admission: EM | Admit: 2016-11-09 | Discharge: 2016-11-09 | Disposition: A | Discharge status: Home / Self Care | Payer: No Typology Code available for payment source | Attending: Emergency Medicine | Admitting: Emergency Medicine

## 2016-11-09 ENCOUNTER — Encounter (HOSPITAL_COMMUNITY): Payer: Self-pay | Admitting: Emergency Medicine

## 2016-11-09 ENCOUNTER — Emergency Department (HOSPITAL_COMMUNITY): Payer: No Typology Code available for payment source

## 2016-11-09 DIAGNOSIS — Y92410 Unspecified street and highway as the place of occurrence of the external cause: Secondary | ICD-10-CM | POA: Diagnosis not present

## 2016-11-09 DIAGNOSIS — Y9389 Activity, other specified: Secondary | ICD-10-CM | POA: Insufficient documentation

## 2016-11-09 DIAGNOSIS — Y999 Unspecified external cause status: Secondary | ICD-10-CM | POA: Diagnosis not present

## 2016-11-09 DIAGNOSIS — M546 Pain in thoracic spine: Secondary | ICD-10-CM

## 2016-11-09 LAB — I-STAT BETA HCG BLOOD, ED (MC, WL, AP ONLY)

## 2016-11-09 MED ORDER — NAPROXEN 500 MG PO TABS: 500.0000 mg | ORAL_TABLET | Freq: Two times a day (BID) | ORAL | 0 refills | Status: DC

## 2016-11-09 NOTE — ED Notes (Signed)
ED Provider at bedside. 
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
## 2016-11-09 NOTE — ED Notes (Signed)
Patient transported to X-ray 

## 2016-11-09 NOTE — ED Provider Notes (Signed)
Patient assigned, but not in room.   Brandi Morn, NP 11/09/16 1949    Brandi Mulders, MD 11/13/16 [phone]
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
## 2016-11-19 NOTE — Telephone Encounter (Signed)
We can switch to Loestrin 1/20 Complete current pack and then switch Also be sure she is taking her OCP's at night Thanks Casimiro Needle

## 2016-12-01 NOTE — Telephone Encounter (Signed)
Call placed to pt. Pt states that she is doing better with current BC now, does not want to change at this time. Pt advised to call if any other problems with BC.

## 2017-03-08 ENCOUNTER — Ambulatory Visit (HOSPITAL_COMMUNITY)
Admission: EM | Admit: 2017-03-08 | Discharge: 2017-03-08 | Disposition: A | Discharge status: Home / Self Care | Payer: Self-pay | Attending: Family Medicine | Admitting: Family Medicine

## 2017-03-08 ENCOUNTER — Encounter (HOSPITAL_COMMUNITY): Payer: Self-pay | Admitting: Emergency Medicine

## 2017-03-08 DIAGNOSIS — R109 Unspecified abdominal pain: Secondary | ICD-10-CM | POA: Insufficient documentation

## 2017-03-08 DIAGNOSIS — N898 Other specified noninflammatory disorders of vagina: Secondary | ICD-10-CM | POA: Insufficient documentation

## 2017-03-08 DIAGNOSIS — R1030 Lower abdominal pain, unspecified: Secondary | ICD-10-CM

## 2017-03-08 DIAGNOSIS — Z3202 Encounter for pregnancy test, result negative: Secondary | ICD-10-CM

## 2017-03-08 DIAGNOSIS — Z79899 Other long term (current) drug therapy: Secondary | ICD-10-CM | POA: Insufficient documentation

## 2017-03-08 LAB — POCT URINALYSIS DIP (DEVICE)
BILIRUBIN URINE: NEGATIVE
Glucose, UA: NEGATIVE mg/dL
Hgb urine dipstick: NEGATIVE
Leukocytes, UA: NEGATIVE
Nitrite: NEGATIVE
PH: 7 (ref 5.0–8.0)
Protein, ur: NEGATIVE mg/dL
SPECIFIC GRAVITY, URINE: 1.02 (ref 1.005–1.030)
Urobilinogen, UA: 1 mg/dL (ref 0.0–1.0)

## 2017-03-08 LAB — POCT PREGNANCY, URINE: PREG TEST UR: NEGATIVE

## 2017-03-08 MED ORDER — POLYETHYLENE GLYCOL 3350 17 G PO PACK: 17.0000 g | PACK | Freq: Every day | ORAL | 0 refills | Status: DC

## 2017-03-08 MED ORDER — FLUCONAZOLE 150 MG PO TABS: 150.0000 mg | ORAL_TABLET | Freq: Every day | ORAL | 0 refills | Status: DC

## 2017-03-08 MED ORDER — DOCUSATE SODIUM 50 MG PO CAPS: 50.0000 mg | ORAL_CAPSULE | Freq: Two times a day (BID) | ORAL | 0 refills | Status: DC

## 2017-03-08 NOTE — ED Provider Notes (Signed)
[facility]    CSN: [id number] Arrival date & time: 03/08/17  1001     History   Chief Complaint Chief Complaint  Patient presents with  . Abdominal Cramping    HPI Brandi Henson is a 21 y.o. female.   21 year old female comes in for 1 day history of abdominal cramping. She states she noticed it this morning, cramping has since improved. States cramping is lower abdominal, is more noticeable with movement. She also notices some vaginal discharge that she describes as cottage cheeselike. She denies any odor to the discharge. She recently switched body wash, and states she has had some vaginal itching ever since. LMP 02/12/2017. She is sexually active, without condom use, not worried about STDs. She states she had a normal bowel movement this morning, which consists of small round hard stools. Her normal bowel movement is once every 2 weeks. Denies fever, chills, night sweats. Denies nausea, vomiting, diarrhea. Denies urinary symptoms such as frequency, dysuria, hematuria. Denies vaginal pain, spotting. No recent antibiotic use.       Past Medical History:  Diagnosis Date  . Preterm labor     Patient Active Problem List   Diagnosis Date Noted  . Contraception 09/29/2016  . Postpartum emotional lability 09/01/2016    Past Surgical History:  Procedure Laterality Date  . NO PAST SURGERIES      OB History    Gravida Para Term Preterm AB Living   1 1 1     1    SAB TAB Ectopic Multiple Live Births         0 1       Home Medications    Prior to Admission medications   Medication Sig Start Date End Date Taking? Authorizing Provider  norgestimate-ethinyl estradiol (ORTHO-CYCLEN,SPRINTEC,PREVIFEM) 0.25-35 MG-MCG tablet Take 1 tablet by mouth daily. 09/29/16  Yes Hermina Staggers, MD  docusate sodium (COLACE) 50 MG capsule Take 1 capsule (50 mg total) by mouth 2 (two) times daily. 03/08/17   Cathie Hoops, Antonya Leeder V, PA-C  fluconazole (DIFLUCAN) 150 MG tablet Take 1 tablet (150  mg total) by mouth daily. Take second dose 72 hours later if symptoms still persists. 03/08/17   Cathie Hoops, Kinslea Frances V, PA-C  polyethylene glycol (MIRALAX) packet Take 17 g by mouth daily. 03/08/17   Belinda Fisher, PA-C    Family History Family History  Problem Relation Age of Onset  . Diabetes Maternal Grandfather     Social History Social History  Substance Use Topics  . Smoking status: Never Smoker  . Smokeless tobacco: Never Used  . Alcohol use No     Allergies   Patient has no known allergies.   Review of Systems Review of Systems  Reason unable to perform ROS: See HPI as above.     Physical Exam Triage Vital Signs ED Triage Vitals  Enc Vitals Group     BP 03/08/17 1014 102/71     Pulse Rate 03/08/17 1014 88     Resp 03/08/17 1014 16     Temp 03/08/17 1014 98.5 F (36.9 C)     Temp Source 03/08/17 1014 Oral     SpO2 03/08/17 1014 99 %     Weight --      Height --      Head Circumference --      Peak Flow --      Pain Score 03/08/17 1018 2     Pain Loc --      Pain Edu? --  Excl. in GC? --    No data found.   Updated Vital Signs BP 102/71 (BP Location: Left Arm)   Pulse 88   Temp 98.5 F (36.9 C) (Oral)   Resp 16   LMP 02/12/2017   SpO2 99%   Breastfeeding? No   Visual Acuity Right Eye Distance:   Left Eye Distance:   Bilateral Distance:    Right Eye Near:   Left Eye Near:    Bilateral Near:     Physical Exam  Constitutional: She is oriented to person, place, and time. She appears well-developed and well-nourished. No distress.  HENT:  Head: Normocephalic and atraumatic.  Eyes: Pupils are equal, round, and reactive to light. Conjunctivae are normal.  Cardiovascular: Normal rate, regular rhythm and normal heart sounds.  Exam reveals no gallop and no friction rub.   No murmur heard. Pulmonary/Chest: Effort normal and breath sounds normal. She has no wheezes. She has no rales.  Abdominal: Soft. Bowel sounds are normal. She exhibits mass (LLQ). There  is tenderness (RLQ and LLQ). There is no rebound and no guarding.  Genitourinary:  Genitourinary Comments: Deferred per patient's preference.  Neurological: She is alert and oriented to person, place, and time.  Skin: Skin is warm and dry.  Psychiatric: She has a normal mood and affect. Her behavior is normal. Judgment normal.     UC Treatments / Results  Labs (all labs ordered are listed, but only abnormal results are displayed) Labs Reviewed  POCT URINALYSIS DIP (DEVICE) - Abnormal; Notable for the following:       Result Value   Ketones, ur TRACE (*)    All other components within normal limits  URINE CULTURE  POCT PREGNANCY, URINE    EKG  EKG Interpretation None       Radiology No results found.  Procedures Procedures (including critical care time)  Medications Ordered in UC Medications - No data to display   Initial Impression / Assessment and Plan / UC Course  I have reviewed the triage vital signs and the nursing notes.  Pertinent labs & imaging results that were available during my care of the patient were reviewed by me and considered in my medical decision making (see chart for details).     Discussed lab results with patient, urine negative for pregnancy and infection. Urine culture sent, patient will be contacted with any positive results that required treatment. Given description of vaginal discharge, which treat empirically for yeast infection. Patient declined cytology and pelvic exam. Abdominal exam with masses felt on the left lower quadrant, that is consistent with stool retention, given patient's bowel movement, will treat for constipation. No alarming signs today. Discussed other possible causes of lower abdominal cramping, including menstrual cycle, GYN causes, kidney causes. As urine without blood, patient in no acute distress, low suspicion for kidney stones. Patient to follow up with GYN for further testing and evaluation needed if symptoms do not  resolve. Return precautions given.    Final Clinical Impressions(s) / UC Diagnoses   Final diagnoses:  Vaginal discharge  Abdominal cramping    New Prescriptions New Prescriptions   DOCUSATE SODIUM (COLACE) 50 MG CAPSULE    Take 1 capsule (50 mg total) by mouth 2 (two) times daily.   FLUCONAZOLE (DIFLUCAN) 150 MG TABLET    Take 1 tablet (150 mg total) by mouth daily. Take second dose 72 hours later if symptoms still persists.   POLYETHYLENE GLYCOL (MIRALAX) PACKET    Take 17 g by mouth  daily.      Belinda Fisher, PA-C 03/08/17 1050

## 2017-03-08 NOTE — Discharge Instructions (Signed)
Your urine was negative for pregnancy and infection. Given description of your symptoms, treating you empirically for yeast infection. Per your preference, we are not testing you for STDs or other causes of vaginal discharge. Please follow up here or with GYN if discharge does not resolve. I am also treating you for constipation, take colace and/or miralax as directed. Keep hydrated, your urine should be clear to pale yellow in color. Monitor for worsening of symptoms, nausea, vomiting, increased abdominal pain, blood in stool/urine/vomit, to go to the ED for further evaluation.

## 2017-03-08 NOTE — ED Triage Notes (Signed)
[facility] a complaint of lower abdominal cramping that started today.

## 2017-03-09 LAB — URINE CULTURE: CULTURE: NO GROWTH

## 2017-03-22 ENCOUNTER — Encounter: Payer: Self-pay | Admitting: Obstetrics and Gynecology

## 2017-03-22 ENCOUNTER — Ambulatory Visit (INDEPENDENT_AMBULATORY_CARE_PROVIDER_SITE_OTHER): Payer: Medicaid Other | Admitting: Obstetrics and Gynecology

## 2017-03-22 DIAGNOSIS — Z308 Encounter for other contraceptive management: Secondary | ICD-10-CM | POA: Diagnosis not present

## 2017-03-22 DIAGNOSIS — Z3042 Encounter for surveillance of injectable contraceptive: Secondary | ICD-10-CM | POA: Insufficient documentation

## 2017-03-22 MED ORDER — MEDROXYPROGESTERONE ACETATE 150 MG/ML IM SUSP
150.0000 mg | Freq: Once | INTRAMUSCULAR | Status: AC
  Administered 2017-03-22: 150 mg via INTRAMUSCULAR

## 2017-03-22 MED ORDER — MEDROXYPROGESTERONE ACETATE 150 MG/ML IM SUSP: 150.0000 mg | INTRAMUSCULAR | 3 refills | Status: DC

## 2017-03-22 NOTE — Patient Instructions (Signed)

## 2017-03-22 NOTE — Progress Notes (Signed)
Pt here for depo injection. Pt given injection in right upper outer quad. Pt tolerated well. Due for next depo 11/12 through 11/26.

## 2017-03-22 NOTE — Progress Notes (Signed)
Pt c/o consistently forgetting her BCP. She requests to change from BCP to Depo.

## 2017-03-22 NOTE — Progress Notes (Signed)
Patient ID: Brandi Henson, female   DOB: 1996-01-23, 21 y.o.   MRN: [id number] Brandi Henson presents with the desire to switch from OCP's to Depo Provera.  She can not remember to take the pill. Has used Depo Provera in the past.  PE AF VSS Lungs clear Heart RR Abd soft + BS  A/P Depo Provera Contraception  Depo Provera reviewed. Rx sent to pharmacy. Will return after filing RX for first injection

## 2017-03-22 NOTE — Addendum Note (Signed)
Addended by: Hamilton Capri on: 03/22/2017 01:17 PM   Modules accepted: Orders

## 2017-04-07 ENCOUNTER — Ambulatory Visit: Payer: Medicaid Other | Admitting: Certified Nurse Midwife

## 2017-06-15 ENCOUNTER — Ambulatory Visit (INDEPENDENT_AMBULATORY_CARE_PROVIDER_SITE_OTHER): Payer: Medicaid Other

## 2017-06-15 DIAGNOSIS — Z3042 Encounter for surveillance of injectable contraceptive: Secondary | ICD-10-CM

## 2017-06-15 DIAGNOSIS — Z309 Encounter for contraceptive management, unspecified: Secondary | ICD-10-CM | POA: Diagnosis not present

## 2017-06-15 MED ORDER — MEDROXYPROGESTERONE ACETATE 150 MG/ML IM SUSP
150.0000 mg | Freq: Once | INTRAMUSCULAR | Status: AC
  Administered 2017-06-15: 150 mg via INTRAMUSCULAR

## 2017-06-15 NOTE — Progress Notes (Signed)
Nurse visit for pt supplied Depo given R upper outer quad w/o difficulty. Next Depo due Feb 5-Feb 19 pt agrees.

## 2017-09-07 ENCOUNTER — Ambulatory Visit: Payer: Medicaid Other

## 2017-09-14 ENCOUNTER — Ambulatory Visit (INDEPENDENT_AMBULATORY_CARE_PROVIDER_SITE_OTHER): Payer: Medicaid Other | Admitting: Obstetrics & Gynecology

## 2017-09-14 ENCOUNTER — Encounter: Payer: Self-pay | Admitting: Obstetrics & Gynecology

## 2017-09-14 VITALS — BP 114/73 | HR 103 | Ht 69.0 in | Wt 154.5 lb

## 2017-09-14 DIAGNOSIS — Z309 Encounter for contraceptive management, unspecified: Secondary | ICD-10-CM | POA: Diagnosis not present

## 2017-09-14 DIAGNOSIS — Z3042 Encounter for surveillance of injectable contraceptive: Secondary | ICD-10-CM

## 2017-09-14 MED ORDER — MEDROXYPROGESTERONE ACETATE 150 MG/ML IM SUSP
150.0000 mg | Freq: Once | INTRAMUSCULAR | Status: AC
  Administered 2017-09-14: 150 mg via INTRAMUSCULAR

## 2017-09-14 MED ORDER — MEDROXYPROGESTERONE ACETATE 150 MG/ML IM SUSP: 150.0000 mg | INTRAMUSCULAR | 3 refills | Status: DC

## 2017-09-14 NOTE — Patient Instructions (Signed)

## 2017-09-14 NOTE — Progress Notes (Signed)
Patient ID: Brandi Henson, female   DOB: 19-May-1996, 22 y.o.   MRN: [id number]  Chief Complaint  Patient presents with  . Gynecologic Exam  . AUB    since Jan 2019, Depo due toda  . Injections    Last inj 06/15/2017.    HPI Brandi Henson is a 22 y.o. female.  G1P1001 Patient's last menstrual period was 08/23/2017 (exact date). She wishes to continue DMPA injections for contraception. She has occasional BTB on depo and has no complaints. Not breastfeeding HPI  Past Medical History:  Diagnosis Date  . Preterm labor     Past Surgical History:  Procedure Laterality Date  . NO PAST SURGERIES      Family History  Problem Relation Age of Onset  . Diabetes Maternal Grandfather     Social History Social History   Tobacco Use  . Smoking status: Never Smoker  . Smokeless tobacco: Never Used  Substance Use Topics  . Alcohol use: No  . Drug use: Yes    Frequency: 3.0 times per week    Types: Marijuana    No Known Allergies  Current Outpatient Medications  Medication Sig Dispense Refill  . medroxyPROGESTERone (DEPO-PROVERA) 150 MG/ML injection Inject 1 mL (150 mg total) into the muscle every 3 (three) months. 1 mL 3   No current facility-administered medications for this visit.     Review of Systems Review of Systems  Constitutional: Negative.   HENT: Negative.   Respiratory: Negative.   Gastrointestinal: Negative.   Genitourinary: Negative.     Blood pressure 114/73, pulse (!) 103, height 5\' 9"  (1.753 m), weight 154 lb 8 oz (70.1 kg), last menstrual period 08/23/2017, not currently breastfeeding.  Physical Exam Physical Exam  Constitutional: She appears well-developed. No distress.  Pulmonary/Chest: Effort normal. No respiratory distress.  Abdominal: Soft. She exhibits no distension. There is no tenderness.    Data Reviewed Pap 2017 normal, repeat in 2020  Assessment    Well woman on DMPA for contraception    Plan    RTC 3 months for IM DMPA        Scheryl DarterJames Midori Dado 09/14/2017, 11:03 AM

## 2017-09-14 NOTE — Progress Notes (Signed)
Last Depo Provera inj 06/15/2017

## 2017-12-07 ENCOUNTER — Ambulatory Visit (INDEPENDENT_AMBULATORY_CARE_PROVIDER_SITE_OTHER): Payer: Medicaid Other

## 2017-12-07 DIAGNOSIS — Z3042 Encounter for surveillance of injectable contraceptive: Secondary | ICD-10-CM

## 2017-12-07 MED ORDER — MEDROXYPROGESTERONE ACETATE 150 MG/ML IM SUSP
150.0000 mg | Freq: Once | INTRAMUSCULAR | Status: AC
  Administered 2017-12-07: 150 mg via INTRAMUSCULAR

## 2017-12-07 NOTE — Progress Notes (Signed)
Nurse visit for pt supplied Depo given L upper outer quad w/o difficulty. Next Depo due July 30 - Aug 13.

## 2017-12-14 ENCOUNTER — Encounter: Payer: Self-pay | Admitting: Obstetrics and Gynecology

## 2017-12-14 ENCOUNTER — Ambulatory Visit (INDEPENDENT_AMBULATORY_CARE_PROVIDER_SITE_OTHER): Payer: Medicaid Other | Admitting: Obstetrics and Gynecology

## 2017-12-14 DIAGNOSIS — Z0000 Encounter for general adult medical examination without abnormal findings: Secondary | ICD-10-CM | POA: Insufficient documentation

## 2017-12-14 DIAGNOSIS — N898 Other specified noninflammatory disorders of vagina: Secondary | ICD-10-CM
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
## 2017-12-14 NOTE — Patient Instructions (Signed)

## 2017-12-14 NOTE — Progress Notes (Signed)
Pt complains of having changes in her vaginal area. She denies having any vaginal discharge or odor.

## 2018-02-26 ENCOUNTER — Other Ambulatory Visit: Payer: Self-pay | Admitting: Obstetrics and Gynecology

## 2018-02-26 DIAGNOSIS — Z3042 Encounter for surveillance of injectable contraceptive: Secondary | ICD-10-CM

## 2018-03-01 ENCOUNTER — Ambulatory Visit: Payer: Medicaid Other

## 2018-03-08 ENCOUNTER — Encounter: Payer: Self-pay | Admitting: Obstetrics and Gynecology

## 2018-03-08 ENCOUNTER — Other Ambulatory Visit (HOSPITAL_COMMUNITY)
Admission: RE | Admit: 2018-03-08 | Discharge: 2018-03-08 | Disposition: A | Discharge status: Home / Self Care | Payer: Medicaid Other | Source: Ambulatory Visit | Attending: Obstetrics and Gynecology | Admitting: Obstetrics and Gynecology

## 2018-03-08 ENCOUNTER — Ambulatory Visit (INDEPENDENT_AMBULATORY_CARE_PROVIDER_SITE_OTHER): Payer: Medicaid Other | Admitting: Obstetrics and Gynecology

## 2018-03-08 VITALS — BP 116/78 | HR 78 | Wt 167.0 lb

## 2018-03-08 DIAGNOSIS — Z01419 Encounter for gynecological examination (general) (routine) without abnormal findings: Secondary | ICD-10-CM

## 2018-03-08 DIAGNOSIS — Z3042 Encounter for surveillance of injectable contraceptive: Secondary | ICD-10-CM

## 2018-03-08 DIAGNOSIS — Z0000 Encounter for general adult medical examination without abnormal findings: Secondary | ICD-10-CM | POA: Diagnosis not present

## 2018-03-08 MED ORDER — MEDROXYPROGESTERONE ACETATE 150 MG/ML IM SUSP
150.0000 mg | Freq: Once | INTRAMUSCULAR | Status: AC
  Administered 2018-03-08: 150 mg via INTRAMUSCULAR

## 2018-03-08 MED ORDER — MEDROXYPROGESTERONE ACETATE 150 MG/ML IM SUSP: INTRAMUSCULAR | 4 refills | Status: DC

## 2018-03-08 NOTE — Progress Notes (Signed)
Subjective:     Brandi Henson is a 22 y.o. female G1P1 with BMI 24 and LMP 03/03/2018 who is here for a comprehensive physical exam. The patient reports no problems. She is sexually active using depo-provera for contraception. She denies pelvic pain or abnormal discharge.  Past Medical History:  Diagnosis Date  . Preterm labor    Past Surgical History:  Procedure Laterality Date  . NO PAST SURGERIES     Family History  Problem Relation Age of Onset  . Diabetes Maternal Grandfather      Social History   Socioeconomic History  . Marital status: Single    Spouse name: Not on file  . Number of children: Not on file  . Years of education: Not on file  . Highest education level: Not on file  Occupational History  . Not on file  Social Needs  . Financial resource strain: Not on file  . Food insecurity:    Worry: Not on file    Inability: Not on file  . Transportation needs:    Medical: Not on file    Non-medical: Not on file  Tobacco Use  . Smoking status: Never Smoker  . Smokeless tobacco: Never Used  Substance and Sexual Activity  . Alcohol use: No  . Drug use: Not Currently    Frequency: 3.0 times per week    Types: Marijuana  . Sexual activity: Yes    Partners: Male    Birth control/protection: Injection  Lifestyle  . Physical activity:    Days per week: Not on file    Minutes per session: Not on file  . Stress: Not on file  Relationships  . Social connections:    Talks on phone: Not on file    Gets together: Not on file    Attends religious service: Not on file    Active member of club or organization: Not on file    Attends meetings of clubs or organizations: Not on file    Relationship status: Not on file  . Intimate partner violence:    Fear of current or ex partner: Not on file    Emotionally abused: Not on file    Physically abused: Not on file    Forced sexual activity: Not on file  Other Topics Concern  . Not on file  Social History Narrative   . Not on file   Health Maintenance  Topic Date Due  . CHLAMYDIA SCREENING  08/03/2017  . INFLUENZA VACCINE  02/24/2018  . PAP SMEAR  05/27/2019  . TETANUS/TDAP  06/09/2026  . HIV Screening  Completed       Review of Systems Pertinent items are noted in HPI.   Objective:  Blood pressure 116/78, pulse 78, weight 167 lb (75.8 kg), last menstrual period 03/03/2018, not currently breastfeeding.     GENERAL: Well-developed, well-nourished female in no acute distress.  HEENT: Normocephalic, atraumatic. Sclerae anicteric.  NECK: Supple. Normal thyroid.  LUNGS: Clear to auscultation bilaterally.  HEART: Regular rate and rhythm. BREASTS: Symmetric in size. No palpable masses or lymphadenopathy, skin changes, or nipple drainage. ABDOMEN: Soft, nontender, nondistended. No organomegaly. PELVIC: Normal external female genitalia. Vagina is pink and rugated.  Normal discharge. Normal appearing cervix. Uterus is normal in size. No adnexal mass or tenderness. EXTREMITIES: No cyanosis, clubbing, or edema, 2+ distal pulses.    Assessment:    Healthy female exam.      Plan:    Pap smear with cultures collected Patient declined other STI screen  Patient will be contacted with abnormal results Continue depo-provera for contraception See After Visit Summary for Counseling Recommendations

## 2018-03-10 LAB — CYTOLOGY - PAP
CHLAMYDIA, DNA PROBE: NEGATIVE
Diagnosis: NEGATIVE
Neisseria Gonorrhea: NEGATIVE

## 2018-05-31 ENCOUNTER — Ambulatory Visit (INDEPENDENT_AMBULATORY_CARE_PROVIDER_SITE_OTHER): Payer: Medicaid Other

## 2018-05-31 VITALS — BP 129/80 | HR 94 | Wt 171.5 lb

## 2018-05-31 DIAGNOSIS — Z3042 Encounter for surveillance of injectable contraceptive: Secondary | ICD-10-CM

## 2018-05-31 MED ORDER — MEDROXYPROGESTERONE ACETATE 150 MG/ML IM SUSP
150.0000 mg | Freq: Once | INTRAMUSCULAR | Status: AC
  Administered 2018-05-31: 150 mg via INTRAMUSCULAR

## 2018-05-31 NOTE — Progress Notes (Signed)
I reviewed the nurses note and agree with the plan of care.   Harper, Charles A, MD 10/22/2017 10:46 AM  

## 2018-05-31 NOTE — Progress Notes (Signed)
DEPO given in LUOQ, tolerated well. Next Depo 01/21-02/10/2018  Administrations This Visit    medroxyPROGESTERone (DEPO-PROVERA) injection 150 mg    Admin Date 05/31/2018 Action Given Dose 150 mg Route Intramuscular Administered By Maretta Bees, RMA

## 2018-08-22 ENCOUNTER — Ambulatory Visit: Payer: Medicaid Other

## 2018-08-23 ENCOUNTER — Ambulatory Visit: Payer: Medicaid Other | Admitting: Obstetrics & Gynecology

## 2018-08-30 ENCOUNTER — Ambulatory Visit (INDEPENDENT_AMBULATORY_CARE_PROVIDER_SITE_OTHER): Payer: Medicaid Other | Admitting: Obstetrics & Gynecology

## 2018-08-30 ENCOUNTER — Encounter: Payer: Self-pay | Admitting: Obstetrics & Gynecology

## 2018-08-30 DIAGNOSIS — Z30011 Encounter for initial prescription of contraceptive pills: Secondary | ICD-10-CM | POA: Diagnosis not present

## 2018-08-30 MED ORDER — NORGESTIMATE-ETH ESTRADIOL 0.25-35 MG-MCG PO TABS: 1.0000 | ORAL_TABLET | Freq: Every day | ORAL | 11 refills | Status: DC

## 2018-08-30 NOTE — Progress Notes (Signed)
CC: wants to change to OCP 23 y.o. G1P1001 No LMP recorded. Patient has had an injection.  Pt is in the office to discuss [facility] options, currently on depo. Last depo injection 05-31-18, last pap 03-08-18. Pt reports weight gain and headaches while on depo . She would like to restart Sprintec. Rx was sent to her pharmacy.Questions were answered 10 minutes face to face and coordination of care  Adam Phenix, MD 08/30/2018

## 2018-08-30 NOTE — Patient Instructions (Signed)

## 2019-04-13 ENCOUNTER — Other Ambulatory Visit: Payer: Self-pay | Admitting: Obstetrics

## 2019-04-13 DIAGNOSIS — Z3042 Encounter for surveillance of injectable contraceptive: Secondary | ICD-10-CM

## 2019-05-15 ENCOUNTER — Other Ambulatory Visit: Payer: Self-pay

## 2019-05-15 DIAGNOSIS — Z20828 Contact with and (suspected) exposure to other viral communicable diseases: Secondary | ICD-10-CM | POA: Diagnosis not present

## 2019-05-15 DIAGNOSIS — Z20822 Contact with and (suspected) exposure to covid-19: Secondary | ICD-10-CM

## 2019-05-17 ENCOUNTER — Ambulatory Visit: Payer: Self-pay | Admitting: *Deleted

## 2019-05-17 LAB — NOVEL CORONAVIRUS, NAA: SARS-CoV-2, NAA: DETECTED — AB

## 2019-05-17 NOTE — Telephone Encounter (Signed)
Patient calls after learning she is positive covid19 and pregnant. Had questions regarding covid and pregnancy. Referred patient to her obgyn for additional information. Patient does not have a pcp. Offered to set up new patient appointment. She will call back to set up appointment at a later time.  Reason for Disposition . COVID-19 Disease, questions about  Answer Assessment - Initial Assessment Questions 1. COVID-19 DIAGNOSIS: "Who made your Coronavirus (COVID-19) diagnosis?" "Was it confirmed by a positive lab test?" If not diagnosed by a HCP, ask "Are there lots of cases (community spread) where you live?" (See public health department website, if unsure)     10/19 2. ONSET: "When did the COVID-19 symptoms start?"      10/19 3. WORST SYMPTOM: "What is your worst symptom?" (e.g., cough, fever, shortness of breath, muscle aches)     headache 4. COUGH: "Do you have a cough?" If so, ask: "How bad is the cough?"       no 5. FEVER: "Do you have a fever?" If so, ask: "What is your temperature, how was it measured, and when did it start?"     No 6. RESPIRATORY STATUS: "Describe your breathing?" (e.g., shortness of breath, wheezing, unable to speak)      normal 7. BETTER-SAME-WORSE: "Are you getting better, staying the same or getting worse compared to yesterday?"  If getting worse, ask, "In what way?"      8. HIGH RISK DISEASE: "Do you have any chronic medical problems?" (e.g., asthma, heart or lung disease, weak immune system, etc.)      9. PREGNANCY: "Is there any chance you are pregnant?" "When was your last menstrual period?"     yes 10. OTHER SYMPTOMS: "Do you have any other symptoms?"  (e.g., chills, fatigue, headache, loss of smell or taste, muscle pain, sore throat)       Loss of taste  Protocols used: CORONAVIRUS (COVID-19) DIAGNOSED OR SUSPECTED-A-AH

## 2019-05-18 ENCOUNTER — Other Ambulatory Visit: Payer: Self-pay

## 2019-05-18 DIAGNOSIS — Z20822 Contact with and (suspected) exposure to covid-19: Secondary | ICD-10-CM

## 2019-05-26 DIAGNOSIS — Z3200 Encounter for pregnancy test, result unknown: Secondary | ICD-10-CM | POA: Diagnosis not present

## 2019-05-26 DIAGNOSIS — Z3009 Encounter for other general counseling and advice on contraception: Secondary | ICD-10-CM | POA: Diagnosis not present

## 2019-05-30 ENCOUNTER — Other Ambulatory Visit: Payer: Self-pay

## 2019-05-30 DIAGNOSIS — Z20822 Contact with and (suspected) exposure to covid-19: Secondary | ICD-10-CM

## 2019-05-31 LAB — NOVEL CORONAVIRUS, NAA: SARS-CoV-2, NAA: NOT DETECTED

## 2019-06-01 ENCOUNTER — Other Ambulatory Visit: Payer: Self-pay

## 2019-06-01 DIAGNOSIS — Z20822 Contact with and (suspected) exposure to covid-19: Secondary | ICD-10-CM

## 2019-06-01 DIAGNOSIS — Z20828 Contact with and (suspected) exposure to other viral communicable diseases: Secondary | ICD-10-CM | POA: Diagnosis not present

## 2019-06-03 LAB — NOVEL CORONAVIRUS, NAA: SARS-CoV-2, NAA: NOT DETECTED

## 2019-06-04 ENCOUNTER — Other Ambulatory Visit: Payer: Self-pay

## 2019-06-04 ENCOUNTER — Encounter (HOSPITAL_COMMUNITY): Payer: Self-pay

## 2019-06-04 ENCOUNTER — Inpatient Hospital Stay (HOSPITAL_COMMUNITY)
Admission: AD | Admit: 2019-06-04 | Discharge: 2019-06-04 | Disposition: A | Discharge status: Home / Self Care | Payer: Medicaid Other | Attending: Obstetrics & Gynecology | Admitting: Obstetrics & Gynecology

## 2019-06-04 ENCOUNTER — Inpatient Hospital Stay (HOSPITAL_COMMUNITY): Payer: Medicaid Other

## 2019-06-04 DIAGNOSIS — O26899 Other specified pregnancy related conditions, unspecified trimester: Secondary | ICD-10-CM

## 2019-06-04 DIAGNOSIS — R1031 Right lower quadrant pain: Secondary | ICD-10-CM | POA: Insufficient documentation

## 2019-06-04 DIAGNOSIS — O26891 Other specified pregnancy related conditions, first trimester: Secondary | ICD-10-CM

## 2019-06-04 DIAGNOSIS — Z3A08 8 weeks gestation of pregnancy: Secondary | ICD-10-CM | POA: Diagnosis not present

## 2019-06-04 DIAGNOSIS — R109 Unspecified abdominal pain: Secondary | ICD-10-CM | POA: Diagnosis not present

## 2019-06-04 DIAGNOSIS — Z3201 Encounter for pregnancy test, result positive: Secondary | ICD-10-CM | POA: Diagnosis not present

## 2019-06-04 LAB — URINALYSIS, ROUTINE W REFLEX MICROSCOPIC
Bilirubin Urine: NEGATIVE
Glucose, UA: NEGATIVE mg/dL
Hgb urine dipstick: NEGATIVE
Ketones, ur: 20 mg/dL — AB
Leukocytes,Ua: NEGATIVE
Nitrite: NEGATIVE
Protein, ur: 30 mg/dL — AB
Specific Gravity, Urine: 1.032 — ABNORMAL HIGH (ref 1.005–1.030)
pH: 6 (ref 5.0–8.0)

## 2019-06-04 LAB — COMPREHENSIVE METABOLIC PANEL
ALT: 10 U/L (ref 0–44)
AST: 12 U/L — ABNORMAL LOW (ref 15–41)
Albumin: 3.4 g/dL — ABNORMAL LOW (ref 3.5–5.0)
Alkaline Phosphatase: 50 U/L (ref 38–126)
Anion gap: 8 (ref 5–15)
BUN: 6 mg/dL (ref 6–20)
CO2: 21 mmol/L — ABNORMAL LOW (ref 22–32)
Calcium: 9.1 mg/dL (ref 8.9–10.3)
Chloride: 104 mmol/L (ref 98–111)
Creatinine, Ser: 0.67 mg/dL (ref 0.44–1.00)
GFR calc Af Amer: >60 mL/min (ref >60)
GFR calc non Af Amer: >60 mL/min (ref >60)
Glucose, Bld: 82 mg/dL (ref 70–99)
Potassium: 3.8 mmol/L (ref 3.5–5.1)
Sodium: 133 mmol/L — ABNORMAL LOW (ref 135–145)
Total Bilirubin: 0.7 mg/dL (ref 0.3–1.2)
Total Protein: 6.5 g/dL (ref 6.5–8.1)

## 2019-06-04 LAB — WET PREP, GENITAL
Clue Cells Wet Prep HPF POC: NONE SEEN
Sperm: NONE SEEN
Trich, Wet Prep: NONE SEEN
WBC, Wet Prep HPF POC: NONE SEEN
Yeast Wet Prep HPF POC: NONE SEEN

## 2019-06-04 LAB — CBC
HCT: 34.3 % — ABNORMAL LOW (ref 36.0–46.0)
Hemoglobin: 11.5 g/dL — ABNORMAL LOW (ref 12.0–15.0)
MCH: 30.1 pg (ref 26.0–34.0)
MCHC: 33.5 g/dL (ref 30.0–36.0)
MCV: 89.8 fL (ref 80.0–100.0)
Platelets: 319 10*3/uL (ref 150–400)
RBC: 3.82 MIL/uL — ABNORMAL LOW (ref 3.87–5.11)
RDW: 11.9 % (ref 11.5–15.5)
WBC: 9.2 10*3/uL (ref 4.0–10.5)
nRBC: 0 % (ref 0.0–0.2)

## 2019-06-04 LAB — POCT PREGNANCY, URINE: Preg Test, Ur: POSITIVE — AB

## 2019-06-04 LAB — HCG, QUANTITATIVE, PREGNANCY: hCG, Beta Chain, Quant, S: 109575 m[IU]/mL — ABNORMAL HIGH (ref <5)

## 2019-06-04 MED ORDER — ACETAMINOPHEN 325 MG PO TABS
650.0000 mg | ORAL_TABLET | Freq: Once | ORAL | Status: AC
  Administered 2019-06-04: 22:00:00 650 mg via ORAL
  Filled 2019-06-04: qty 2

## 2019-06-04 NOTE — MAU Note (Signed)
Pt reports to mau with c/o right lower abd pain for the past 2 weeks that she rates 8/10.  Pt denies any vag bleeding or dc.

## 2019-06-04 NOTE — Discharge Instructions (Signed)
Abdominal Pain During Pregnancy ° °Belly (abdominal) pain is common during pregnancy. There are many possible causes. Most of the time, it is not a serious problem. Other times, it can be a sign that something is wrong with the pregnancy. Always tell your doctor if you have belly pain. °Follow these instructions at home: °· Do not have sex or put anything in your vagina until your pain goes away completely. °· Get plenty of rest until your pain gets better. °· Drink enough fluid to keep your pee (urine) pale yellow. °· Take over-the-counter and prescription medicines only as told by your doctor. °· Keep all follow-up visits as told by your doctor. This is important. °Contact a doctor if: °· Your pain continues or gets worse after resting. °· You have lower belly pain that: °? Comes and goes at regular times. °? Spreads to your back. °? Feels like menstrual cramps. °· You have pain or burning when you pee (urinate). °Get help right away if: °· You have a fever or chills. °· You have vaginal bleeding. °· You are leaking fluid from your vagina. °· You are passing tissue from your vagina. °· You throw up (vomit) for more than 24 hours. °· You have watery poop (diarrhea) for more than 24 hours. °· Your baby is moving less than usual. °· You feel very weak or faint. °· You have shortness of breath. °· You have very bad pain in your upper belly. °Summary °· Belly (abdominal) pain is common during pregnancy. There are many possible causes. °· If you have belly pain during pregnancy, tell your doctor right away. °· Keep all follow-up visits as told by your doctor. This is important. °This information is not intended to replace advice given to you by your health care provider. Make sure you discuss any questions you have with your health care provider. °Document Released: 07/01/2009 Document Revised: 10/31/2018 Document Reviewed: 10/15/2016 °Elsevier Patient Education © 2020 [employer] ° °

## 2019-06-04 NOTE — MAU Provider Note (Signed)
History     CSN: [id number]  Arrival date and time: 06/04/19 [id number]   First Provider Initiated Contact with Patient 06/04/19 2007      Chief Complaint  Patient presents with  . Abdominal Pain   Brandi Henson is a 23 yo G1P0 at 8.3 EGA who is presenting for RLQ pain. It has been intermittent for about 2 weeks. It does not radiate anywhere. She describes it as pressure when she coughs, sometimes feeling sharp and stabby. She denies nausea, vomiting, diarrhea, constipation, obstipation, hematochezia, or melena fevers, chills. She denies any vaginal dishcarge, bleeding, or cramping.   OB History    Gravida  2   Para  1   Term  1   Preterm      AB      Living  1     SAB      TAB      Ectopic      Multiple  0   Live Births  1           Past Medical History:  Diagnosis Date  . Preterm labor     Past Surgical History:  Procedure Laterality Date  . NO PAST SURGERIES      Family History  Problem Relation Age of Onset  . Diabetes Maternal Grandfather     Social History   Tobacco Use  . Smoking status: Never Smoker  . Smokeless tobacco: Never Used  Substance Use Topics  . Alcohol use: No  . Drug use: Not Currently    Frequency: 3.0 times per week    Types: Marijuana    Allergies: No Known Allergies  Medications Prior to Admission  Medication Sig Dispense Refill Last Dose  . medroxyPROGESTERone (DEPO-PROVERA) 150 MG/ML injection INJECT 1 ML INTO THE MUSCLE EVERY 3 MONTHS. 1 mL 3 More than a month at Unknown time  . norgestimate-ethinyl estradiol (ORTHO-CYCLEN,SPRINTEC,PREVIFEM) 0.25-35 MG-MCG tablet Take 1 tablet by mouth daily. 1 Package 11 More than a month at Unknown time    Review of Systems  All other systems reviewed and are negative.  Physical Exam   Blood pressure 108/67, pulse 97, temperature 98.6 F (37 C), temperature source Oral, resp. rate 16, weight 71.8 kg, last menstrual period 04/06/2019, SpO2 100 %.  Physical Exam  Nursing note and  vitals reviewed. Constitutional: She is oriented to person, place, and time. She appears well-developed and well-nourished.  Laughing on the phone when I entered the room  HENT:  Head: Normocephalic and atraumatic.  Eyes: Pupils are equal, round, and reactive to light. Conjunctivae and EOM are normal.  Neck: Normal range of motion. Neck supple.  Cardiovascular: Normal rate, regular rhythm, normal heart sounds and intact distal pulses.  Respiratory: Effort normal and breath sounds normal.  GI: Soft. Bowel sounds are normal. She exhibits no distension and no mass. There is abdominal tenderness (mild tenderness to the right inguinal region, elicited more on bimanual exam). There is no rebound and no guarding.  Musculoskeletal: Normal range of motion.  Neurological: She is alert and oriented to person, place, and time. She has normal reflexes.  Skin: Skin is warm and dry.  Psychiatric: She has a normal mood and affect. Her behavior is normal. Judgment and thought content normal.    MAU Course  Procedures  MDM -CMP, CBC, UA, Wet Prep, GC/CT swab -US -less likely appendicitis as no fevers, chills, and has stayed about the same over two weeks -Tylenol given for pain  [place] Ob Comp Less 14  Wks  Result Date: 06/04/2019 CLINICAL DATA:  Right lower quadrant pain and positive pregnancy test EXAM: OBSTETRIC <14 WK ULTRASOUND TECHNIQUE: Transabdominal ultrasound was performed for evaluation of the gestation as well as the maternal uterus and adnexal regions. COMPARISON:  None. FINDINGS: Intrauterine gestational sac: Single Yolk sac:  Present Embryo:  Present Cardiac Activity: Present Heart Rate: 170 bpm CRL:   21.6 mm   8 w 5 d                  [place] EDC: 01/09/2019 Subchorionic hemorrhage:  None visualized. Maternal uterus/adnexae: Uterus is retroverted. IMPRESSION: Single live intrauterine gestation at 8 weeks 5 days. Electronically Signed   By: Inez Catalina M.D.   On: 06/04/2019 21:01   Results for  orders placed or performed during the hospital encounter of 06/04/19 (from the past 24 hour(s))  Pregnancy, urine POC     Status: Abnormal   Collection Time: 06/04/19  6:05 PM  Result Value Ref Range   Preg Test, Ur POSITIVE (A) NEGATIVE  Urinalysis, Routine w reflex microscopic     Status: Abnormal   Collection Time: 06/04/19  7:13 PM  Result Value Ref Range   Color, Urine AMBER (A) YELLOW   APPearance CLOUDY (A) CLEAR   Specific Gravity, Urine 1.032 (H) 1.005 - 1.030   pH 6.0 5.0 - 8.0   Glucose, UA NEGATIVE NEGATIVE mg/dL   Hgb urine dipstick NEGATIVE NEGATIVE   Bilirubin Urine NEGATIVE NEGATIVE   Ketones, ur 20 (A) NEGATIVE mg/dL   Protein, ur 30 (A) NEGATIVE mg/dL   Nitrite NEGATIVE NEGATIVE   Leukocytes,Ua NEGATIVE NEGATIVE   RBC / HPF 0-5 0 - 5 RBC/hpf   WBC, UA 6-10 0 - 5 WBC/hpf   Bacteria, UA MANY (A) NONE SEEN   Squamous Epithelial / LPF 6-10 0 - 5   Mucus PRESENT   CBC     Status: Abnormal   Collection Time: 06/04/19  7:45 PM  Result Value Ref Range   WBC 9.2 4.0 - 10.5 K/uL   RBC 3.82 (L) 3.87 - 5.11 MIL/uL   Hemoglobin 11.5 (L) 12.0 - 15.0 g/dL   HCT 34.3 (L) 36.0 - 46.0 %   MCV 89.8 80.0 - 100.0 fL   MCH 30.1 26.0 - 34.0 pg   MCHC 33.5 30.0 - 36.0 g/dL   RDW 11.9 11.5 - 15.5 %   Platelets 319 150 - 400 K/uL   nRBC 0.0 0.0 - 0.2 %  hCG, quantitative, pregnancy     Status: Abnormal   Collection Time: 06/04/19  7:45 PM  Result Value Ref Range   hCG, Beta Chain, Quant, S 109,575 (H) <5 mIU/mL  Comprehensive metabolic panel     Status: Abnormal   Collection Time: 06/04/19  7:45 PM  Result Value Ref Range   Sodium 133 (L) 135 - 145 mmol/L   Potassium 3.8 3.5 - 5.1 mmol/L   Chloride 104 98 - 111 mmol/L   CO2 21 (L) 22 - 32 mmol/L   Glucose, Bld 82 70 - 99 mg/dL   BUN 6 6 - 20 mg/dL   Creatinine, Ser 0.67 0.44 - 1.00 mg/dL   Calcium 9.1 8.9 - 10.3 mg/dL   Total Protein 6.5 6.5 - 8.1 g/dL   Albumin 3.4 (L) 3.5 - 5.0 g/dL   AST 12 (L) 15 - 41 U/L   ALT 10 0  - 44 U/L   Alkaline Phosphatase 50 38 - 126 U/L   Total Bilirubin 0.7  0.3 - 1.2 mg/dL   GFR calc non Af Amer >60 >60 mL/min   GFR calc Af Amer >60 >60 mL/min   Anion gap 8 5 - 15  Wet prep, genital     Status: None   Collection Time: 06/04/19  8:26 PM   Specimen: Vaginal  Result Value Ref Range   Yeast Wet Prep HPF POC NONE SEEN NONE SEEN   Trich, Wet Prep NONE SEEN NONE SEEN   Clue Cells Wet Prep HPF POC NONE SEEN NONE SEEN   WBC, Wet Prep HPF POC NONE SEEN NONE SEEN   Sperm NONE SEEN     Assessment and Plan  23 yo G2P1001 at 8.3 EGA with RLQ abdominal pain -lab results normal -most likely irritation for Corpus Luteum on the right ovary -DC to home -Strict return precautions given  Brandi Henson 06/04/2019, 8:17 PM

## 2019-06-06 LAB — GC/CHLAMYDIA PROBE AMP (~~LOC~~) NOT AT ARMC
Chlamydia: NEGATIVE
Comment: NEGATIVE
Comment: NORMAL
Neisseria Gonorrhea: NEGATIVE

## 2019-06-14 ENCOUNTER — Ambulatory Visit (INDEPENDENT_AMBULATORY_CARE_PROVIDER_SITE_OTHER): Payer: Medicaid Other | Admitting: Advanced Practice Midwife

## 2019-06-14 DIAGNOSIS — Z3201 Encounter for pregnancy test, result positive: Secondary | ICD-10-CM

## 2019-06-14 MED ORDER — PNV PRENATAL PLUS MULTIVITAMIN 27-1 MG PO TABS
1.0000 | ORAL_TABLET | Freq: Every day | ORAL | 11 refills | Status: DC
Start: 2019-06-14 — End: 2021-04-24

## 2019-06-16 DIAGNOSIS — O0990 Supervision of high risk pregnancy, unspecified, unspecified trimester: Secondary | ICD-10-CM | POA: Insufficient documentation

## 2019-06-16 DIAGNOSIS — Z3480 Encounter for supervision of other normal pregnancy, unspecified trimester: Secondary | ICD-10-CM | POA: Insufficient documentation

## 2019-06-16 NOTE — Progress Notes (Signed)
TELEHEALTH VIRTUAL GYNECOLOGY VISIT ENCOUNTER NOTE  I connected with [name] on 06/16/19 at  1:15 PM EST by telephone at home and verified that I am speaking with the correct person using two identifiers.   I discussed the limitations, risks, security and privacy concerns of performing an evaluation and management service by telephone and the availability of in person appointments. I also discussed with the patient that there may be a patient responsible charge related to this service. The patient expressed understanding and agreed to proceed.   History:  Brandi Henson is a 23 y.o. G70P1001 female being evaluated today for New OB intake.  She had an [place] in MAU 11/8 (was having RLQ pain--presumably from CL cyst) which gave her an EDC of 01/19/20, now at 10.[redacted] weeks GA. RLQ pain is better. GC/CHL tests negative --Hx of term SVD in 6720 w/o complications.  --Hx PPD after last baby, no meds, resolved per pt --Pap normal Aug 2019 --Just started job at the [employer], has 90 day probationary period, but will probably want note to not have to work 12 hour days   She denies any abnormal vaginal discharge, bleeding, pelvic pain or other concerns.       Past Medical History:  Diagnosis Date  . Preterm labor    Past Surgical History:  Procedure Laterality Date  . NO PAST SURGERIES     The following portions of the patient's history were reviewed and updated as appropriate: allergies, current medications, past family history, past medical history, past social history, past surgical history and problem list.    Review of Systems:  Pertinent items noted in HPI and remainder of comprehensive ROS otherwise negative.  Physical Exam:   General:  Alert, oriented and cooperative.   Mental Status: Normal mood and affect perceived. Normal judgment and thought content.  Physical exam deferred due to nature of the encounter  Labs and Imaging Results for orders placed or performed during the  hospital encounter of 06/04/19 (from the past 336 hour(s))  Pregnancy, urine POC   Collection Time: 06/04/19  6:05 PM  Result Value Ref Range   Preg Test, Ur POSITIVE (A) NEGATIVE  GC/Chlamydia probe amp ([place])not at [facility]   Collection Time: 06/04/19  7:00 PM  Result Value Ref Range   Neisseria Gonorrhea Negative    Chlamydia Negative    Comment Normal Reference [place] Chlamydia - Negative    Comment      Normal Reference Range Neisseria Gonorrhea - Negative  Urinalysis, Routine w reflex microscopic   Collection Time: 06/04/19  7:13 PM  Result Value Ref Range   Color, Urine AMBER (A) YELLOW   APPearance CLOUDY (A) CLEAR   Specific Gravity, Urine 1.032 (H) 1.005 - 1.030   pH 6.0 5.0 - 8.0   Glucose, UA NEGATIVE NEGATIVE mg/dL   Hgb urine dipstick NEGATIVE NEGATIVE   Bilirubin Urine NEGATIVE NEGATIVE   Ketones, ur 20 (A) NEGATIVE mg/dL   Protein, ur 30 (A) NEGATIVE mg/dL   Nitrite NEGATIVE NEGATIVE   Leukocytes,Ua NEGATIVE NEGATIVE   RBC / HPF 0-5 0 - 5 RBC/hpf   WBC, UA 6-10 0 - 5 WBC/hpf   Bacteria, UA MANY (A) NONE SEEN   Squamous Epithelial / LPF 6-10 0 - 5   Mucus PRESENT   CBC   Collection Time: 06/04/19  7:45 PM  Result Value Ref Range   WBC 9.2 4.0 - 10.5 K/uL   RBC 3.82 (L) 3.87 - 5.11 MIL/uL  Hemoglobin 11.5 (L) 12.0 - 15.0 g/dL   HCT 80.3 (L) 21.2 - 24.8 %   MCV 89.8 80.0 - 100.0 fL   MCH 30.1 26.0 - 34.0 pg   MCHC 33.5 30.0 - 36.0 g/dL   RDW 25.0 03.7 - 04.8 %   Platelets 319 150 - 400 K/uL   nRBC 0.0 0.0 - 0.2 %  hCG, quantitative, pregnancy   Collection Time: 06/04/19  7:45 PM  Result Value Ref Range   hCG, Beta Chain, Quant, S 109,575 (H) <5 mIU/mL  Comprehensive metabolic panel   Collection Time: 06/04/19  7:45 PM  Result Value Ref Range   Sodium 133 (L) 135 - 145 mmol/L   Potassium 3.8 3.5 - 5.1 mmol/L   Chloride 104 98 - 111 mmol/L   CO2 21 (L) 22 - 32 mmol/L   Glucose, Bld 82 70 - 99 mg/dL   BUN 6 6 - 20 mg/dL   Creatinine, Ser 8.89  0.44 - 1.00 mg/dL   Calcium 9.1 8.9 - 16.9 mg/dL   Total Protein 6.5 6.5 - 8.1 g/dL   Albumin 3.4 (L) 3.5 - 5.0 g/dL   AST 12 (L) 15 - 41 U/L   ALT 10 0 - 44 U/L   Alkaline Phosphatase 50 38 - 126 U/L   Total Bilirubin 0.7 0.3 - 1.2 mg/dL   GFR calc non Af Amer >60 >60 mL/min   GFR calc Af Amer >60 >60 mL/min   Anion gap 8 5 - 15  Wet prep, genital   Collection Time: 06/04/19  8:26 PM   Specimen: Vaginal  Result Value Ref Range   Yeast Wet Prep HPF POC NONE SEEN NONE SEEN   Trich, Wet Prep NONE SEEN NONE SEEN   Clue Cells Wet Prep HPF POC NONE SEEN NONE SEEN   WBC, Wet Prep HPF POC NONE SEEN NONE SEEN   Sperm NONE SEEN    US Ob Comp Less 14 Wks  Result Date: 06/04/2019 CLINICAL DATA:  Right lower quadrant pain and positive pregnancy test EXAM: OBSTETRIC <14 WK ULTRASOUND TECHNIQUE: Transabdominal ultrasound was performed for evaluation of the gestation as well as the maternal uterus and adnexal regions. COMPARISON:  None. FINDINGS: Intrauterine gestational sac: Single Yolk sac:  Present Embryo:  Present Cardiac Activity: Present Heart Rate: 170 bpm CRL:   21.6 mm   8 w 5 d                  [place] EDC: 01/09/2019 Subchorionic hemorrhage:  None visualized. Maternal uterus/adnexae: Uterus is retroverted. IMPRESSION: Single live intrauterine gestation at 8 weeks 5 days. Electronically Signed   By: Alcide Clever M.D.   On: 06/04/2019 21:01      Assessment and Plan:     10.[redacted] weeks GA IUP F/U appt 12/14   I discussed the assessment and treatment plan with the patient. The patient was provided an opportunity to ask questions and all were answered. The patient agreed with the plan and demonstrated an understanding of the instructions.   The patient was advised to call back or seek an in-person evaluation/go to the ED if the symptoms worsen or if the condition fails to improve as anticipated.  I provided 30 minutes of non-face-to-face time during this encounter.   Jacklyn Shell, CNM  [facility] [employer], [facility]

## 2019-06-24 ENCOUNTER — Other Ambulatory Visit: Payer: Self-pay

## 2019-06-24 ENCOUNTER — Encounter (HOSPITAL_COMMUNITY): Payer: Self-pay | Admitting: *Deleted

## 2019-06-24 ENCOUNTER — Inpatient Hospital Stay (HOSPITAL_COMMUNITY)
Admission: AD | Admit: 2019-06-24 | Discharge: 2019-06-24 | Disposition: A | Discharge status: Home / Self Care | Payer: Medicaid Other | Attending: Obstetrics and Gynecology | Admitting: Obstetrics and Gynecology

## 2019-06-24 DIAGNOSIS — O26899 Other specified pregnancy related conditions, unspecified trimester: Secondary | ICD-10-CM

## 2019-06-24 DIAGNOSIS — Z3A11 11 weeks gestation of pregnancy: Secondary | ICD-10-CM | POA: Insufficient documentation

## 2019-06-24 DIAGNOSIS — O209 Hemorrhage in early pregnancy, unspecified: Secondary | ICD-10-CM | POA: Insufficient documentation

## 2019-06-24 DIAGNOSIS — R109 Unspecified abdominal pain: Secondary | ICD-10-CM | POA: Insufficient documentation

## 2019-06-24 DIAGNOSIS — O99611 Diseases of the digestive system complicating pregnancy, first trimester: Secondary | ICD-10-CM | POA: Insufficient documentation

## 2019-06-24 DIAGNOSIS — O26891 Other specified pregnancy related conditions, first trimester: Secondary | ICD-10-CM | POA: Diagnosis not present

## 2019-06-24 DIAGNOSIS — K5900 Constipation, unspecified: Secondary | ICD-10-CM | POA: Insufficient documentation

## 2019-06-24 LAB — URINALYSIS, ROUTINE W REFLEX MICROSCOPIC
Bilirubin Urine: NEGATIVE
Glucose, UA: NEGATIVE mg/dL
Hgb urine dipstick: NEGATIVE
Ketones, ur: 5 mg/dL — AB
Leukocytes,Ua: NEGATIVE
Nitrite: NEGATIVE
Protein, ur: 30 mg/dL — AB
Specific Gravity, Urine: 1.031 — ABNORMAL HIGH (ref 1.005–1.030)
pH: 6 (ref 5.0–8.0)

## 2019-06-24 NOTE — MAU Note (Signed)
Erin NP discussed work note with patient. RN gave patient work note with restrictions (written by NP). Patient to follow up with OB.

## 2019-06-24 NOTE — MAU Provider Note (Signed)
Chief Complaint: Abdominal Pain and Vaginal Bleeding   First Provider Initiated Contact with Patient 06/24/19 1315     SUBJECTIVE HPI: Brandi Henson is a 23 y.o. G2P1001 at [redacted]w[redacted]d who presents to Maternity Admissions reporting abdominal cramping & vaginal bleeding. Has had abdominal cramping intermittently for the last month. Denies fever/chills, vomiting, diarrhea, dysuria, or vaginal discharge. Can't remember when her last BM was but reports that she normally has issues with constipation & is currently not taking anything for it.  Pink spotting on toilet paper since this morning. Is not bleeding into a pad or passing clots. No recent intercourse or exams.   Location: abdomen Quality: cramping Severity: 8/10 on pain scale Duration: 1 month Timing: intermittent Modifying factors: none Associated signs and symptoms: vaginal bleeding & constipation  Past Medical History:  Diagnosis Date  . Preterm labor    OB History  Gravida Para Term Preterm AB Living  2 1 1     1   SAB TAB Ectopic Multiple Live Births        0 1    # Outcome Date GA Lbr Len/2nd Weight Sex Delivery Anes PTL Lv  2 Current           1 Term 08/18/16 [redacted]w[redacted]d 08:05 / 00:34 2866 g M Vag-Spont None  LIV   Past Surgical History:  Procedure Laterality Date  . NO PAST SURGERIES     Social History   Socioeconomic History  . Marital status: Single    Spouse name: Not on file  . Number of children: Not on file  . Years of education: Not on file  . Highest education level: Not on file  Occupational History  . Not on file  Social Needs  . Financial resource strain: Not on file  . Food insecurity    Worry: Not on file    Inability: Not on file  . Transportation needs    Medical: Not on file    Non-medical: Not on file  Tobacco Use  . Smoking status: Never Smoker  . Smokeless tobacco: Never Used  Substance and Sexual Activity  . Alcohol use: No  . Drug use: Not Currently    Frequency: 3.0 times per week     Types: Marijuana  . Sexual activity: Yes    Partners: Male    Birth control/protection: None  Lifestyle  . Physical activity    Days per week: Not on file    Minutes per session: Not on file  . Stress: Not on file  Relationships  . Social [redacted]w[redacted]d on phone: Not on file    Gets together: Not on file    Attends religious service: Not on file    Active member of club or organization: Not on file    Attends meetings of clubs or organizations: Not on file    Relationship status: Not on file  . Intimate partner violence    Fear of current or ex partner: Not on file    Emotionally abused: Not on file    Physically abused: Not on file    Forced sexual activity: Not on file  Other Topics Concern  . Not on file  Social History Narrative  . Not on file   Family History  Problem Relation Age of Onset  . Diabetes Maternal Grandfather   . Diabetes Mother   . Hypertension Mother    No current facility-administered medications on file prior to encounter.    Current Outpatient Medications on File  Prior to Encounter  Medication Sig Dispense Refill  . Prenatal Vit-Fe Fumarate-FA (PNV PRENATAL PLUS MULTIVITAMIN) 27-1 MG TABS Take 1 tablet by mouth daily. 30 tablet 11   No Known Allergies  I have reviewed patient's Past Medical Hx, Surgical Hx, Family Hx, Social Hx, medications and allergies.   Review of Systems  Constitutional: Negative.   Gastrointestinal: Positive for abdominal pain, constipation and nausea. Negative for diarrhea and vomiting.  Genitourinary: Positive for vaginal bleeding. Negative for dysuria and vaginal discharge.    OBJECTIVE Patient Vitals for the past 24 hrs:  BP Temp Temp src Pulse Resp SpO2  06/24/19 1442 104/64 - - 98 16 100 %  06/24/19 1227 126/67 98.7 F (37.1 C) Oral 98 16 100 %   Constitutional: Well-developed, well-nourished female in no acute distress.  Cardiovascular: normal rate & rhythm, no murmur Respiratory: normal rate and  effort. Lung sounds clear throughout GI: Abd soft, non-tender, Pos BS x 4. No guarding or rebound tenderness MS: Extremities nontender, no edema, normal ROM Neurologic: Alert and oriented x 4.  GU:     SPECULUM EXAM: NEFG, physiologic discharge, no blood noted, cervix clean. Cervix visually closed     LAB RESULTS Results for orders placed or performed during the hospital encounter of 06/24/19 (from the past 24 hour(s))  Urinalysis, Routine w reflex microscopic     Status: Abnormal   Collection Time: 06/24/19  1:36 PM  Result Value Ref Range   Color, Urine YELLOW YELLOW   APPearance CLEAR CLEAR   Specific Gravity, Urine 1.031 (H) 1.005 - 1.030   pH 6.0 5.0 - 8.0   Glucose, UA NEGATIVE NEGATIVE mg/dL   Hgb urine dipstick NEGATIVE NEGATIVE   Bilirubin Urine NEGATIVE NEGATIVE   Ketones, ur 5 (A) NEGATIVE mg/dL   Protein, ur 30 (A) NEGATIVE mg/dL   Nitrite NEGATIVE NEGATIVE   Leukocytes,Ua NEGATIVE NEGATIVE   RBC / HPF 0-5 0 - 5 RBC/hpf   WBC, UA 0-5 0 - 5 WBC/hpf   Bacteria, UA FEW (A) NONE SEEN   Squamous Epithelial / LPF 0-5 0 - 5   Mucus PRESENT     IMAGING No results found.  MAU COURSE Orders Placed This Encounter  Procedures  . Urinalysis, Routine w reflex microscopic  . Discharge patient   No orders of the defined types were placed in this encounter.   MDM FHT present via doppler  RH positive No blood on exam Patient had negative vaginal swabs earlier this month & declines need for them repeated today  ASSESSMENT 1. Vaginal bleeding in pregnancy, first trimester   2. [redacted] weeks gestation of pregnancy   3. Abdominal cramping affecting pregnancy     PLAN Discharge home in stable condition. Discussed reasons to return to MAU  Follow-up Information    [facility] Follow up.   Specialty: Obstetrics and Gynecology Why: return for worsening symptoms Contact information: [address] [id number] [place] [phone]         Allergies as of 06/24/2019   No Known Allergies     Medication List    TAKE these medications   PNV Prenatal Plus Multivitamin 27-1 MG Tabs Take 1 tablet by mouth daily.        Jorje Guild, NP 06/24/2019  2:53 PM

## 2019-06-24 NOTE — Discharge Instructions (Signed)
Abdominal Pain During Pregnancy  Belly (abdominal) pain is common during pregnancy. There are many possible causes. Most of the time, it is not a serious problem. Other times, it can be a sign that something is wrong with the pregnancy. Always tell your doctor if you have belly pain. Follow these instructions at home:  Do not have sex or put anything in your vagina until your pain goes away completely.  Get plenty of rest until your pain gets better.  Drink enough fluid to keep your pee (urine) pale yellow.  Take over-the-counter and prescription medicines only as told by your doctor.  Keep all follow-up visits as told by your doctor. This is important. Contact a doctor if:  Your pain continues or gets worse after resting.  You have lower belly pain that: ? Comes and goes at regular times. ? Spreads to your back. ? Feels like menstrual cramps.  You have pain or burning when you pee (urinate). Get help right away if:  You have a fever or chills.  You have vaginal bleeding.  You are leaking fluid from your vagina.  You are passing tissue from your vagina.  You throw up (vomit) for more than 24 hours.  You have watery poop (diarrhea) for more than 24 hours.  Your baby is moving less than usual.  You feel very weak or faint.  You have shortness of breath.  You have very bad pain in your upper belly. Summary  Belly (abdominal) pain is common during pregnancy. There are many possible causes.  If you have belly pain during pregnancy, tell your doctor right away.  Keep all follow-up visits as told by your doctor. This is important. This information is not intended to replace advice given to you by your health care provider. Make sure you discuss any questions you have with your health care provider. Document Released: 07/01/2009 Document Revised: 10/31/2018 Document Reviewed: 10/15/2016 Elsevier Patient Education  2020 [employer].  Vaginal Bleeding During Pregnancy,  First Trimester  A small amount of bleeding from the vagina (spotting) is relatively common during early pregnancy. It usually stops on its own. Various things may cause bleeding or spotting during early pregnancy. Some bleeding may be related to the pregnancy, and some may not. In many cases, the bleeding is normal and is not a problem. However, bleeding can also be a sign of something serious. Be sure to tell your health care provider about any vaginal bleeding right away. Some possible causes of vaginal bleeding during the first trimester include:  Infection or inflammation of the cervix.  Growths (polyps) on the cervix.  Miscarriage or threatened miscarriage.  Pregnancy tissue developing outside of the uterus (ectopic pregnancy).  A mass of tissue developing in the uterus due to an egg being fertilized incorrectly (molar pregnancy). Follow these instructions at home: Activity  Follow instructions from your health care provider about limiting your activity. Ask what activities are safe for you.  If needed, make plans for someone to help with your regular activities.  Do not have sex or orgasms until your health care provider says that this is safe. General instructions  Take over-the-counter and prescription medicines only as told by your health care provider.  Pay attention to any changes in your symptoms.  Do not use tampons or douche.  Write down how many pads you use each day, how often you change pads, and how soaked (saturated) they are.  If you pass any tissue from your vagina, save the tissue  so you can show it to your health care provider.  Keep all follow-up visits as told by your health care provider. This is important. Contact a health care provider if:  You have vaginal bleeding during any part of your pregnancy.  You have cramps or labor pains.  You have a fever. Get help right away if:  You have severe cramps in your back or abdomen.  You pass large  clots or a large amount of tissue from your vagina.  Your bleeding increases.  You feel light-headed or weak, or you faint.  You have chills.  You are leaking fluid or have a gush of fluid from your vagina. Summary  A small amount of bleeding (spotting) from the vagina is relatively common during early pregnancy.  Various things may cause bleeding or spotting in early pregnancy.  Be sure to tell your health care provider about any vaginal bleeding right away. This information is not intended to replace advice given to you by your health care provider. Make sure you discuss any questions you have with your health care provider. Document Released: 04/22/2005 Document Revised: 11/01/2018 Document Reviewed: 10/15/2016 Elsevier Patient Education  2020 [employer].

## 2019-06-24 NOTE — MAU Note (Signed)
Brandi Henson is a 23 y.o. at [redacted]w[redacted]d here in MAU reporting:  +lower abdominal cramping. Intermittent. Sharp. States it made her have to leave work. +vaginal spotting. Pink. When she wipes. Not having to wear a pad. SI over 24 hours ago Onset of complaint: ongoing. States was seen a couple of weeks ago for the cramping but came in today due to the spotting.  Pain score: 8/10 Vitals:   06/24/19 1227  BP: 126/67  Pulse: 98  Resp: 16  Temp: 98.7 F (37.1 C)  SpO2: 100%     FHT: 166 via doppler Lab orders placed from triage: ua. Patient had just went to the bathroom during registration. RN gave patient specimen cup and explained need. Patient eating during triage.

## 2019-06-24 NOTE — Progress Notes (Signed)
Discharge instructions reviewed. Patient verbalized understanding. Patient requesting work note with work hour reduction. Informed RN cannot write that letter but will discuss with provider.

## 2019-07-10 ENCOUNTER — Encounter: Payer: Self-pay | Admitting: Advanced Practice Midwife

## 2019-07-10 ENCOUNTER — Ambulatory Visit (INDEPENDENT_AMBULATORY_CARE_PROVIDER_SITE_OTHER): Payer: Medicaid Other | Admitting: Advanced Practice Midwife

## 2019-07-10 ENCOUNTER — Other Ambulatory Visit: Payer: Self-pay

## 2019-07-10 VITALS — BP 121/74 | HR 85 | Wt 156.2 lb

## 2019-07-10 DIAGNOSIS — Z3A13 13 weeks gestation of pregnancy: Secondary | ICD-10-CM

## 2019-07-10 DIAGNOSIS — O2611 Low weight gain in pregnancy, first trimester: Secondary | ICD-10-CM | POA: Insufficient documentation

## 2019-07-10 DIAGNOSIS — Z3480 Encounter for supervision of other normal pregnancy, unspecified trimester: Secondary | ICD-10-CM

## 2019-07-10 DIAGNOSIS — Z3481 Encounter for supervision of other normal pregnancy, first trimester: Secondary | ICD-10-CM | POA: Diagnosis not present

## 2019-07-10 DIAGNOSIS — O99891 Other specified diseases and conditions complicating pregnancy: Secondary | ICD-10-CM

## 2019-07-10 DIAGNOSIS — Z3482 Encounter for supervision of other normal pregnancy, second trimester: Secondary | ICD-10-CM | POA: Diagnosis not present

## 2019-07-10 DIAGNOSIS — O219 Vomiting of pregnancy, unspecified: Secondary | ICD-10-CM | POA: Insufficient documentation

## 2019-07-10 DIAGNOSIS — M549 Dorsalgia, unspecified: Secondary | ICD-10-CM | POA: Insufficient documentation

## 2019-07-10 MED ORDER — ENSURE PO LIQD: 237.0000 mL | Freq: Two times a day (BID) | ORAL | 12 refills | Status: DC

## 2019-07-10 MED ORDER — COMFORT FIT MATERNITY SUPP MED MISC: 1.0000 | Freq: Every day | 0 refills | Status: DC

## 2019-07-10 MED ORDER — METOCLOPRAMIDE HCL 10 MG PO TABS: 10.0000 mg | ORAL_TABLET | Freq: Three times a day (TID) | ORAL | 2 refills | Status: DC

## 2019-07-10 NOTE — Patient Instructions (Signed)
Eating Plan for Pregnant Women °While you are pregnant, your body requires additional nutrition to help support your growing baby. You also have a higher need for some vitamins and minerals, such as folic acid, calcium, iron, and vitamin D. Eating a healthy, well-balanced diet is very important for your health and your baby's health. Your need for extra calories varies for the three 3-month segments of your pregnancy (trimesters). For most women, it is recommended to consume: °· 150 extra calories a day during the first trimester. °· 300 extra calories a day during the second trimester. °· 300 extra calories a day during the third trimester. °What are tips for following this plan? ° °· Do not try to lose weight or go on a diet during pregnancy. °· Limit your overall intake of foods that have "empty calories." These are foods that have little nutritional value, such as sweets, desserts, candies, and sugar-sweetened beverages. °· Eat a variety of foods (especially fruits and vegetables) to get a full range of vitamins and minerals. °· Take a prenatal vitamin to help meet your additional vitamin and mineral needs during pregnancy, specifically for folic acid, iron, calcium, and vitamin D. °· Remember to stay active. Ask your health care provider what types of exercise and activities are safe for you. °· Practice good food safety and cleanliness. Wash your hands before you eat and after you prepare raw meat. Wash all fruits and vegetables well before peeling or eating. Taking these actions can help to prevent food-borne illnesses that can be very dangerous to your baby, such as listeriosis. Ask your health care provider for more information about listeriosis. °What does 150 extra calories look like? °Healthy options that provide 150 extra calories each day could be any of the following: °· 6-8 oz (170-230 g) of plain low-fat yogurt with ½ cup of berries. °· 1 apple with 2 teaspoons (11 g) of peanut butter. °· Cut-up  vegetables with ¼ cup (60 g) of hummus. °· 8 oz (230 mL) or 1 cup of low-fat chocolate milk. °· 1 stick of string cheese with 1 medium orange. °· 1 peanut butter and jelly sandwich that is made with one slice of whole-wheat bread and 1 tsp (5 g) of peanut butter. °For 300 extra calories, you could eat two of those healthy options each day. °What is a healthy amount of weight to gain? °The right amount of weight gain for you is based on your BMI before you became pregnant. If your BMI: °· Was less than 18 (underweight), you should gain 28-40 lb (13-18 kg). °· Was 18-24.9 (normal), you should gain 25-35 lb (11-16 kg). °· Was 25-29.9 (overweight), you should gain 15-25 lb (7-11 kg). °· Was 30 or greater (obese), you should gain 11-20 lb (5-9 kg). °What if I am having twins or multiples? °Generally, if you are carrying twins or multiples: °· You may need to eat 300-600 extra calories a day. °· The recommended range for total weight gain is 25-54 lb (11-25 kg), depending on your BMI before pregnancy. °· Talk with your health care provider to find out about nutritional needs, weight gain, and exercise that is right for you. °What foods can I eat? ° °Grains °All grains. Choose whole grains, such as whole-wheat bread, oatmeal, or brown rice. °Vegetables °All vegetables. Eat a variety of colors and types of vegetables. Remember to wash your vegetables well before peeling or eating. °Fruits °All fruits. Eat a variety of colors and types of fruit. Remember to wash   your fruits well before peeling or eating. °Meats and other protein foods °Lean meats, including chicken, turkey, fish, and lean cuts of beef, veal, or pork. If you eat fish or seafood, choose options that are higher in omega-3 fatty acids and lower in mercury, such as salmon, herring, mussels, trout, sardines, pollock, shrimp, crab, and lobster. Tofu. Tempeh. Beans. Eggs. Peanut butter and other nut butters. Make sure that all meats, poultry, and eggs are cooked to  food-safe temperatures or "well-done." °Two or more servings of fish are recommended each week in order to get the most benefits from omega-3 fatty acids that are found in seafood. Choose fish that are lower in mercury. You can find more information online: °· www.fda.gov °Dairy °Pasteurized milk and milk alternatives (such as almond milk). Pasteurized yogurt and pasteurized cheese. Cottage cheese. Sour cream. °Beverages °Water. Juices that contain 100% fruit juice or vegetable juice. Caffeine-free teas and decaffeinated coffee. °Drinks that contain caffeine are okay to drink, but it is better to avoid caffeine. Keep your total caffeine intake to less than 200 mg each day (which is 12 oz or 355 mL of coffee, tea, or soda) or the limit as told by your health care provider. °Fats and oils °Fats and oils are okay to include in moderation. °Sweets and desserts °Sweets and desserts are okay to include in moderation. °Seasoning and other foods °All pasteurized condiments. °The items listed above may not be a complete list of recommended foods and beverages. Contact your dietitian for more options. °The items listed above may not be a complete list of foods and beverages [you/your child] can eat. Contact a dietitian for more information. °What foods are not recommended? °Vegetables °Raw (unpasteurized) vegetable juices. °Fruits °Unpasteurized fruit juices. °Meats and other protein foods °Lunch meats, bologna, hot dogs, or other deli meats. (If you must eat those meats, reheat them until they are steaming hot.) Refrigerated paté, meat spreads from a meat counter, smoked seafood that is found in the refrigerated section of a store. Raw or undercooked meats, poultry, and eggs. Raw fish, such as sushi or sashimi. Fish that have high mercury content, such as tilefish, shark, swordfish, and king mackerel. °To learn more about mercury in fish, talk with your health care provider or look for online resources, such  as: °· www.fda.gov °Dairy °Raw (unpasteurized) milk and any foods that have raw milk in them. Soft cheeses, such as feta, queso blanco, queso fresco, Brie, Camembert cheeses, blue-veined cheeses, and Panela cheese (unless it is made with pasteurized milk, which must be stated on the label). °Beverages °Alcohol. Sugar-sweetened beverages, such as sodas, teas, or energy drinks. °Seasoning and other foods °Homemade fermented foods and drinks, such as pickles, sauerkraut, or kombucha drinks. (Store-bought pasteurized versions of these are okay.) °Salads that are made in a store or deli, such as ham salad, chicken salad, egg salad, tuna salad, and seafood salad. °The items listed above may not be a complete list of foods and beverages to avoid. Contact your dietitian for more information. °The items listed above may not be a complete list of foods and beverages [you/your child] should avoid. Contact a dietitian for more information. °Where to find more information °To calculate the number of calories you need based on your height, weight, and activity level, you can use an online calculator such as: °· www.choosemyplate.gov/MyPlatePlan °To calculate how much weight you should gain during pregnancy, you can use an online pregnancy weight gain calculator such as: °· www.choosemyplate.gov/pregnancy-weight-gain-calculator °Summary °· While you   are pregnant, your body requires additional nutrition to help support your growing baby. °· Eat a variety of foods, especially fruits and vegetables to get a full range of vitamins and minerals. °· Practice good food safety and cleanliness. Wash your hands before you eat and after you prepare raw meat. Wash all fruits and vegetables well before peeling or eating. Taking these actions can help to prevent food-borne illnesses, such as listeriosis, that can be very dangerous to your baby. °· Do not eat raw meat or fish. Do not eat fish that have high mercury content, such as tilefish,  shark, swordfish, and king mackerel. Do not eat unpasteurized (raw) dairy. °· Take a prenatal vitamin to help meet your additional vitamin and mineral needs during pregnancy, specifically for folic acid, iron, calcium, and vitamin D. °This information is not intended to replace advice given to you by your health care provider. Make sure you discuss any questions you have with your health care provider. °Document Released: 04/27/2014 Document Revised: 11/03/2018 Document Reviewed: 04/09/2017 °Elsevier Patient Education © 2020 Elsevier Inc. ° °

## 2019-07-10 NOTE — Progress Notes (Signed)
Subjective:   Brandi Henson is a 23 y.o. G2P1001 at 8232w6d by U/S being seen today for her first in-person obstetrical visit.  Her obstetrical history is significant for N/A and has Supervision of other normal pregnancy, antepartum; Nausea and vomiting during pregnancy prior to [redacted] weeks gestation; Poor weight gain of pregnancy, first trimester; and Back pain affecting pregnancy in first trimester on their problem list.. Patient does intend to breast feed and bottle feed with formula. Pregnancy history fully reviewed. Obstetrical hx is notable for SVD at 2336w6d in 2018 with no complications. No history of preeclampsia, GDM. Patient's current pregnancy course was complicated by COVID diagnosed on 10/19 with negative result on repeat testing x2 (11/3, 11/5). She was also seen in MAU on 11/8 for RLQ pain, presumed to be corpus luteum cysts.   Patient reports R lower back pain. She states that she ordered a new mattress and has taken Tylenol with relief, and requests a back brace.She also endorses cramps in RLQ that come and go. Has had 1 episode of spotting several weeks ago. She denies vaginal discharge. Patient also reports a 20lb weight loss in the last 9 weeks. She states that she is "not eating well" and has been eating mostly chips and snacks. She endorses nausea and vomiting with eating. She also endorses increased anxiety, sweating, and fatigue that are new in this pregnancy.  HISTORY: OB History  Gravida Para Term Preterm AB Living  2 1 1  0 0 1  SAB TAB Ectopic Multiple Live Births  0 0 0 0 1    # Outcome Date GA Lbr Len/2nd Weight Sex Delivery Anes PTL Lv  2 Current           1 Term 08/18/16 3336w6d 08:05 / 00:34 6 lb 5.1 oz (2.866 kg) M Vag-Spont None  LIV     Name: Slabach,BOY Raymona     Apgar1: 8  Apgar5: 9   Past Medical History:  Diagnosis Date  . Preterm labor    Past Surgical History:  Procedure Laterality Date  . NO PAST SURGERIES     Family History  Problem Relation  Age of Onset  . Diabetes Maternal Grandfather   . Diabetes Mother   . Hypertension Mother   . Healthy Father    Social History   Tobacco Use  . Smoking status: Never Smoker  . Smokeless tobacco: Never Used  Substance Use Topics  . Alcohol use: No  . Drug use: Not Currently    Frequency: 3.0 times per week    Types: Marijuana   No Known Allergies Current Outpatient Medications on File Prior to Visit  Medication Sig Dispense Refill  . Prenatal Vit-Fe Fumarate-FA (PNV PRENATAL PLUS MULTIVITAMIN) 27-1 MG TABS Take 1 tablet by mouth daily. 30 tablet 11   No current facility-administered medications on file prior to visit.     Indications for ASA therapy (per uptodate) One of the following: Previous pregnancy with preeclampsia, especially early onset and with an adverse outcome No Multifetal gestation No Chronic hypertension No  Type 1 or 2 diabetes mellitus No Chronic kidney disease No Autoimmune disease (antiphospholipid syndrome, systemic lupus erythematosus) No  Two or more of the following: Nulliparity No Obesity (body mass index >30 kg/m2) No  Family history of preeclampsia in mother or sister No  Age ?35 years No Sociodemographic characteristics (African American race, low socioeconomic level) Yes Personal risk factors (eg, previous pregnancy with low birth weight or small for gestational age infant,  previous adverse pregnancy outcome [eg, stillbirth], interval >10 years between pregnancies) No  Indications for early 1 hour GTT (per uptodate)  BMI >25 (>23 in Asian women) AND one of the following  Gestational diabetes mellitus in a previous pregnancy No Glycated hemoglobin ?5.7 percent (39 mmol/mol), impaired glucose tolerance, or impaired fasting glucose on previous testing No  First-degree relative with diabetes Yes High-risk race/ethnicity (eg, African American, Latino, Native American, [place] American, Pacific Islander) Yes History of cardiovascular disease  No Hypertension or on therapy for hypertension No High-density lipoprotein cholesterol level <35 mg/dL (0.90 mmol/L) and/or a triglyceride level >250 mg/dL (2.82 mmol/L) No Polycystic ovary syndrome No Physical inactivity No Other clinical condition associated with insulin resistance (eg, severe obesity, acanthosis nigricans) No Previous birth of an infant weighing ?4000 g No Previous stillbirth of unknown cause No Exam   Vitals:   07/10/19 0955  BP: 121/74  Pulse: 85  Weight: 156 lb 3.2 oz (70.9 kg)   Fetal Heart Rate (bpm): 145     Pelvic Exam:  Deferred  System: General: well-developed, well-nourished female in no acute distress   Breast:  normal appearance, no masses or tenderness   Skin: normal coloration and turgor, no rashes   Neurologic: oriented, normal, negative, normal mood   Extremities: normal strength, tone, and muscle mass, ROM of all joints is normal   HEENT PERRLA, extraocular movement intact and sclera clear, anicteric. No thyroid enlargement   Mouth/Teeth mucous membranes moist, pharynx normal without lesions and dental hygiene good   Neck supple and no masses   Cardiovascular: regular rate and rhythm   Respiratory:  no respiratory distress, normal breath sounds   Abdomen: soft, non-tender; bowel sounds normal; no masses,  no organomegaly     Assessment:   Pregnancy: G2P1001 Patient Active Problem List   Diagnosis Date Noted  . Nausea and vomiting during pregnancy prior to [redacted] weeks gestation 07/10/2019  . Poor weight gain of pregnancy, first trimester 07/10/2019  . Back pain affecting pregnancy in first trimester 07/10/2019  . Supervision of other normal pregnancy, antepartum 06/16/2019     Plan:   1. Supervision of other normal pregnancy, antepartum Ms. Borger is a 23yo G2P1 presenting at [redacted]w[redacted]d for her first in-person prenatal visit. We discussed the  nature of [place] with multiple MDs and other Advanced  Practice Providers was explained to patient; also emphasized that residents, students are part of our team. We also discussed the anticipated schedule for future visits and the possibility of telehealth encounters. Pap smear not performed at this visit due to normal pap in 2019. GC/Chlamydia not obtained given negative result obtained during [facility]U visit on 11/8. Initial labs drawn and patient was advised to continue prenatal vitamins. Discussed and offered genetic screening options, including Quad screen/AFP, NIPS testing, and option to decline testing. Benefits/risks/alternatives reviewed. Pt aware that anatomy US is form of genetic screening with lower accuracy in detecting trisomies than blood work.  Pt chooses genetic screening today. Ultrasound discussed; fetal anatomic survey ordered.              - Obstetric Panel (and HIV) - Culture, OB Urine - Genetic screening: [facility] or [facility] - Optimized Schedule - Babyscripts APP only - [place] MFM OB COMP + 14 WK - schedule at 19 weeks   2. Nausea and vomiting during pregnancy prior to [redacted] weeks gestation Pt's nausea and decreased appetite are likely related to hormonal fluctuations in pregnancy. The absence of abdominal  pain, fever, and normal fetal growth are reassuring. VS are stable and wnl with BP in normal range. Will check TSH as below. - Reglan 10mg  TID  3. Poor weight gain of pregnancy, first trimester Pt's 20 lb weight loss over 2 months in the setting of nausea and decreased appetite raise concern for insufficient weight gain during pregnancy. Weight loss is likely secondary to decreased caloric intake from nausea associated with pregnancy. Another possible etiology is irregular thyroid function, given associated increase in anxiety, lack of sleep, sweating. Lack of vomiting raises less concern for hyperemesis.  - Ensure (ENSURE); Take 237 mLs by mouth 2 (two) times daily between meals.  Dispense: 237 mL; Refill: 12 - TSH  4. Back pain affecting  pregnancy in first trimester Pt endorses continued lower back pain that is responsive to Tylenol. Given reproducible pain on physical exam and work at office requiring frequent body movement, the most likely cause is musculoskeletal. - Maternity support belt   [employer], Medical Student 07/10/19 10:58 AM   I confirm that I have verified the information documented in the medical student's note and that I have also personally reperformed the history, physical exam and all medical decision making activities of this service and have verified that all service and findings are accurately documented in this student's note.   --Next visit in person in 4 weeks for AFP  07/12/19, CNM 07/10/2019 11:56 AM

## 2019-07-10 NOTE — Progress Notes (Signed)
Pt presents for NOB visit. Flu vaccine offered; pt declined. Pt voiced no complaints today.

## 2019-07-11 LAB — OBSTETRIC PANEL, INCLUDING HIV
Antibody Screen: NEGATIVE
Basophils Absolute: 0 10*3/uL (ref 0.0–0.2)
Basos: 0 %
EOS (ABSOLUTE): 0.2 10*3/uL (ref 0.0–0.4)
Eos: 2 %
HIV Screen 4th Generation wRfx: NONREACTIVE
Hematocrit: 34.3 % (ref 34.0–46.6)
Hemoglobin: 11.8 g/dL (ref 11.1–15.9)
Hepatitis B Surface Ag: NEGATIVE
Immature Grans (Abs): 0 10*3/uL (ref 0.0–0.1)
Immature Granulocytes: 0 %
Lymphocytes Absolute: 1.6 10*3/uL (ref 0.7–3.1)
Lymphs: 24 %
MCH: 30.7 pg (ref 26.6–33.0)
MCHC: 34.4 g/dL (ref 31.5–35.7)
MCV: 89 fL (ref 79–97)
Monocytes Absolute: 0.5 10*3/uL (ref 0.1–0.9)
Monocytes: 7 %
Neutrophils Absolute: 4.5 10*3/uL (ref 1.4–7.0)
Neutrophils: 67 %
Platelets: 312 10*3/uL (ref 150–450)
RBC: 3.84 x10E6/uL (ref 3.77–5.28)
RDW: 11.8 % (ref 11.7–15.4)
RPR Ser Ql: NONREACTIVE
Rh Factor: POSITIVE
Rubella Antibodies, IGG: 4.62 index (ref >0.99)
WBC: 6.8 10*3/uL (ref 3.4–10.8)

## 2019-07-12 LAB — URINE CULTURE, OB REFLEX: Organism ID, Bacteria: NO GROWTH

## 2019-07-12 LAB — CULTURE, OB URINE

## 2019-07-14 ENCOUNTER — Telehealth: Payer: Self-pay | Admitting: *Deleted

## 2019-07-14 NOTE — Telephone Encounter (Signed)
Pt called to office with questions about natera site and results. Pt made aware that results are not yet back and she may check back with office.

## 2019-07-18 ENCOUNTER — Encounter: Payer: Self-pay | Admitting: Advanced Practice Midwife

## 2019-07-24 ENCOUNTER — Encounter: Payer: Self-pay | Admitting: Advanced Practice Midwife

## 2019-07-26 ENCOUNTER — Other Ambulatory Visit: Payer: Self-pay

## 2019-07-26 ENCOUNTER — Inpatient Hospital Stay (HOSPITAL_COMMUNITY)
Admission: AD | Admit: 2019-07-26 | Discharge: 2019-07-26 | Disposition: A | Discharge status: Home / Self Care | Payer: Medicaid Other | Attending: Obstetrics and Gynecology | Admitting: Obstetrics and Gynecology

## 2019-07-26 ENCOUNTER — Encounter (HOSPITAL_COMMUNITY): Payer: Self-pay | Admitting: Obstetrics and Gynecology

## 2019-07-26 DIAGNOSIS — Z79899 Other long term (current) drug therapy: Secondary | ICD-10-CM | POA: Insufficient documentation

## 2019-07-26 DIAGNOSIS — R630 Anorexia: Secondary | ICD-10-CM | POA: Diagnosis not present

## 2019-07-26 DIAGNOSIS — O99891 Other specified diseases and conditions complicating pregnancy: Secondary | ICD-10-CM | POA: Diagnosis not present

## 2019-07-26 DIAGNOSIS — R519 Headache, unspecified: Secondary | ICD-10-CM | POA: Diagnosis not present

## 2019-07-26 DIAGNOSIS — Z3A15 15 weeks gestation of pregnancy: Secondary | ICD-10-CM | POA: Insufficient documentation

## 2019-07-26 DIAGNOSIS — O2612 Low weight gain in pregnancy, second trimester: Secondary | ICD-10-CM | POA: Diagnosis not present

## 2019-07-26 DIAGNOSIS — O09212 Supervision of pregnancy with history of pre-term labor, second trimester: Secondary | ICD-10-CM | POA: Diagnosis not present

## 2019-07-26 DIAGNOSIS — R42 Dizziness and giddiness: Secondary | ICD-10-CM | POA: Diagnosis not present

## 2019-07-26 DIAGNOSIS — O26892 Other specified pregnancy related conditions, second trimester: Secondary | ICD-10-CM

## 2019-07-26 DIAGNOSIS — Z3480 Encounter for supervision of other normal pregnancy, unspecified trimester: Secondary | ICD-10-CM

## 2019-07-26 LAB — COMPREHENSIVE METABOLIC PANEL
ALT: 11 U/L (ref 0–44)
AST: 13 U/L — ABNORMAL LOW (ref 15–41)
Albumin: 3.2 g/dL — ABNORMAL LOW (ref 3.5–5.0)
Alkaline Phosphatase: 49 U/L (ref 38–126)
Anion gap: 7 (ref 5–15)
BUN: 6 mg/dL (ref 6–20)
CO2: 22 mmol/L (ref 22–32)
Calcium: 8.7 mg/dL — ABNORMAL LOW (ref 8.9–10.3)
Chloride: 107 mmol/L (ref 98–111)
Creatinine, Ser: 0.6 mg/dL (ref 0.44–1.00)
GFR calc Af Amer: >60 mL/min (ref >60)
GFR calc non Af Amer: >60 mL/min (ref >60)
Glucose, Bld: 78 mg/dL (ref 70–99)
Potassium: 4.3 mmol/L (ref 3.5–5.1)
Sodium: 136 mmol/L (ref 135–145)
Total Bilirubin: 0.9 mg/dL (ref 0.3–1.2)
Total Protein: 6.1 g/dL — ABNORMAL LOW (ref 6.5–8.1)

## 2019-07-26 LAB — CBC
HCT: 35.2 % — ABNORMAL LOW (ref 36.0–46.0)
Hemoglobin: 11.4 g/dL — ABNORMAL LOW (ref 12.0–15.0)
MCH: 29.9 pg (ref 26.0–34.0)
MCHC: 32.4 g/dL (ref 30.0–36.0)
MCV: 92.4 fL (ref 80.0–100.0)
Platelets: 284 10*3/uL (ref 150–400)
RBC: 3.81 MIL/uL — ABNORMAL LOW (ref 3.87–5.11)
RDW: 12.4 % (ref 11.5–15.5)
WBC: 6.4 10*3/uL (ref 4.0–10.5)
nRBC: 0 % (ref 0.0–0.2)

## 2019-07-26 LAB — TSH: TSH: 1.091 u[IU]/mL (ref 0.350–4.500)

## 2019-07-26 MED ORDER — LACTATED RINGERS IV BOLUS (SEPSIS)
1000.0000 mL | Freq: Once | INTRAVENOUS | Status: AC
  Administered 2019-07-26: 09:00:00 1000 mL via INTRAVENOUS

## 2019-07-26 MED ORDER — METOCLOPRAMIDE HCL 5 MG/ML IJ SOLN
10.0000 mg | Freq: Once | INTRAMUSCULAR | Status: AC
  Administered 2019-07-26: 09:00:00 10 mg via INTRAVENOUS
  Filled 2019-07-26: qty 2

## 2019-07-26 MED ORDER — DIPHENHYDRAMINE HCL 50 MG/ML IJ SOLN
25.0000 mg | Freq: Once | INTRAMUSCULAR | Status: AC
  Administered 2019-07-26: 25 mg via INTRAVENOUS
  Filled 2019-07-26: qty 1

## 2019-07-26 MED ORDER — DEXAMETHASONE SODIUM PHOSPHATE 10 MG/ML IJ SOLN
10.0000 mg | Freq: Once | INTRAMUSCULAR | Status: AC
  Administered 2019-07-26: 09:00:00 10 mg via INTRAVENOUS
  Filled 2019-07-26: qty 1

## 2019-07-26 NOTE — MAU Note (Signed)
RN enters pt room to start IV and give medications. Pt on the phone and states "it'll be a minute" and would like RN to come back later to start IV.

## 2019-07-26 NOTE — MAU Note (Signed)
Brandi Henson is a 23 y.o. at [redacted]w[redacted]d here in MAU reporting: since yesterday she has been feeling lightheaded, today it is worse and also having a headache. Took tylenol about an hour ago, unsure of the dose. Reports she is not eating well but is drinking water. States she was Rx some medication to stimulate her appetite but it is not working.   Onset of complaint: yesterday  Pain score: 7/10  Vitals:   07/26/19 0758  BP: (!) 92/56  Pulse: 82  Resp: 16  Temp: 98.2 F (36.8 C)  SpO2: 100%     FHT: 138  Lab orders placed from triage: UA, pt unable to give sample at this time

## 2019-07-26 NOTE — MAU Provider Note (Signed)
Chief Complaint: Dizziness and Headache   First Provider Initiated Contact with Patient 07/26/19 [phone]      SUBJECTIVE HPI: Brandi Henson is a 23 y.o. G2P1001 at [redacted]w[redacted]d by LMP who presents to maternity admissions reporting dizziness and headache starting yesterday.  She reports she is not eating much and has very little appetite. She tried the Reglan prescribed to her at her OB visit on 12/14 but it has not helped.  She is able to drink water and denies any n/v.  Her headache is frontal, constant, dull pain not resolved by Tylenol. The pain does not radiate. She is not sure what dose of Tylenol she took this morning.  She denies any hx of headaches or migraines.  She reports dizziness mostly when standing.  There are no other symptoms.      HPI  Past Medical History:  Diagnosis Date  . Preterm labor    Past Surgical History:  Procedure Laterality Date  . NO PAST SURGERIES     Social History   Socioeconomic History  . Marital status: Single    Spouse name: Not on file  . Number of children: Not on file  . Years of education: Not on file  . Highest education level: Not on file  Occupational History  . Not on file  Tobacco Use  . Smoking status: Never Smoker  . Smokeless tobacco: Never Used  Substance and Sexual Activity  . Alcohol use: No  . Drug use: Not Currently    Frequency: 3.0 times per week    Types: Marijuana  . Sexual activity: Yes    Partners: Male    Birth control/protection: None  Other Topics Concern  . Not on file  Social History Narrative  . Not on file   Social Determinants of Health   Financial Resource Strain:   . Difficulty of Paying Living Expenses: Not on file  Food Insecurity:   . Worried About Charity fundraiser in the Last Year: Not on file  . Ran Out of Food in the Last Year: Not on file  Transportation Needs:   . Lack of Transportation (Medical): Not on file  . Lack of Transportation (Non-Medical): Not on file  Physical Activity:   .  Days of Exercise per Week: Not on file  . Minutes of Exercise per Session: Not on file  Stress:   . Feeling of Stress : Not on file  Social Connections:   . Frequency of Communication with Friends and Family: Not on file  . Frequency of Social Gatherings with Friends and Family: Not on file  . Attends Religious Services: Not on file  . Active Member of Clubs or Organizations: Not on file  . Attends Archivist Meetings: Not on file  . Marital Status: Not on file  Intimate Partner Violence:   . Fear of Current or Ex-Partner: Not on file  . Emotionally Abused: Not on file  . Physically Abused: Not on file  . Sexually Abused: Not on file   No current facility-administered medications on file prior to encounter.   Current Outpatient Medications on File Prior to Encounter  Medication Sig Dispense Refill  . Elastic Bandages & Supports (COMFORT FIT MATERNITY SUPP MED) MISC 1 Device by Does not apply route daily. 1 each 0  . Ensure (ENSURE) Take 237 mLs by mouth 2 (two) times daily between meals. 237 mL 12  . metoCLOPramide (REGLAN) 10 MG tablet Take 1 tablet (10 mg total) by mouth 3 (three)  times daily before meals. 90 tablet 2  . Prenatal Vit-Fe Fumarate-FA (PNV PRENATAL PLUS MULTIVITAMIN) 27-1 MG TABS Take 1 tablet by mouth daily. 30 tablet 11   No Known Allergies  ROS:  Review of Systems  Constitutional: Positive for appetite change. Negative for chills, fatigue and fever.  Respiratory: Negative for shortness of breath.   Cardiovascular: Negative for chest pain.  Gastrointestinal: Negative for abdominal pain, constipation, diarrhea, nausea and vomiting.  Genitourinary: Negative for difficulty urinating, dysuria, flank pain, pelvic pain, vaginal bleeding, vaginal discharge and vaginal pain.  Neurological: Positive for dizziness, light-headedness and headaches.  Psychiatric/Behavioral: Negative.      I have reviewed patient's Past Medical Hx, Surgical Hx, Family Hx, Social  Hx, medications and allergies.   Physical Exam   Patient Vitals for the past 24 hrs:  BP Temp Temp src Pulse Resp SpO2 Height Weight  07/26/19 0758 (!) 92/56 98.2 F (36.8 C) Oral 82 16 100 % -- --  07/26/19 0746 -- -- -- -- -- -- 5\' 10"  (1.778 m) 69.7 kg   Constitutional: Well-developed, well-nourished female in no acute distress.  Cardiovascular: normal rate Respiratory: normal effort GI: Abd soft, non-tender. Pos BS x 4 MS: Extremities nontender, no edema, normal ROM Neurologic: Alert and oriented x 4.  GU: Neg CVAT.  PELVIC EXAM: Cervix pink, visually closed, without lesion, scant white creamy discharge, vaginal walls and external genitalia normal Bimanual exam: Cervix 0/long/high, firm, anterior, neg CMT, uterus nontender, nonenlarged, adnexa without tenderness, enlargement, or mass  FHT 138 by doppler  LAB RESULTS Results for orders placed or performed during the hospital encounter of 07/26/19 (from the past 24 hour(s))  CBC     Status: Abnormal   Collection Time: 07/26/19  9:02 AM  Result Value Ref Range   WBC 6.4 4.0 - 10.5 K/uL   RBC 3.81 (L) 3.87 - 5.11 MIL/uL   Hemoglobin 11.4 (L) 12.0 - 15.0 g/dL   HCT 21.335.2 (L) 08.636.0 - 57.846.0 %   MCV 92.4 80.0 - 100.0 fL   MCH 29.9 26.0 - 34.0 pg   MCHC 32.4 30.0 - 36.0 g/dL   RDW 46.912.4 62.911.5 - 52.815.5 %   Platelets 284 150 - 400 K/uL   nRBC 0.0 0.0 - 0.2 %  Comprehensive metabolic panel     Status: Abnormal   Collection Time: 07/26/19  9:02 AM  Result Value Ref Range   Sodium 136 135 - 145 mmol/L   Potassium 4.3 3.5 - 5.1 mmol/L   Chloride 107 98 - 111 mmol/L   CO2 22 22 - 32 mmol/L   Glucose, Bld 78 70 - 99 mg/dL   BUN 6 6 - 20 mg/dL   Creatinine, Ser 4.130.60 0.44 - 1.00 mg/dL   Calcium 8.7 (L) 8.9 - 10.3 mg/dL   Total Protein 6.1 (L) 6.5 - 8.1 g/dL   Albumin 3.2 (L) 3.5 - 5.0 g/dL   AST 13 (L) 15 - 41 U/L   ALT 11 0 - 44 U/L   Alkaline Phosphatase 49 38 - 126 U/L   Total Bilirubin 0.9 0.3 - 1.2 mg/dL   GFR calc non Af Amer  >60 >60 mL/min   GFR calc Af Amer >60 >60 mL/min   Anion gap 7 5 - 15  TSH     Status: None   Collection Time: 07/26/19  9:02 AM  Result Value Ref Range   TSH 1.091 0.350 - 4.500 uIU/mL    O/Positive/-- (12/14 1040)  IMAGING No results found.  MAU Management/MDM: Orders Placed This Encounter  Procedures  . Urinalysis, Routine w reflex microscopic  . CBC  . Comprehensive metabolic panel  . TSH  . Discharge patient    Meds ordered this encounter  Medications  . FOLLOWED BY Linked Order Group   . lactated ringers bolus 1,000 mL   . diphenhydrAMINE (BENADRYL) injection 25 mg   . metoCLOPramide (REGLAN) injection 10 mg   . dexamethasone (DECADRON) injection 10 mg    Pt unable to leave urine x 3 hours in MAU.  Pt is likely dehydrated, and dizziness/headache may be related to lack of food with low appetite.  TSH ordered at office visit 12/14 but result not available and not pending so will draw TSH again today along with CBC, CMP.  Headache cocktail given with IV LR, Benadryl 25 mg, Reglan 10 mg , and Decadron 10 mg.  Headache and dizziness completely resolved with fluids/medications.   TSH and other labs wnl.  Pt symptoms most likely caused by poor food intake.  Discussed food options to increase protein/calories daily.  Work note provided for pt to rest today and tomorrow.  F/U as scheduled at [facility], return to MAU for emergencies.    ASSESSMENT 1. Headache in pregnancy, antepartum, second trimester   2. Supervision of other normal pregnancy, antepartum   3. Poor weight gain of pregnancy, second trimester   4. Lack of appetite   5. Dizziness     PLAN Discharge home Allergies as of 07/26/2019   No Known Allergies     Medication List    TAKE these medications   Comfort Fit Maternity Supp Med Misc 1 Device by Does not apply route daily.   Ensure Take 237 mLs by mouth 2 (two) times daily between meals.   metoCLOPramide 10 MG tablet Commonly known as: Reglan Take 1  tablet (10 mg total) by mouth 3 (three) times daily before meals.   PNV Prenatal Plus Multivitamin 27-1 MG Tabs Take 1 tablet by mouth daily.      Follow-up Information    [facility] Follow up.   Why: As scheduled, return to MAU as needed for emergencies. Contact information: [address] [place] [address] [phone]          Sharen Counter Certified Nurse-Midwife 07/26/2019  11:11 AM

## 2019-07-28 NOTE — L&D Delivery Note (Signed)
OB/GYN Faculty Practice Delivery Note  Brandi Henson is a 24 y.o. G2P1001 s/p VD at [redacted]w[redacted]d. She was admitted for SOL.   ROM: 0h 9m with clear fluid GBS Status: Negative/-- (05/25 0435) Maximum Maternal Temperature: 98.25F  Labor Progress:  Initial SVE: 6.5/80/-1. AROM performed. She then progressed to complete.   Delivery Date/Time: 6/3 @ 1909 Delivery: Called to room and patient was complete and pushing. Head delivered in ROA position. No nuchal cord present. Shoulder and body delivered in usual fashion. Infant with spontaneous cry, placed on mother's abdomen, dried and stimulated. Cord clamped x 2 after 1-minute delay, and cut by [name]. Cord blood drawn. Placenta delivered spontaneously with gentle cord traction. Fundus firm with massage and Pitocin. Labia, perineum, vagina, and cervix inspected inspected with 2nd degree laceration which was repaired with 3-0 Vicryl in a standard fashion.  Baby Weight: pending  Placenta: Sent to L&D Complications: None Lacerations: 2nd degree perineal EBL: 384 mL Analgesia: Local Lidocaine   Infant:  APGAR (1 MIN): 9   APGAR (5 MINS): 9   APGAR (10 MINS):     Jerilynn Birkenhead, MD OB Family Medicine Fellow, [facility] for [facility], [facility] 12/28/2019, 7:58 PM

## 2019-08-07 ENCOUNTER — Ambulatory Visit (INDEPENDENT_AMBULATORY_CARE_PROVIDER_SITE_OTHER): Payer: Medicaid Other | Admitting: Advanced Practice Midwife

## 2019-08-07 ENCOUNTER — Other Ambulatory Visit: Payer: Self-pay

## 2019-08-07 VITALS — BP 100/62 | HR 88 | Wt 156.0 lb

## 2019-08-07 DIAGNOSIS — Z3A18 18 weeks gestation of pregnancy: Secondary | ICD-10-CM

## 2019-08-07 DIAGNOSIS — O2612 Low weight gain in pregnancy, second trimester: Secondary | ICD-10-CM

## 2019-08-07 DIAGNOSIS — Z3480 Encounter for supervision of other normal pregnancy, unspecified trimester: Secondary | ICD-10-CM | POA: Diagnosis not present

## 2019-08-07 NOTE — Progress Notes (Signed)
ROB   CC: NONE   

## 2019-08-07 NOTE — Progress Notes (Signed)
   PRENATAL VISIT NOTE  Subjective:  Brandi Henson is a 24 y.o. G2P1001 at [redacted]w[redacted]d being seen today for ongoing prenatal care.  She is currently monitored for the following issues for this low-risk pregnancy and has Supervision of other normal pregnancy, antepartum; Nausea and vomiting during pregnancy prior to [redacted] weeks gestation; Poor weight gain of pregnancy, first trimester; and Back pain affecting pregnancy in first trimester on their problem list.  Patient reports no complaints.  Contractions: Not present. Vag. Bleeding: None.  Movement: Present. Denies leaking of fluid.   The following portions of the patient's history were reviewed and updated as appropriate: allergies, current medications, past family history, past medical history, past social history, past surgical history and problem list.   Objective:   Vitals:   08/07/19 1045  BP: 100/62  Pulse: 88  Weight: 156 lb (70.8 kg)    Fetal Status:     Movement: Present     General:  Alert, oriented and cooperative. Patient is in no acute distress.  Skin: Skin is warm and dry. No rash noted.   Cardiovascular: Normal heart rate noted  Respiratory: Normal respiratory effort, no problems with respiration noted  Abdomen: Soft, gravid, appropriate for gestational age.  Pain/Pressure: Absent     Pelvic: Cervical exam deferred        Extremities: Normal range of motion.  Edema: None  Mental Status: Normal mood and affect. Normal behavior. Normal judgment and thought content.   Assessment and Plan:  Pregnancy: G2P1001 at [redacted]w[redacted]d 1. Supervision of other normal pregnancy, antepartum --Anticipatory guidance about next visits/weeks of pregnancy given. - AFP only (15.0-22.6)  2. Poor weight gain of pregnancy, second trimester --Pt doing better, gained 3 lbs at this visit.  TSH was wnl. --Discussed healthy eating, continue to gain appropriate amounts of weight, 25-35 lbs recommend.   Preterm labor symptoms and general obstetric  precautions including but not limited to vaginal bleeding, contractions, leaking of fluid and fetal movement were reviewed in detail with the patient. Please refer to After Visit Summary for other counseling recommendations.   Return in about 4 weeks (around 09/04/2019).  Future Appointments  Date Time Provider Department Center  08/15/2019 10:30 AM [facility] NURSE [facility]  08/15/2019 10:30 AM [facility] [place] 5 [facility]  09/04/2019  1:40 PM Leftwich-Kirby, Wilmer Floor, CNM [facility] None    Sharen Counter, CNM

## 2019-08-07 NOTE — Patient Instructions (Signed)

## 2019-08-09 LAB — AFP, SERUM, OPEN SPINA BIFIDA
AFP MoM: 0.73
AFP Value: 32.1 ng/mL
Gest. Age on Collection Date: 17.6 weeks
Maternal Age At EDD: 24.7 yr
OSBR Risk 1 IN: 10000
Test Results:: NEGATIVE
Weight: 156 [lb_av]

## 2019-08-15 ENCOUNTER — Other Ambulatory Visit (HOSPITAL_COMMUNITY): Payer: Self-pay | Admitting: *Deleted

## 2019-08-15 ENCOUNTER — Encounter (HOSPITAL_COMMUNITY): Payer: Self-pay

## 2019-08-15 ENCOUNTER — Other Ambulatory Visit: Payer: Self-pay

## 2019-08-15 ENCOUNTER — Ambulatory Visit (HOSPITAL_COMMUNITY)
Admission: RE | Admit: 2019-08-15 | Discharge: 2019-08-15 | Disposition: A | Discharge status: Home / Self Care | Payer: Medicaid Other | Source: Ambulatory Visit | Attending: Obstetrics and Gynecology | Admitting: Obstetrics and Gynecology

## 2019-08-15 ENCOUNTER — Ambulatory Visit (HOSPITAL_COMMUNITY): Payer: Medicaid Other

## 2019-08-15 DIAGNOSIS — O339 Maternal care for disproportion, unspecified: Secondary | ICD-10-CM | POA: Diagnosis not present

## 2019-08-15 DIAGNOSIS — Z3A18 18 weeks gestation of pregnancy: Secondary | ICD-10-CM

## 2019-08-15 DIAGNOSIS — Z362 Encounter for other antenatal screening follow-up: Secondary | ICD-10-CM

## 2019-08-15 DIAGNOSIS — Z3480 Encounter for supervision of other normal pregnancy, unspecified trimester: Secondary | ICD-10-CM

## 2019-08-15 DIAGNOSIS — Z363 Encounter for antenatal screening for malformations: Secondary | ICD-10-CM | POA: Diagnosis not present

## 2019-08-28 ENCOUNTER — Inpatient Hospital Stay (HOSPITAL_COMMUNITY)
Admission: AD | Admit: 2019-08-28 | Discharge: 2019-08-28 | Disposition: A | Discharge status: Home / Self Care | Payer: Medicaid Other | Attending: Obstetrics and Gynecology | Admitting: Obstetrics and Gynecology

## 2019-08-28 ENCOUNTER — Other Ambulatory Visit: Payer: Self-pay

## 2019-08-28 ENCOUNTER — Encounter (HOSPITAL_COMMUNITY): Payer: Self-pay | Admitting: Obstetrics and Gynecology

## 2019-08-28 ENCOUNTER — Inpatient Hospital Stay (HOSPITAL_BASED_OUTPATIENT_CLINIC_OR_DEPARTMENT_OTHER): Payer: Medicaid Other

## 2019-08-28 ENCOUNTER — Telehealth: Payer: Self-pay

## 2019-08-28 DIAGNOSIS — O98312 Other infections with a predominantly sexual mode of transmission complicating pregnancy, second trimester: Secondary | ICD-10-CM | POA: Insufficient documentation

## 2019-08-28 DIAGNOSIS — O26872 Cervical shortening, second trimester: Secondary | ICD-10-CM

## 2019-08-28 DIAGNOSIS — R102 Pelvic and perineal pain: Secondary | ICD-10-CM | POA: Insufficient documentation

## 2019-08-28 DIAGNOSIS — O26892 Other specified pregnancy related conditions, second trimester: Secondary | ICD-10-CM | POA: Diagnosis not present

## 2019-08-28 DIAGNOSIS — A630 Anogenital (venereal) warts: Secondary | ICD-10-CM | POA: Insufficient documentation

## 2019-08-28 DIAGNOSIS — Z362 Encounter for other antenatal screening follow-up: Secondary | ICD-10-CM

## 2019-08-28 DIAGNOSIS — R109 Unspecified abdominal pain: Secondary | ICD-10-CM | POA: Insufficient documentation

## 2019-08-28 DIAGNOSIS — Z3A20 20 weeks gestation of pregnancy: Secondary | ICD-10-CM | POA: Diagnosis not present

## 2019-08-28 DIAGNOSIS — O26899 Other specified pregnancy related conditions, unspecified trimester: Secondary | ICD-10-CM

## 2019-08-28 DIAGNOSIS — N883 Incompetence of cervix uteri: Secondary | ICD-10-CM | POA: Diagnosis present

## 2019-08-28 DIAGNOSIS — O26879 Cervical shortening, unspecified trimester: Secondary | ICD-10-CM

## 2019-08-28 LAB — URINALYSIS, ROUTINE W REFLEX MICROSCOPIC
Bilirubin Urine: NEGATIVE
Glucose, UA: NEGATIVE mg/dL
Hgb urine dipstick: NEGATIVE
Ketones, ur: NEGATIVE mg/dL
Nitrite: NEGATIVE
Protein, ur: NEGATIVE mg/dL
Specific Gravity, Urine: 1.025 (ref 1.005–1.030)
pH: 6 (ref 5.0–8.0)

## 2019-08-28 LAB — WET PREP, GENITAL
Clue Cells Wet Prep HPF POC: NONE SEEN
Sperm: NONE SEEN
Trich, Wet Prep: NONE SEEN
Yeast Wet Prep HPF POC: NONE SEEN

## 2019-08-28 NOTE — Telephone Encounter (Signed)
Blood pressure cuff reordered for pt to [facility]

## 2019-08-28 NOTE — Discharge Instructions (Signed)
Activity Restriction During Pregnancy Your health care provider may recommend specific activity restrictions during pregnancy for a variety of reasons. Activity restriction may require that you limit activities that require great effort, such as exercise, lifting, or sex. The type of activity restriction will vary for each person, depending on your risk or the problems you are having. Activity restriction may be recommended for a period of time until your baby is delivered. Why are activity restrictions recommended? Activity restriction may be recommended if:  Your placenta is partially or completely covering the opening of your cervix (placenta previa).  There is bleeding between the wall of the uterus and the amniotic sac in the first trimester of pregnancy (subchorionic hemorrhage).  You went into labor too early (preterm labor).  You have a history of miscarriage.  You have a condition that causes high blood pressure during pregnancy (preeclampsia or eclampsia).  You are pregnant with more than one baby.  Your baby is not growing well. What are the risks? The risks depend on your specific restriction. Strict bed rest has the most physical and emotional risks and is no longer routinely recommended. Risks of strict bed rest include:  Loss of muscle conditioning from not moving.  Blood clots.  Social isolation.  Depression.  Loss of income. Talk with your health care team about activity restriction to decide if it is best for you and your baby. Even if you are having problems during your pregnancy, you may be able to continue with normal levels of activity with careful monitoring by your health care team. Follow these instructions at home: If needed, based on your overall health and the health of your baby, your health care provider will decide which type of activity restriction is right for you. Activity restrictions may include:  Not lifting anything heavier than 10 pounds (4.5  kg).  Avoiding activities that take a lot of physical effort.  No lifting or straining.  Resting in a sitting position or lying down for periods of time during the day. Pelvic rest may be recommended along with activity restrictions. If pelvic rest is recommended, then:  Do not have sex, an orgasm, or use sexual stimulation.  Do not use tampons. Do not douche. Do not put anything into your vagina.  Do not lift anything that is heavier than 10 lb (4.5 kg).  Avoid activities that require a lot of effort.  Avoid any activity in which your pelvic muscles could become strained, such as squatting. Questions to ask your health care provider  Why is my activity being limited?  How will activity restrictions affect my body?  Why is rest helpful for me and my baby?  What activities can I do?  When can I return to normal activities? When should I seek immediate medical care? Seek immediate medical care if you have:  Vaginal bleeding.  Vaginal discharge.  Cramping pain in your lower abdomen.  Regular contractions.  A low, dull backache. Summary  Your health care provider may recommend specific activity restrictions during pregnancy for a variety of reasons.  Activity restriction may require that you limit activities such as exercise, lifting, sex, or any other activity that requires great effort.  Discuss the risks and benefits of activity restriction with your health care team to decide if it is best for you and your baby.  Contact your health care provider right away if you think you are having contractions, or if you notice vaginal bleeding, discharge, or cramping. This information is not   intended to replace advice given to you by your health care provider. Make sure you discuss any questions you have with your health care provider. Document Revised: 04/05/2019 Document Reviewed: 11/02/2017 Elsevier Patient Education  2020 Elsevier Inc.  

## 2019-08-28 NOTE — MAU Provider Note (Signed)
History     CSN: [id number]  Arrival date and time: 08/28/19 1426   First Provider Initiated Contact with Patient 08/28/19 1516      Chief Complaint  Patient presents with  . vag pressure  . Vaginal Discharge   HPI   [name] is a 24 y.o. female G2P1001 @ [redacted]w[redacted]d here with 3 days of vaginal pressure/ discomfort. Says today it made walking uncomfortable which made her come in the pressure is in her lower abdomen/ vaginal area. No urinary complaints. No bleeding. She has not tried any remidy's for the discomfort. Standing and walking makes the pressure worse. + white, thick discharge.   OB History    Gravida  2   Para  1   Term  1   Preterm      AB      Living  1     SAB      TAB      Ectopic      Multiple  0   Live Births  1           Past Medical History:  Diagnosis Date  . Preterm labor     Past Surgical History:  Procedure Laterality Date  . NO PAST SURGERIES      Family History  Problem Relation Age of Onset  . Diabetes Maternal Grandfather   . Diabetes Mother   . Hypertension Mother   . Healthy Father     Social History   Tobacco Use  . Smoking status: Never Smoker  . Smokeless tobacco: Never Used  Substance Use Topics  . Alcohol use: No  . Drug use: Not Currently    Frequency: 3.0 times per week    Types: Marijuana    Allergies: No Known Allergies  Medications Prior to Admission  Medication Sig Dispense Refill Last Dose  . Ensure (ENSURE) Take 237 mLs by mouth 2 (two) times daily between meals. 237 mL 12 08/28/2019 at Unknown time  . Prenatal Vit-Fe Fumarate-FA (PNV PRENATAL PLUS MULTIVITAMIN) 27-1 MG TABS Take 1 tablet by mouth daily. 30 tablet 11 08/28/2019 at Unknown time  . Elastic Bandages & Supports (COMFORT FIT MATERNITY SUPP MED) MISC 1 Device by Does not apply route daily. 1 each 0   . metoCLOPramide (REGLAN) 10 MG tablet Take 1 tablet (10 mg total) by mouth 3 (three) times daily before meals. 90 tablet 2 More than  a month at Unknown time   Results for orders placed or performed during the hospital encounter of 08/28/19 (from the past 48 hour(s))  Urinalysis, Routine w reflex microscopic     Status: Abnormal   Collection Time: 08/28/19  3:22 PM  Result Value Ref Range   Color, Urine YELLOW YELLOW   APPearance HAZY (A) CLEAR   Specific Gravity, Urine 1.025 1.005 - 1.030   pH 6.0 5.0 - 8.0   Glucose, UA NEGATIVE NEGATIVE mg/dL   Hgb urine dipstick NEGATIVE NEGATIVE   Bilirubin Urine NEGATIVE NEGATIVE   Ketones, ur NEGATIVE NEGATIVE mg/dL   Protein, ur NEGATIVE NEGATIVE mg/dL   Nitrite NEGATIVE NEGATIVE   Leukocytes,Ua TRACE (A) NEGATIVE   RBC / HPF 0-5 0 - 5 RBC/hpf   WBC, UA 6-10 0 - 5 WBC/hpf   Bacteria, UA MANY (A) NONE SEEN   Squamous Epithelial / LPF 6-10 0 - 5   Mucus PRESENT     Comment: Performed at [facility], [address] [place], [place] [address]  Wet prep, genital  Status: Abnormal   Collection Time: 08/28/19  3:35 PM   Specimen: Vaginal  Result Value Ref Range   Yeast Wet Prep HPF POC NONE SEEN NONE SEEN   Trich, Wet Prep NONE SEEN NONE SEEN   Clue Cells Wet Prep HPF POC NONE SEEN NONE SEEN   WBC, Wet Prep HPF POC MANY (A) NONE SEEN   Sperm NONE SEEN     Comment: Performed at [facility], [address], [place], [place] [address]   No results found.  Review of Systems  Gastrointestinal: Positive for abdominal pain.  Genitourinary: Positive for vaginal discharge. Negative for dysuria, flank pain, frequency, urgency and vaginal bleeding.   Physical Exam   Blood pressure 120/64, pulse 76, temperature 98.5 F (36.9 C), temperature source Oral, resp. rate 18, height 5\' 10"  (1.778 m), weight 73.4 kg, last menstrual period 04/06/2019, SpO2 100 %.  Physical Exam  Constitutional: She is oriented to person, place, and time. She appears well-developed and well-nourished. No distress.  HENT:  Head: Normocephalic.  Eyes: Pupils are equal, round, and reactive  to light.  GI: Soft. She exhibits no distension. There is no abdominal tenderness. There is no rebound and no guarding.  Genitourinary:    Genitourinary Comments: Several area of condyloma.  Vagina - Small amount of white vaginal discharge, no odor  Cervix - No contact bleeding, no active bleeding  Bimanual exam:  Cervix closed, palpates short.  GC/Chlam, wet prep done Chaperone present for exam.    Musculoskeletal:        General: Normal range of motion.  Neurological: She is alert and oriented to person, place, and time.  Skin: Skin is warm. She is not diaphoretic.  Psychiatric: Her behavior is normal.      MAU Course  Procedures  None  MDM  + fetal heart tones via doppler  06/06/2019 ordered Wet prep and GC collected Spoke to Dr. [place] with MFM who discussed transvaginal Parke Poisson. Cervix is 2.5 cm w/o funneling. He recommends f/u cervical length in 1 week.  The patient was informed of the findings.   Assessment and Plan   A:  1. Short cervix affecting pregnancy   2. Abdominal pain in pregnancy   3. [redacted] weeks gestation of pregnancy   4. Condyloma     P:  Discharge home with strict return precautions Ok to use tylenol OTC as directed on the bottle Ok to use Ibuprofen in the 2nd trimester only Return to MAU if symptoms worsen Keep your appointment with [place] on 2/8 4/8 ordered, [place] to be done in 1 week for cervical length Pelvic rest Work note given Message sent to office to have BP sent to pharmacy.  Urine culture pending   [place], NP 08/28/2019 9:26 PM

## 2019-08-28 NOTE — MAU Note (Signed)
Feeling pressure in vag area.  Feels like she is walking funny.  Has noted a white d/c, no odor or itching.

## 2019-08-29 LAB — CULTURE, OB URINE
Culture: NO GROWTH
Special Requests: NORMAL

## 2019-08-29 LAB — GC/CHLAMYDIA PROBE AMP (~~LOC~~) NOT AT ARMC
Chlamydia: NEGATIVE
Comment: NEGATIVE
Comment: NORMAL
Neisseria Gonorrhea: NEGATIVE

## 2019-09-04 ENCOUNTER — Ambulatory Visit (HOSPITAL_COMMUNITY): Payer: Medicaid Other | Admitting: *Deleted

## 2019-09-04 ENCOUNTER — Telehealth (INDEPENDENT_AMBULATORY_CARE_PROVIDER_SITE_OTHER): Payer: Medicaid Other | Admitting: Advanced Practice Midwife

## 2019-09-04 ENCOUNTER — Encounter (HOSPITAL_COMMUNITY): Payer: Self-pay | Admitting: *Deleted

## 2019-09-04 ENCOUNTER — Other Ambulatory Visit: Payer: Self-pay | Admitting: Family Medicine

## 2019-09-04 ENCOUNTER — Ambulatory Visit (HOSPITAL_COMMUNITY)
Admission: RE | Admit: 2019-09-04 | Discharge: 2019-09-04 | Disposition: A | Discharge status: Home / Self Care | Payer: Medicaid Other | Source: Ambulatory Visit | Attending: Obstetrics and Gynecology | Admitting: Obstetrics and Gynecology

## 2019-09-04 ENCOUNTER — Other Ambulatory Visit: Payer: Self-pay

## 2019-09-04 ENCOUNTER — Encounter: Payer: Self-pay | Admitting: Advanced Practice Midwife

## 2019-09-04 DIAGNOSIS — O26872 Cervical shortening, second trimester: Secondary | ICD-10-CM | POA: Diagnosis not present

## 2019-09-04 DIAGNOSIS — Z3A21 21 weeks gestation of pregnancy: Secondary | ICD-10-CM

## 2019-09-04 DIAGNOSIS — O26899 Other specified pregnancy related conditions, unspecified trimester: Secondary | ICD-10-CM | POA: Diagnosis not present

## 2019-09-04 DIAGNOSIS — Z3A20 20 weeks gestation of pregnancy: Secondary | ICD-10-CM | POA: Insufficient documentation

## 2019-09-04 DIAGNOSIS — O26879 Cervical shortening, unspecified trimester: Secondary | ICD-10-CM

## 2019-09-04 DIAGNOSIS — O26892 Other specified pregnancy related conditions, second trimester: Secondary | ICD-10-CM

## 2019-09-04 DIAGNOSIS — Z3480 Encounter for supervision of other normal pregnancy, unspecified trimester: Secondary | ICD-10-CM

## 2019-09-04 DIAGNOSIS — R109 Unspecified abdominal pain: Secondary | ICD-10-CM | POA: Insufficient documentation

## 2019-09-04 DIAGNOSIS — R102 Pelvic and perineal pain: Secondary | ICD-10-CM

## 2019-09-04 DIAGNOSIS — N949 Unspecified condition associated with female genital organs and menstrual cycle: Secondary | ICD-10-CM

## 2019-09-04 MED ORDER — PROGESTERONE 200 MG VA SUPP: 200.0000 mg | Freq: Every day | VAGINAL | 0 refills | Status: DC

## 2019-09-04 MED ORDER — COMFORT FIT MATERNITY SUPP MED MISC: 1.0000 | Freq: Every day | 0 refills | Status: DC

## 2019-09-04 NOTE — Progress Notes (Signed)
TELEHEALTH OBSTETRICS PRENATAL VIRTUAL VIDEO VISIT ENCOUNTER NOTE  [facility]   I connected with Brandi JewAlexis Henson on 09/04/19 at  1:40 PM EST by MyChart Video Encounter at home and verified that I am speaking with the correct person using two identifiers.   I discussed the limitations, risks, security and privacy concerns of performing an evaluation and management service virtually and the availability of in person appointments. I also discussed with the patient that there may be a patient responsible charge related to this service. The patient expressed understanding and agreed to proceed. Subjective:  Brandi Jewlexis Henson is a 24 y.o. G2P1001 at 5942w4d being seen today for ongoing prenatal care.  She is currently monitored for the following issues for this low-risk pregnancy and has Supervision of other normal pregnancy, antepartum; Nausea and vomiting during pregnancy prior to [redacted] weeks gestation; Poor weight gain of pregnancy, first trimester; Back pain affecting pregnancy in first trimester; and Short cervix affecting pregnancy on their problem list.  Patient reports constant pelvic pressure.  Contractions: Irritability. Vag. Bleeding: None.  Movement: Present. Denies any leaking of fluid.   The following portions of the patient's history were reviewed and updated as appropriate: allergies, current medications, past family history, past medical history, past social history, past surgical history and problem list.   Objective:  There were no vitals filed for this visit.  Fetal Status:     Movement: Present     General:  Alert, oriented and cooperative. Patient is in no acute distress.  Respiratory: Normal respiratory effort, no problems with respiration noted  Mental Status: Normal mood and affect. Normal behavior. Normal judgment and thought content.  Rest of physical exam deferred due to type of encounter  Imaging: US MFM OB  Transvaginal  Result Date: 08/29/2019 ----------------------------------------------------------------------  OBSTETRICS REPORT                        (Signed Final 08/29/2019 05:56 am) ---------------------------------------------------------------------- Patient Info  ID #:       [id number]                          D.O.B.:  05-14-1996 (23 yrs)  Name:       Brandi Henson                 Visit Date: 08/28/2019 03:41 pm ---------------------------------------------------------------------- Performed By  Performed By:     Hurman HornAlyssa Keatts          Ref. Address:      [address]                    [address]                                                              [address]                                                              [address] [phone]  [place]                                                              [place]  Attending:        Johnell Comings MD         [facility]  Referred By:      [facility] ---------------------------------------------------------------------- Orders   #  Description                          Code         Ordered By   1  [place] MFM OB TRANSVAGINAL               [phone]      [facility]  ----------------------------------------------------------------------   #  Order #                    Accession #                 Episode #   1  [id number]                  [id number]                  [id number]  ---------------------------------------------------------------------- Indications   Pelvic pain affecting pregnancy in second      O26.892   trimester (pressure)   [redacted] weeks gestation of pregnancy                Z3A.20  ---------------------------------------------------------------------- Fetal Evaluation  Num Of Fetuses:          1  Fetal Heart Rate(bpm):   144  Cardiac Activity:        Observed  Presentation:            Cephalic  Placenta:                 Anterior  P. Cord Insertion:       Visualized, central  Amniotic Fluid  AFI FV:      Subjectively upper-normal                              Largest Pocket(cm)                              8..09  Comment:    Stomach, bladder, and diaphragm noted. ---------------------------------------------------------------------- OB History  Gravidity:    2         Term:   1        Prem:   0        SAB:   0  TOP:          0  Ectopic:  0        Living: 1 ---------------------------------------------------------------------- Gestational Age  LMP:           20w 4d        Date:  04/06/19                 EDD:   01/11/20  Best:          Cherylann Parr 4d     Det. By:  LMP  (04/06/19)          EDD:   01/11/20 ---------------------------------------------------------------------- Cervix Uterus Adnexa  Cervix  Length:           2.57  cm.  Measured transvaginally.  Uterus  No abnormality visualized.  Left Ovary  Not visualized.  Right Ovary  Not visualized.  Adnexa  No abnormality visualized. ---------------------------------------------------------------------- Comments  This patient presented to the MAU due to lower abdominal  cramping. She has a prior full term birth.  On a transvaginal ultrasound performed today, her cervical  length measured 2.57 cm long without any signs of funneling.  There was a nabothian cyst noted in the cervix at the level of  the internal os.  The patient should have a follow up cervical length scheduled  in one week to determine if any further treatment is  necessary. ----------------------------------------------------------------------                   Ma Rings, MD Electronically Signed Final Report   08/29/2019 05:56 am ----------------------------------------------------------------------  [place] MFM OB COMP + 14 WK  Result Date: 08/16/2019 ----------------------------------------------------------------------  OBSTETRICS REPORT                        (Signed Final 08/16/2019 10:09 am)  ---------------------------------------------------------------------- Patient Info  ID #:       [id number]                          D.O.B.:  [birth year] (23 yrs)  Name:       Brandi Henson                 Visit Date: 08/15/2019 10:49 am ---------------------------------------------------------------------- Performed By  Performed By:     Percell Boston          Ref. Address:      [address]                                                              [phone]  [place]                                                              [address]  Attending:        Lin Landsman      [facility]  Referred By:      [facility] ---------------------------------------------------------------------- Orders   #  Description                          Code         Ordered By   1  [place] MFM OB COMP + 14 WK               76805.01     Norinne Jeane LEFTWICH-                                                        KIRBY  ----------------------------------------------------------------------   #  Order #                    Accession #                 Episode #   1  [id number]                  [id number]                  [id number]  ---------------------------------------------------------------------- Indications   [redacted] weeks gestation of pregnancy                Z3A.18   Encounter for antenatal screening for          Z36.3   malformations (low risk NIPS, 7.8FF, neg   AFP)  ---------------------------------------------------------------------- Fetal Evaluation  Num Of Fetuses:          1  Cardiac Activity:        Observed  Presentation:            Cephalic  Placenta:                Anterior  P. Cord Insertion:       Visualized, central  Amniotic Fluid  AFI FV:      Within normal limits                               Largest Pocket(cm)                              5.58 ---------------------------------------------------------------------- Biometry  BPD:      44.3  mm  G. Age:  19w 3d         79  %    CI:          77.5  %    70 - 86                                                          FL/HC:       18.6  %    16.1 - 18.3  HC:      159.3  mm     G. Age:  18w 5d         45  %    HC/AC:       1.09       1.09 - 1.39  AC:      146.6  mm     G. Age:  20w 0d         84  %    FL/BPD:      66.8  %  FL:       29.6  mm     G. Age:  19w 1d         59  %    FL/AC:       20.2  %    20 - 24  HUM:      30.2  mm     G. Age:  20w 0d         84  %  CER:      19.4  mm     G. Age:  18w 5d         50  %  CM:          4  mm  Est. FW:     295   gm   0 lb 10 oz      87  % ---------------------------------------------------------------------- OB History  Gravidity:    2         Term:   1        Prem:   0        SAB:   0  TOP:          0       Ectopic:  0        Living: 1 ---------------------------------------------------------------------- Gestational Age  LMP:           18w 5d        Date:  04/06/19                 EDD:   01/11/20  U/S Today:     19w 2d                                        EDD:   01/07/20  Best:          18w 5d     Det. By:  LMP  (04/06/19)          EDD:   01/11/20 ---------------------------------------------------------------------- Anatomy  Cranium:               Appears normal         LVOT:  Appears normal  Cavum:                 Appears normal         Aortic Arch:            Not well visualized  Ventricles:            Appears normal         Ductal Arch:            Appears normal  Choroid Plexus:        Appears normal         Diaphragm:              Appears normal  Cerebellum:            Appears normal         Stomach:                Appears normal, left                                                                        sided  Posterior Fossa:       Appears normal         Abdomen:                 Appears normal  Nuchal Fold:           Appears normal         Abdominal Wall:         Appears nml (cord                                                                        insert, abd wall)  Face:                  Appears normal         Cord Vessels:           Appears normal (3                         (orbits and profile)                           vessel cord)  Lips:                  Appears normal         Kidneys:                Appear normal  Palate:                Appears normal         Bladder:                Appears normal  Thoracic:              Appears normal         Spine:  Not well visualized  Heart:                 Appears normal         Upper Extremities:      Appears normal                         (4CH, axis, and                         situs)  RVOT:                  Appears normal         Lower Extremities:      Appears normal  Other:  Fetus appears to be a female. Heels visualized. Nasal bone visualized.          Technically difficult due to fetal position. ---------------------------------------------------------------------- Cervix Uterus Adnexa  Cervix  Length:            3.1  cm.  Normal appearance by transabdominal scan.  Uterus  No abnormality visualized.  Left Ovary  No adnexal mass visualized.  Right Ovary  No adnexal mass visualized.  Cul De Sac  No free fluid seen.  Adnexa  No abnormality visualized. ---------------------------------------------------------------------- Impression  Normal anatomy however, due to fetal position suboptimal  views of the fetal anatomy was obtained.  Normal fetal movement and amniotic fluid ---------------------------------------------------------------------- Recommendations  Follow up growth in 4 weeks. ----------------------------------------------------------------------               Lin Landsman, MD Electronically Signed Final Report   08/16/2019 10:09 am  ----------------------------------------------------------------------   Assessment and Plan:  Pregnancy: G2P1001 at [redacted]w[redacted]d 1. Short cervix affecting pregnancy --Pt presented to MAU 08/28/19 with pelvic pressure/contractions and cervix 2.57 cm.   --F/U US for cervical length scheduled today  2. Supervision of other normal pregnancy, antepartum --Anticipatory guidance about next visits/weeks of pregnancy given.   3. Pelvic pressure in pregnancy, antepartum, second trimester --Pt to [place] today, see above --Visit in the office in 1 week for closer observation  Preterm labor symptoms and general obstetric precautions including but not limited to vaginal bleeding, contractions, leaking of fluid and fetal movement were reviewed in detail with the patient. I discussed the assessment and treatment plan with the patient. The patient was provided an opportunity to ask questions and all were answered. The patient agreed with the plan and demonstrated an understanding of the instructions. The patient was advised to call back or seek an in-person office evaluation/go to [facility]U at [facility] for any urgent or concerning symptoms. Please refer to After Visit Summary for other counseling recommendations.   I provided 10 minutes of face-to-face time during this encounter.  No follow-ups on file.  Future Appointments  Date Time Provider Department Center  09/04/2019  3:45 PM WH-MFC [place] 5 WH-MFCUS MFC-US  09/12/2019  3:30 PM WH-MFC NURSE WH-MFC MFC-US  09/12/2019  3:30 PM WH-MFC [place] 1 WH-MFCUS MFC-US    Sharen Counter, CNM [facility]

## 2019-09-04 NOTE — Progress Notes (Signed)
Vaginal progesterone sent on behalf of Dr. Parke Poisson.  Marlowe Alt, DO OB Fellow, Faculty [facility] 09/04/2019 4:46 PM

## 2019-09-04 NOTE — Progress Notes (Signed)
I connected with  Brandi Henson on 09/04/19 by a video enabled telemedicine application and verified that I am speaking with the correct person using two identifiers.   I discussed the limitations of evaluation and management by telemedicine. The patient expressed understanding and agreed to proceed.   MyChart OB.  C/o pelvic pressure and cramping, wants [facility]

## 2019-09-05 ENCOUNTER — Other Ambulatory Visit (HOSPITAL_COMMUNITY): Payer: Self-pay | Admitting: *Deleted

## 2019-09-05 DIAGNOSIS — O26879 Cervical shortening, unspecified trimester: Secondary | ICD-10-CM

## 2019-09-07 ENCOUNTER — Telehealth: Payer: Self-pay | Admitting: *Deleted

## 2019-09-07 MED ORDER — BUTALBITAL-APAP-CAFFEINE 50-325-40 MG PO CAPS: 1.0000 | ORAL_CAPSULE | Freq: Four times a day (QID) | ORAL | 3 refills | Status: DC | PRN

## 2019-09-07 MED ORDER — BLOOD PRESSURE CUFF MISC: 1.0000 | Freq: Once | 0 refills | Status: AC

## 2019-09-07 NOTE — Telephone Encounter (Signed)
Spoke with pt regarding HA's she has been having since starting on vaginal progesterone. Pt states she is only taking progesterone and PNV at this time. Pt states she has had an ongoing HA since with no relief of OTC medication. Pt denies visual changes, swelling.  Pt states continued pelvic pressure and +FM.  Made aware message to be sent to provider today for further recommendations.   Blood pressure cuff will also be reordered today to correct pharmacy.       Please advise.

## 2019-09-08 ENCOUNTER — Telehealth: Payer: Self-pay | Admitting: *Deleted

## 2019-09-08 NOTE — Telephone Encounter (Signed)
Attempt to contact pt.  No answer, VM not set up.

## 2019-09-08 NOTE — Telephone Encounter (Signed)
Spoke with pt and advised of recommendations.  Advised to call office if no change in HA's.

## 2019-09-08 NOTE — Telephone Encounter (Signed)
Spoke with pt and made her aware of recommendations and Rx info. Advised that she may pick up BP cuff at [facility] as well.  Pt advised to call about HA's if no change with use of Rx.

## 2019-09-09 ENCOUNTER — Encounter (HOSPITAL_COMMUNITY): Payer: Self-pay | Admitting: Emergency Medicine

## 2019-09-09 ENCOUNTER — Emergency Department (HOSPITAL_COMMUNITY)
Admission: EM | Admit: 2019-09-09 | Discharge: 2019-09-09 | Disposition: A | Discharge status: Home / Self Care | Payer: Medicaid Other | Attending: Emergency Medicine | Admitting: Emergency Medicine

## 2019-09-09 ENCOUNTER — Other Ambulatory Visit: Payer: Self-pay

## 2019-09-09 DIAGNOSIS — Z79899 Other long term (current) drug therapy: Secondary | ICD-10-CM | POA: Diagnosis not present

## 2019-09-09 DIAGNOSIS — Z20822 Contact with and (suspected) exposure to covid-19: Secondary | ICD-10-CM | POA: Diagnosis not present

## 2019-09-09 DIAGNOSIS — R0981 Nasal congestion: Secondary | ICD-10-CM | POA: Diagnosis present

## 2019-09-09 DIAGNOSIS — B349 Viral infection, unspecified: Secondary | ICD-10-CM | POA: Insufficient documentation

## 2019-09-09 LAB — SARS CORONAVIRUS 2 (TAT 6-24 HRS): SARS Coronavirus 2: NEGATIVE

## 2019-09-09 MED ORDER — BENZONATATE 100 MG PO CAPS: 100.0000 mg | ORAL_CAPSULE | Freq: Three times a day (TID) | ORAL | 0 refills | Status: DC

## 2019-09-09 NOTE — ED Provider Notes (Signed)
[facility] EMERGENCY DEPARTMENT Provider Note   CSN: [id number] Arrival date & time: 09/09/19  6213     History Chief Complaint  Patient presents with  . Congestion    Brandi Henson is a 24 y.o. female   HPI  Patient is a 22-week pregnant 24 year old female no significant past medical history presented today for 3 days of chest congestion, cough, fatigue and congestion and sore throat.  Patient states that she has had no fevers or chills.  She states that her symptoms seem to be worse at night and keep her up so she is unable to get a good sleep.  She states that she has been eating and drinking less over the past couple days as well just because she has not felt like eating.  She denies any loss of taste or smell but states that she has a somewhat decreased appetite.  No abdominal pain, nausea, vomiting, headaches, dizziness.  She states that her pregnancy is going well so far with no complications.  She says she does receive prenatal care.  She denies any vaginal discharge and states that she does feel her baby moving.  She also denies any chest pain or shortness of breath.  She states that she is primarily here for medication recommendations that she was not sure if the Robitussin that she was taking was okay for pregnancy.    Past Medical History:  Diagnosis Date  . Preterm labor     Patient Active Problem List   Diagnosis Date Noted  . Short cervix affecting pregnancy 08/28/2019  . Nausea and vomiting during pregnancy prior to [redacted] weeks gestation 07/10/2019  . Poor weight gain of pregnancy, first trimester 07/10/2019  . Back pain affecting pregnancy in first trimester 07/10/2019  . Supervision of other normal pregnancy, antepartum 06/16/2019    Past Surgical History:  Procedure Laterality Date  . NO PAST SURGERIES       OB History    Gravida  2   Para  1   Term  1   Preterm      AB      Living  1     SAB      TAB      Ectopic      Multiple  0   Live Births  1           Family History  Problem Relation Age of Onset  . Diabetes Maternal Grandfather   . Diabetes Mother   . Hypertension Mother   . Healthy Father     Social History   Tobacco Use  . Smoking status: Never Smoker  . Smokeless tobacco: Never Used  Substance Use Topics  . Alcohol use: No  . Drug use: Not Currently    Frequency: 3.0 times per week    Types: Marijuana    Home Medications Prior to Admission medications   Medication Sig Start Date End Date Taking? Authorizing Provider  benzonatate (TESSALON) 100 MG capsule Take 1 capsule (100 mg total) by mouth every 8 (eight) hours. 09/09/19   Tedd Sias, PA  Butalbital-APAP-Caffeine [phone] MG capsule Take 1-2 capsules by mouth every 6 (six) hours as needed for headache. 09/07/19   Constant, Vickii Chafe, MD  Elastic Bandages & Supports (COMFORT FIT MATERNITY SUPP MED) MISC 1 Device by Does not apply route daily. 09/04/19   Leftwich-Kirby, Kathie Dike, CNM  Ensure (ENSURE) Take 237 mLs by mouth 2 (two) times daily between meals. Patient not taking: Reported  on 09/04/2019 07/10/19   Leftwich-KirbyWilmer Floor, CNM  metoCLOPramide (REGLAN) 10 MG tablet Take 1 tablet (10 mg total) by mouth 3 (three) times daily before meals. 07/10/19   Leftwich-Kirby, Wilmer Floor, CNM  Prenatal Vit-Fe Fumarate-FA (PNV PRENATAL PLUS MULTIVITAMIN) 27-1 MG TABS Take 1 tablet by mouth daily. 06/14/19   Cresenzo-Dishmon, Scarlette Calico, CNM  progesterone 200 MG SUPP Place 1 suppository (200 mg total) vaginally at bedtime. 09/04/19   Sparacino, Hailey L, DO    Allergies    Patient has no known allergies.  Review of Systems   Review of Systems  Constitutional: Positive for fatigue. Negative for chills and fever.  HENT: Positive for congestion and sore throat.   Eyes: Negative for pain.  Respiratory: Positive for cough. Negative for shortness of breath.   Cardiovascular: Negative for chest pain and leg swelling.  Gastrointestinal: Negative  for abdominal pain and vomiting.  Genitourinary: Negative for dysuria.  Musculoskeletal: Negative for myalgias.  Skin: Negative for rash.  Neurological: Negative for dizziness and headaches.    Physical Exam Updated Vital Signs BP 132/78 (BP Location: Right Arm)   Pulse 100   Temp 98.9 F (37.2 C) (Oral)   Resp 16   Ht 5\' 10"  (1.778 m)   Wt 69.9 kg   LMP 04/06/2019 (Exact Date)   SpO2 99%   BMI 22.10 kg/m   Physical Exam Vitals and nursing note reviewed.  Constitutional:      General: She is not in acute distress.    Comments: Patient is well-appearing 24 year old female clearly pregnant.  Pleasant, in no acute distress.  Sitting comfortably in fast track chair with no increased work of breathing.   HENT:     Head: Normocephalic and atraumatic.     Nose: Nose normal.     Mouth/Throat:     Mouth: Mucous membranes are moist.     Comments: Posterior pharynx with clear postnasal drainage no posterior pharynx injection or erythema, tonsils are normal size with no exudate Eyes:     General: No scleral icterus.    Extraocular Movements: Extraocular movements intact.  Cardiovascular:     Rate and Rhythm: Normal rate and regular rhythm.     Pulses: Normal pulses.     Heart sounds: Normal heart sounds.     Comments: Heart rate 92 Pulmonary:     Effort: Pulmonary effort is normal. No respiratory distress.     Breath sounds: Normal breath sounds. No wheezing.     Comments: Lungs clear to auscultation bilaterally, no increased work of breathing, speaking full sentences without difficulty. Abdominal:     Palpations: Abdomen is soft.     Tenderness: There is no abdominal tenderness.  Musculoskeletal:        General: Normal range of motion.     Cervical back: Normal range of motion.     Right lower leg: No edema.     Left lower leg: No edema.  Lymphadenopathy:     Cervical: No cervical adenopathy.  Skin:    General: Skin is warm and dry.     Capillary Refill: Capillary refill  takes less than 2 seconds.  Neurological:     Mental Status: She is alert. Mental status is at baseline.  Psychiatric:        Mood and Affect: Mood normal.        Behavior: Behavior normal.     ED Results / Procedures / Treatments   Labs (all labs ordered are listed, but only abnormal results are  displayed) Labs Reviewed  SARS CORONAVIRUS 2 (TAT 6-24 HRS)    EKG None  Radiology No results found.  Procedures Procedures (including critical care time)  Medications Ordered in ED Medications - No data to display  ED Course  I have reviewed the triage vital signs and the nursing notes.  Pertinent labs & imaging results that were available during my care of the patient were reviewed by me and considered in my medical decision making (see chart for details).    MDM Rules/Calculators/A&P                      Patient is well-appearing 24 year old female presented today for 3 days of cough, congestion, fatigue, decreased appetite and poor sleep due to the symptoms.  She states that she has been taking Robitussin, Tylenol, Lipton tea and cough lozenges with some improvement in her symptoms but states that she would need to be evaluated and is requesting medication recommendations.  Physical exam is wholly unremarkable.  She does not have any anterior cervical lymphadenopathy, enlarged tonsils, tonsillar exudate or pharyngeal erythema to indicate strep pharyngitis --Centor score of 0.  Her lungs are clear to auscultation bilaterally and she is afebrile with normal heart rate.  She is well-appearing.  She primarily wants recommendations for medications for her symptoms.  Doubt pneumonia as patient is afebrile, well-appearing has vitals within normal limits and is not coughing to my examination and has clear lung sounds auscultated in all lung fields.  Suspect viral URI/bronchitis very possibly COVID-19 but equally likely that it is some other virus.  I discussed with the patient that there  is very little helpful research on many medications use in pregnancy however that all the medication she is taking either fit into pregnancy category B or C.  I recommended benzonatate prescribed this to her and discussed with her that this is pregnancy category C and there is very little research but also no evidence of harm for the fetus.  She is understanding of plan which is to discharge her with outpatient Covid testing and conservative management.  She understands return precautions and will return to ED if she has any new or concerning symptoms.  We also discussed need for quarantine if her test comes back positive.  In the meantime she will quarantine.   Brandi Henson was evaluated in Emergency Department on 09/09/2019 for the symptoms described in the history of present illness. She was evaluated in the context of the global COVID-19 pandemic, which necessitated consideration that the patient might be at risk for infection with the SARS-CoV-2 virus that causes COVID-19. Institutional protocols and algorithms that pertain to the evaluation of patients at risk for COVID-19 are in a state of rapid change based on information released by regulatory bodies including the CDC and federal and state organizations. These policies and algorithms were followed during the patient's care in the ED.  Final Clinical Impression(s) / ED Diagnoses Final diagnoses:  Suspected COVID-19 virus infection  Viral illness    Rx / DC Orders ED Discharge Orders         Ordered    benzonatate (TESSALON) 100 MG capsule  Every 8 hours     09/09/19 0846           Gailen Shelter, PA 09/09/19 1540    Cathren Laine, MD 09/09/19 1402

## 2019-09-09 NOTE — ED Triage Notes (Signed)
Pt c/o chest congestion, states she couldn't sleep last night. Symptoms started yesterday. Pt coughing with phlegm.

## 2019-09-09 NOTE — Discharge Instructions (Addendum)
Please use Benadryl daily for congestion.  You may take 25 mg every 6 hours and you may take a 50 mg dose at bedtime.   Please continue your routine prenatal care--I have provided the phone [facility] for [facility] and [facility] if you need access to these.  Your Covid test results will complete in 24 hours.  If you have not received a phone call at that time you can assume that they are negative.  He can also check this on your [place] my chart.  Please use warm saline salt rinses with a Nettie pot or similar device as we discussed.  You can continue to use [place] throat lozenges and warm tea for your sore throat.  Please continue to eat and drink.  You may also continue to use Robitussin.  I am also prescribing you benzonatate-as we discussed there are very few studies done on the safety of benzonatate in pregnancy however in this case I think it is warranted to use as needed for your cough especially at nighttime.   Your COVID test is pending; you should expect results in 2-3 days. You can access your results on your MyChart--if you test positive you should receive a phone call.  In the meantime follow CDC guidelines and quarantine, wear a mask, wash hands often.   Please take over the counter vitamin D 2000-4000 units per day. I also recommend zinc 50 mg per day for the next two weeks.   Please return to ED if you feel have difficulty breathing or have emergent, new or concerning symptoms.  Patients who have symptoms consistent with COVID-19 should self isolated for: At least 3 days (72 hours) have passed since recovery, defined as resolution of fever without the use of fever reducing medications and improvement in respiratory symptoms (e.g., cough, shortness of breath), and At least 7 days have passed since symptoms first appeared.       Person Under Monitoring Name: Brandi Henson  Location: [address] [place] [address]   Infection  Prevention Recommendations for Individuals Confirmed to have, or Being Evaluated for, 2019 Novel Coronavirus (COVID-19) Infection Who Receive Care at Home  Individuals who are confirmed to have, or are being evaluated for, COVID-19 should follow the prevention steps below until a healthcare provider or local or state health department says they can return to normal activities.  Stay home except to get medical care You should restrict activities outside your home, except for getting medical care. Do not go to work, school, or public areas, and do not use public transportation or taxis.  Call ahead before visiting your doctor Before your medical appointment, call the healthcare provider and tell them that you have, or are being evaluated for, COVID-19 infection. This will help the healthcare provider's office take steps to keep other people from getting infected. Ask your healthcare provider to call the local or state health department.  Monitor your symptoms Seek prompt medical attention if your illness is worsening (e.g., difficulty breathing). Before going to your medical appointment, call the healthcare provider and tell them that you have, or are being evaluated for, COVID-19 infection. Ask your healthcare provider to call the local or state health department.  Wear a facemask You should wear a facemask that covers your nose and mouth when you are in the same room with other people and when you visit a healthcare provider. People who live with or visit you should also wear a  facemask while they are in the same room with you.  Separate yourself from other people in your home As much as possible, you should stay in a different room from other people in your home. Also, you should use a separate bathroom, if available.  Avoid sharing household items You should not share dishes, drinking glasses, cups, eating utensils, towels, bedding, or other items with other people in your home.  After using these items, you should wash them thoroughly with soap and water.  Cover your coughs and sneezes Cover your mouth and nose with a tissue when you cough or sneeze, or you can cough or sneeze into your sleeve. Throw used tissues in a lined trash can, and immediately wash your hands with soap and water for at least 20 seconds or use an alcohol-based hand rub.  Wash your [employer] your hands often and thoroughly with soap and water for at least 20 seconds. You can use an alcohol-based hand sanitizer if soap and water are not available and if your hands are not visibly dirty. Avoid touching your eyes, nose, and mouth with unwashed hands.   Prevention Steps for Caregivers and Household Members of Individuals Confirmed to have, or Being Evaluated for, COVID-19 Infection Being Cared for in the Home  If you live with, or provide care at home for, a person confirmed to have, or being evaluated for, COVID-19 infection please follow these guidelines to prevent infection:  Follow healthcare provider's instructions Make sure that you understand and can help the patient follow any healthcare provider instructions for all care.  Provide for the patient's basic needs You should help the patient with basic needs in the home and provide support for getting groceries, prescriptions, and other personal needs.  Monitor the patient's symptoms If they are getting sicker, call his or her medical provider and tell them that the patient has, or is being evaluated for, COVID-19 infection. This will help the healthcare provider's office take steps to keep other people from getting infected. Ask the healthcare provider to call the local or state health department.  Limit the number of people who have contact with the patient If possible, have only one caregiver for the patient. Other household members should stay in another home or place of residence. If this is not possible, they should stay in  another room, or be separated from the patient as much as possible. Use a separate bathroom, if available. Restrict visitors who do not have an essential need to be in the home.  Keep older adults, very young children, and other sick people away from the patient Keep older adults, very young children, and those who have compromised immune systems or chronic health conditions away from the patient. This includes people with chronic heart, lung, or kidney conditions, diabetes, and cancer.  Ensure good ventilation Make sure that shared spaces in the home have good air flow, such as from an air conditioner or an opened window, weather permitting.  Wash your hands often Wash your hands often and thoroughly with soap and water for at least 20 seconds. You can use an alcohol based hand sanitizer if soap and water are not available and if your hands are not visibly dirty. Avoid touching your eyes, nose, and mouth with unwashed hands. Use disposable paper towels to dry your hands. If not available, use dedicated cloth towels and replace them when they become wet.  Wear a facemask and gloves Wear a disposable facemask at all times in the room  and gloves when you touch or have contact with the patient's blood, body fluids, and/or secretions or excretions, such as sweat, saliva, sputum, nasal mucus, vomit, urine, or feces.  Ensure the mask fits over your nose and mouth tightly, and do not touch it during use. Throw out disposable facemasks and gloves after using them. Do not reuse. Wash your hands immediately after removing your facemask and gloves. If your personal clothing becomes contaminated, carefully remove clothing and launder. Wash your hands after handling contaminated clothing. Place all used disposable facemasks, gloves, and other waste in a lined container before disposing them with other household waste. Remove gloves and wash your hands immediately after handling these items.  Do not share  dishes, glasses, or other household items with the patient Avoid sharing household items. You should not share dishes, drinking glasses, cups, eating utensils, towels, bedding, or other items with a patient who is confirmed to have, or being evaluated for, COVID-19 infection. After the person uses these items, you should wash them thoroughly with soap and water.  Wash laundry thoroughly Immediately remove and wash clothes or bedding that have blood, body fluids, and/or secretions or excretions, such as sweat, saliva, sputum, nasal mucus, vomit, urine, or feces, on them. Wear gloves when handling laundry from the patient. Read and follow directions on labels of laundry or clothing items and detergent. In general, wash and dry with the warmest temperatures recommended on the label.  Clean all areas the individual has used often Clean all touchable surfaces, such as counters, tabletops, doorknobs, bathroom fixtures, toilets, phones, keyboards, tablets, and bedside tables, every day. Also, clean any surfaces that may have blood, body fluids, and/or secretions or excretions on them. Wear gloves when cleaning surfaces the patient has come in contact with. Use a diluted bleach solution (e.g., dilute bleach with 1 part bleach and 10 parts water) or a household disinfectant with a label that says EPA-registered for coronaviruses. To make a bleach solution at home, add 1 tablespoon of bleach to 1 quart (4 cups) of water. For a larger supply, add  cup of bleach to 1 gallon (16 cups) of water. Read labels of cleaning products and follow recommendations provided on product labels. Labels contain instructions for safe and effective use of the cleaning product including precautions you should take when applying the product, such as wearing gloves or eye protection and making sure you have good ventilation during use of the product. Remove gloves and wash hands immediately after cleaning.  Monitor yourself for  signs and symptoms of illness Caregivers and household members are considered close contacts, should monitor their health, and will be asked to limit movement outside of the home to the extent possible. Follow the monitoring steps for close contacts listed on the symptom monitoring form.   ? If you have additional questions, contact your local health department or call the epidemiologist on call at [phone] (available 24/7). ? This guidance is subject to change. For the most up-to-date guidance from [facility], please refer to their website: [email or web address]

## 2019-09-11 ENCOUNTER — Ambulatory Visit: Payer: Medicaid Other | Attending: Internal Medicine

## 2019-09-11 ENCOUNTER — Telehealth (INDEPENDENT_AMBULATORY_CARE_PROVIDER_SITE_OTHER): Payer: Medicaid Other

## 2019-09-11 VITALS — Wt 153.0 lb

## 2019-09-11 DIAGNOSIS — O26892 Other specified pregnancy related conditions, second trimester: Secondary | ICD-10-CM

## 2019-09-11 DIAGNOSIS — Z20822 Contact with and (suspected) exposure to covid-19: Secondary | ICD-10-CM

## 2019-09-11 DIAGNOSIS — Z3480 Encounter for supervision of other normal pregnancy, unspecified trimester: Secondary | ICD-10-CM

## 2019-09-11 DIAGNOSIS — O26872 Cervical shortening, second trimester: Secondary | ICD-10-CM

## 2019-09-11 DIAGNOSIS — N949 Unspecified condition associated with female genital organs and menstrual cycle: Secondary | ICD-10-CM

## 2019-09-11 DIAGNOSIS — O26879 Cervical shortening, unspecified trimester: Secondary | ICD-10-CM

## 2019-09-11 NOTE — Patient Instructions (Signed)
Activity Restriction During Pregnancy Your health care provider may recommend specific activity restrictions during pregnancy for a variety of reasons. Activity restriction may require that you limit activities that require great effort, such as exercise, lifting, or sex. The type of activity restriction will vary for each person, depending on your risk or the problems you are having. Activity restriction may be recommended for a period of time until your baby is delivered. Why are activity restrictions recommended? Activity restriction may be recommended if:  Your placenta is partially or completely covering the opening of your cervix (placenta previa).  There is bleeding between the wall of the uterus and the amniotic sac in the first trimester of pregnancy (subchorionic hemorrhage).  You went into labor too early (preterm labor).  You have a history of miscarriage.  You have a condition that causes high blood pressure during pregnancy (preeclampsia or eclampsia).  You are pregnant with more than one baby.  Your baby is not growing well. What are the risks? The risks depend on your specific restriction. Strict bed rest has the most physical and emotional risks and is no longer routinely recommended. Risks of strict bed rest include:  Loss of muscle conditioning from not moving.  Blood clots.  Social isolation.  Depression.  Loss of income. Talk with your health care team about activity restriction to decide if it is best for you and your baby. Even if you are having problems during your pregnancy, you may be able to continue with normal levels of activity with careful monitoring by your health care team. Follow these instructions at home: If needed, based on your overall health and the health of your baby, your health care provider will decide which type of activity restriction is right for you. Activity restrictions may include:  Not lifting anything heavier than 10 pounds (4.5  kg).  Avoiding activities that take a lot of physical effort.  No lifting or straining.  Resting in a sitting position or lying down for periods of time during the day. Pelvic rest may be recommended along with activity restrictions. If pelvic rest is recommended, then:  Do not have sex, an orgasm, or use sexual stimulation.  Do not use tampons. Do not douche. Do not put anything into your vagina.  Do not lift anything that is heavier than 10 lb (4.5 kg).  Avoid activities that require a lot of effort.  Avoid any activity in which your pelvic muscles could become strained, such as squatting. Questions to ask your health care provider  Why is my activity being limited?  How will activity restrictions affect my body?  Why is rest helpful for me and my baby?  What activities can I do?  When can I return to normal activities? When should I seek immediate medical care? Seek immediate medical care if you have:  Vaginal bleeding.  Vaginal discharge.  Cramping pain in your lower abdomen.  Regular contractions.  A low, dull backache. Summary  Your health care provider may recommend specific activity restrictions during pregnancy for a variety of reasons.  Activity restriction may require that you limit activities such as exercise, lifting, sex, or any other activity that requires great effort.  Discuss the risks and benefits of activity restriction with your health care team to decide if it is best for you and your baby.  Contact your health care provider right away if you think you are having contractions, or if you notice vaginal bleeding, discharge, or cramping. This information is not   intended to replace advice given to you by your health care provider. Make sure you discuss any questions you have with your health care provider. Document Revised: 04/05/2019 Document Reviewed: 11/02/2017 Elsevier Patient Education  2020 [employer]  

## 2019-09-11 NOTE — Progress Notes (Signed)
TELEHEALTH OBSTETRICS PRENATAL VIRTUAL VIDEO VISIT ENCOUNTER NOTE  [facility]   I connected with Brandi Henson on 09/11/19 at  4:00 PM EST by MyChart Video Encounter at home and verified that I am speaking with the correct person using two identifiers.   I discussed the limitations, risks, security and privacy concerns of performing an evaluation and management service virtually and the availability of in person appointments. I also discussed with the patient that there may be a patient responsible charge related to this service. The patient expressed understanding and agreed to proceed. Subjective:  Brandi Henson is a 24 y.o. G2P1001 at 6353w4d being seen today for ongoing prenatal care.  She is currently monitored for the following issues for this high-risk pregnancy and has Supervision of other normal pregnancy, antepartum; Nausea and vomiting during pregnancy prior to [redacted] weeks gestation; Poor weight gain of pregnancy, first trimester; Back pain affecting pregnancy in first trimester; and Short cervix affecting pregnancy on their problem list.   Patient questions why her cervix is still getting smaller. She reports that the pelvic pain remains the same.  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  She states she does wear her pregnancy belt, but it shifts while working and offers little to no support. [facility] and reports that her symptoms have not improved. She also reports that she continues to have headaches despite fiorcet dosing.   Patient reports no contractions and no cramping.   . Vag. Bleeding: None.  Movement: Present. Denies any leaking of fluid.   The following portions of the patient's history were reviewed and updated as appropriate: allergies, current medications, past family history, past medical  history, past social history, past surgical history and problem list.   Objective:   Vitals:   09/11/19 1540  Weight: 153 lb (69.4 kg)    Fetal Status:     Movement: Present     General:  Alert, oriented and cooperative. Patient is in no acute distress.  Respiratory: Normal respiratory effort, no problems with respiration noted  Mental Status: Normal mood and affect. Normal behavior. Normal judgment and thought content.  Rest of physical exam deferred due to type of encounter  Imaging: US MFM OB Transvaginal  Result Date: 09/04/2019 ----------------------------------------------------------------------  OBSTETRICS REPORT                       (Signed Final 09/04/2019 04:55 pm) ---------------------------------------------------------------------- Patient Info  ID #:       [id number]                          D.O.B.:  1995-09-24 (23 yrs)  Name:       Brandi Henson                 Visit Date: 09/04/2019 04:26 pm ---------------------------------------------------------------------- Performed By  Performed By:     Hurman HornAlyssa Keatts          Ref. Address:     [address] [place]                    RDMS  [address]                                                             [place] [address]                                                             [place]                                                             [address]  Attending:        Ma Rings MD         [facility]  Referred By:      [facility] ---------------------------------------------------------------------- Orders   #  Description                          Code         Ordered By   1  [place] MFM OB TRANSVAGINAL               [phone]      [facility]ENNIFER [facility]  ----------------------------------------------------------------------   #  Order #                    Accession #                 Episode #   1  [id number]                   [id number]                  [id number]  ---------------------------------------------------------------------- Indications   Cervical shortening, second trimester          O26.872   [redacted] weeks gestation of pregnancy                Z3A.21  ---------------------------------------------------------------------- Fetal Evaluation  Num Of Fetuses:         1  Fetal Heart Rate(bpm):  144  Cardiac Activity:       Observed  Presentation:           Cephalic  Placenta:               Anterior  P. Cord Insertion:      Visualized, central  Amniotic Fluid  AFI FV:      Within normal limits  Largest Pocket(cm)                              7.21  Comment:    Stomach, bladder, and diaphragm noted. ---------------------------------------------------------------------- OB History  Gravidity:    2         Term:   1        Prem:   0        SAB:   0  TOP:          0       Ectopic:  0        Living: 1 ---------------------------------------------------------------------- Gestational Age  LMP:           21w 4d        Date:  04/06/19                 EDD:   01/11/20  Best:          [place] 4d     Det. By:  LMP  (04/06/19)          EDD:   01/11/20 ---------------------------------------------------------------------- Cervix Uterus Adnexa  Cervix  Measured transvaginally. Dynamic (2.0-2.5cm)  Uterus  No abnormality visualized.  Left Ovary  Not visualized.  Right Ovary  Not visualized.  Adnexa  No abnormality visualized. ---------------------------------------------------------------------- Comments  This patient was seen for a cervical length measurement as  she presented to the MAU last week complaining of lower  abdominal cramping and lower abdominal pressure.  A  borderline cervical length measurement of 2.57 cm was noted  during that exam.  The patient reports that she continues to  experience lower abdominal pressure.  She reports 1 prior  full-term vaginal delivery.  On a transvaginal ultrasound  performed today, a dynamic  cervix of between 2.0 to 2.5 cm long was noted.  There were  no signs of cervical funneling noted today.  Due to her continued complaints of lower abdominal  cramping and pelvic pressure along with the sonographically  detected shortened cervix, daily vaginal progesterone  (Prometrium 200 mg daily) was prescribed with the hopes  that the medication would decrease her risk of a preterm  birth.  The patient was advised that as there were no signs of  cervical funneling, I believe that her risk of a preterm birth is  low.  A follow-up growth scan and cervical length measurement  was scheduled in 2 weeks. ----------------------------------------------------------------------                   Ma Rings, MD Electronically Signed Final Report   09/04/2019 04:55 pm ----------------------------------------------------------------------  [place] MFM OB Transvaginal  Result Date: 08/29/2019 ----------------------------------------------------------------------  OBSTETRICS REPORT                        (Signed Final 08/29/2019 05:56 am) ---------------------------------------------------------------------- Patient Info  ID #:       [id number]                          D.O.B.:  1996-01-06 (23 yrs)  Name:       Brandi Jew                 Visit Date: 08/28/2019 03:41 pm ---------------------------------------------------------------------- Performed By  Performed By:     Hurman Horn          Ref. Address:      [address]                                                              [address]                                                              [place]                                                              [address]  Attending:        Ma Rings MD         [facility]  Referred By:      [facility] ---------------------------------------------------------------------- Orders   #  Description                          Code         Ordered By   1  [place] MFM OB TRANSVAGINAL               [phone]      [facility]  ----------------------------------------------------------------------   #  Order #                    Accession #                 Episode #   1  [id number]                  [id number]                  [id number]  ---------------------------------------------------------------------- Indications   Pelvic pain affecting pregnancy in second      O26.892   trimester (pressure)   [redacted] weeks gestation of pregnancy                Z3A.20  ---------------------------------------------------------------------- Fetal Evaluation  Num Of Fetuses:  1  Fetal Heart Rate(bpm):   144  Cardiac Activity:        Observed  Presentation:            Cephalic  Placenta:                Anterior  P. Cord Insertion:       Visualized, central  Amniotic Fluid  AFI FV:      Subjectively upper-normal                              Largest Pocket(cm)                              8..09  Comment:    Stomach, bladder, and diaphragm noted. ---------------------------------------------------------------------- OB History  Gravidity:    2         Term:   1        Prem:   0        SAB:   0  TOP:          0       Ectopic:  0        Living: 1 ---------------------------------------------------------------------- Gestational Age  LMP:           20w 4d        Date:  04/06/19                 EDD:   01/11/20  Best:          Cherylann Parr20w 4d     Det. By:  LMP  (04/06/19)          EDD:   01/11/20 ---------------------------------------------------------------------- Cervix Uterus Adnexa  Cervix  Length:           2.57  cm.  Measured transvaginally.  Uterus  No abnormality visualized.  Left Ovary  Not visualized.  Right Ovary  Not visualized.  Adnexa  No abnormality visualized. ----------------------------------------------------------------------  Comments  This patient presented to the MAU due to lower abdominal  cramping. She has a prior full term birth.  On a transvaginal ultrasound performed today, her cervical  length measured 2.57 cm long without any signs of funneling.  There was a nabothian cyst noted in the cervix at the level of  the internal os.  The patient should have a follow up cervical length scheduled  in one week to determine if any further treatment is  necessary. ----------------------------------------------------------------------                   Ma RingsVictor Fang, MD Electronically Signed Final Report   08/29/2019 05:56 am ----------------------------------------------------------------------  US MFM OB COMP + 14 WK  Result Date: 08/16/2019 ----------------------------------------------------------------------  OBSTETRICS REPORT                        (Signed Final 08/16/2019 10:09 am) ---------------------------------------------------------------------- Patient Info  ID #:       [id number]                          D.O.B.:  [birth year] (23 yrs)  Name:       Brandi Henson                 Visit Date: 08/15/2019 10:49 am ---------------------------------------------------------------------- Performed By  Performed By:     Percell BostonHeather Waken  Ref. Address:      [address]                                                              [address]                                                              [place]                                                              [address]  Attending:        Lin Landsman      [facility]                                        [facility]  Referred By:      [facility] ---------------------------------------------------------------------- Orders   #  Description                          Code         Ordered By   1  [place] MFM OB COMP + 14 WK                76805.01     LISA LEFTWICH-                                                        KIRBY  ----------------------------------------------------------------------   #  Order #                    Accession #                 Episode #   1  [id number]                  [id number]                  [id number]  ----------------------------------------------------------------------  Indications   [redacted] weeks gestation of pregnancy                Z3A.18   Encounter for antenatal screening for          Z36.3   malformations (low risk NIPS, 7.8FF, neg   AFP)  ---------------------------------------------------------------------- Fetal Evaluation  Num Of Fetuses:          1  Cardiac Activity:        Observed  Presentation:            Cephalic  Placenta:                Anterior  P. Cord Insertion:       Visualized, central  Amniotic Fluid  AFI FV:      Within normal limits                              Largest Pocket(cm)                              5.58 ---------------------------------------------------------------------- Biometry  BPD:      44.3  mm     G. Age:  19w 3d         79  %    CI:          77.5  %    70 - 86                                                          FL/HC:       18.6  %    16.1 - 18.3  HC:      159.3  mm     G. Age:  18w 5d         45  %    HC/AC:       1.09       1.09 - 1.39  AC:      146.6  mm     G. Age:  20w 0d         84  %    FL/BPD:      66.8  %  FL:       29.6  mm     G. Age:  19w 1d         59  %    FL/AC:       20.2  %    20 - 24  HUM:      30.2  mm     G. Age:  20w 0d         84  %  CER:      19.4  mm     G. Age:  18w 5d         50  %  CM:          4  mm  Est. FW:     295   gm   0 lb 10 oz      87  % ---------------------------------------------------------------------- OB History  Gravidity:    2         Term:   1        Prem:   0  SAB:   0  TOP:          0       Ectopic:  0        Living: 1 ---------------------------------------------------------------------- Gestational Age  LMP:            18w 5d        Date:  04/06/19                 EDD:   01/11/20  U/S Today:     19w 2d                                        EDD:   01/07/20  Best:          18w 5d     Det. By:  LMP  (04/06/19)          EDD:   01/11/20 ---------------------------------------------------------------------- Anatomy  Cranium:               Appears normal         LVOT:                   Appears normal  Cavum:                 Appears normal         Aortic Arch:            Not well visualized  Ventricles:            Appears normal         Ductal Arch:            Appears normal  Choroid Plexus:        Appears normal         Diaphragm:              Appears normal  Cerebellum:            Appears normal         Stomach:                Appears normal, left                                                                        sided  Posterior Fossa:       Appears normal         Abdomen:                Appears normal  Nuchal Fold:           Appears normal         Abdominal Wall:         Appears nml (cord                                                                        insert, abd wall)  Face:  Appears normal         Cord Vessels:           Appears normal (3                         (orbits and profile)                           vessel cord)  Lips:                  Appears normal         Kidneys:                Appear normal  Palate:                Appears normal         Bladder:                Appears normal  Thoracic:              Appears normal         Spine:                  Not well visualized  Heart:                 Appears normal         Upper Extremities:      Appears normal                         (4CH, axis, and                         situs)  RVOT:                  Appears normal         Lower Extremities:      Appears normal  Other:  Fetus appears to be a female. Heels visualized. Nasal bone visualized.          Technically difficult due to fetal position.  ---------------------------------------------------------------------- Cervix Uterus Adnexa  Cervix  Length:            3.1  cm.  Normal appearance by transabdominal scan.  Uterus  No abnormality visualized.  Left Ovary  No adnexal mass visualized.  Right Ovary  No adnexal mass visualized.  Cul De Sac  No free fluid seen.  Adnexa  No abnormality visualized. ---------------------------------------------------------------------- Impression  Normal anatomy however, due to fetal position suboptimal  views of the fetal anatomy was obtained.  Normal fetal movement and amniotic fluid ---------------------------------------------------------------------- Recommendations  Follow up growth in 4 weeks. ----------------------------------------------------------------------               Lin Landsman, MD Electronically Signed Final Report   08/16/2019 10:09 am ----------------------------------------------------------------------   Assessment and Plan:  Pregnancy: G2P1001 at [redacted]w[redacted]d 1. Supervision of other normal pregnancy, antepartum -Anticipatory guidance for upcoming appts. -Informed that will follow up in 2 weeks for review of [place].  Okay for virtual visit. -Instructed to monitor headaches and report any increases. -Reviewed cold medications that are safe for pregnancy.  2. Short cervix affecting pregnancy -Patient instructed to initiate pelvic rest until next [place] for cervical length. -Discussed continuation of vaginal progesterone. -Reviewed possibility of rescue cerclage if patient continues to have shortening of cervix. However, informed that MFM would make  this determination after completion of [place].   3. Pelvic pressure in pregnancy, antepartum, second trimester -Encouraged rest and continued usage of pregnancy support band/belt. -Instructed to report to MAU for worsening pelvic pain with onset of cramping and/or contractions.   Preterm labor symptoms and general obstetric precautions including but not  limited to vaginal bleeding, contractions, leaking of fluid and fetal movement were reviewed in detail with the patient. I discussed the assessment and treatment plan with the patient. The patient was provided an opportunity to ask questions and all were answered. The patient agreed with the plan and demonstrated an understanding of the instructions. The patient was advised to call back or seek an in-person office evaluation/go to MAU at [facility] for any urgent or concerning symptoms. Please refer to After Visit Summary for other counseling recommendations.   I provided 10 minutes of face-to-face time during this encounter.  No follow-ups on file.  Future Appointments  Date Time Provider Department Center  09/18/2019  3:30 PM [facility] NURSE [facility]  09/18/2019  3:30 PM [facility] [place] 1 WH-M[facility]US [facility]-US    Cherre Robins, CNM [facility]

## 2019-09-12 ENCOUNTER — Ambulatory Visit (HOSPITAL_COMMUNITY): Payer: Medicaid Other

## 2019-09-12 ENCOUNTER — Encounter (HOSPITAL_COMMUNITY): Payer: Self-pay

## 2019-09-12 LAB — NOVEL CORONAVIRUS, NAA: SARS-CoV-2, NAA: NOT DETECTED

## 2019-09-18 ENCOUNTER — Encounter: Payer: Self-pay | Admitting: Advanced Practice Midwife

## 2019-09-18 ENCOUNTER — Ambulatory Visit (HOSPITAL_COMMUNITY)
Admission: RE | Admit: 2019-09-18 | Discharge: 2019-09-18 | Disposition: A | Discharge status: Home / Self Care | Payer: Medicaid Other | Source: Ambulatory Visit | Attending: Obstetrics and Gynecology | Admitting: Obstetrics and Gynecology

## 2019-09-18 ENCOUNTER — Ambulatory Visit (HOSPITAL_COMMUNITY): Payer: Medicaid Other | Admitting: *Deleted

## 2019-09-18 ENCOUNTER — Encounter (HOSPITAL_COMMUNITY): Payer: Self-pay | Admitting: *Deleted

## 2019-09-18 ENCOUNTER — Other Ambulatory Visit: Payer: Self-pay

## 2019-09-18 DIAGNOSIS — Z3480 Encounter for supervision of other normal pregnancy, unspecified trimester: Secondary | ICD-10-CM | POA: Insufficient documentation

## 2019-09-18 DIAGNOSIS — O26879 Cervical shortening, unspecified trimester: Secondary | ICD-10-CM

## 2019-09-18 DIAGNOSIS — Z3A23 23 weeks gestation of pregnancy: Secondary | ICD-10-CM

## 2019-09-18 DIAGNOSIS — O26872 Cervical shortening, second trimester: Secondary | ICD-10-CM

## 2019-09-18 DIAGNOSIS — Z362 Encounter for other antenatal screening follow-up: Secondary | ICD-10-CM | POA: Diagnosis not present

## 2019-09-25 ENCOUNTER — Encounter: Payer: Self-pay | Admitting: Women's Health

## 2019-09-25 ENCOUNTER — Telehealth (INDEPENDENT_AMBULATORY_CARE_PROVIDER_SITE_OTHER): Payer: Medicaid Other | Admitting: Women's Health

## 2019-09-25 DIAGNOSIS — Z3480 Encounter for supervision of other normal pregnancy, unspecified trimester: Secondary | ICD-10-CM

## 2019-09-25 DIAGNOSIS — O26872 Cervical shortening, second trimester: Secondary | ICD-10-CM | POA: Diagnosis not present

## 2019-09-25 DIAGNOSIS — O26879 Cervical shortening, unspecified trimester: Secondary | ICD-10-CM

## 2019-09-25 DIAGNOSIS — O2612 Low weight gain in pregnancy, second trimester: Secondary | ICD-10-CM

## 2019-09-25 DIAGNOSIS — Z3A24 24 weeks gestation of pregnancy: Secondary | ICD-10-CM

## 2019-09-25 DIAGNOSIS — O2611 Low weight gain in pregnancy, first trimester: Secondary | ICD-10-CM

## 2019-09-25 NOTE — Patient Instructions (Addendum)
Maternity Assessment Unit (MAU)  The Maternity Assessment Unit (MAU) is located at the [facility]. The address is: [address], [place], [place], [place] [address]. Please see map below for additional directions.    The Maternity Assessment Unit is designed to help you during your pregnancy, and for up to 6 weeks after delivery, with any pregnancy- or postpartum-related emergencies, if you think you are in labor, or if your water has broken. For example, if you experience nausea and vomiting, vaginal bleeding, severe abdominal or pelvic pain, elevated blood pressure or other problems related to your pregnancy or postpartum time, please come to the Maternity Assessment Unit for assistance.  Glucose Tolerance Test During Pregnancy Why am I having this test? The glucose tolerance test (GTT) is done to check how your body processes sugar (glucose). This is one of several tests used to diagnose diabetes that develops during pregnancy (gestational diabetes mellitus). Gestational diabetes is a temporary form of diabetes that some women develop during pregnancy. It usually occurs during the second trimester of pregnancy and goes away after delivery. Testing (screening) for gestational diabetes usually occurs between 24 and 28 weeks of pregnancy. You may have the GTT test after having a 1-hour glucose screening test if the results from that test indicate that you may have gestational diabetes. You may also have this test if:  You have a history of gestational diabetes.  You have a history of giving birth to very large babies or have experienced repeated fetal loss (stillbirth).  You have signs and symptoms of diabetes, such as: ? Changes in your vision. ? Tingling or numbness in your hands or feet. ? Changes in hunger, thirst, and urination that are not otherwise explained by your pregnancy. What is being tested? This test measures the amount of  glucose in your blood at different times during a period of 3 hours. This indicates how well your body is able to process glucose. What kind of sample is taken?  Blood samples are required for this test. They are usually collected by inserting a needle into a blood vessel. How do I prepare for this test?  For 3 days before your test, eat normally. Have plenty of carbohydrate-rich foods.  Follow instructions from your health care provider about: ? Eating or drinking restrictions on the day of the test. You may be asked to not eat or drink anything other than water (fast) starting 8-10 hours before the test. ? Changing or stopping your regular medicines. Some medicines may interfere with this test. Tell a health care provider about:  All medicines you are taking, including vitamins, herbs, eye drops, creams, and over-the-counter medicines.  Any blood disorders you have.  Any surgeries you have had.  Any medical conditions you have. What happens during the test? First, your blood glucose will be measured. This is referred to as your fasting blood glucose, since you fasted before the test. Then, you will drink a glucose solution that contains a certain amount of glucose. Your blood glucose will be measured again 1, 2, and 3 hours after drinking the solution. This test takes about 3 hours to complete. You will need to stay at the testing location during this time. During the testing period:  Do not eat or drink anything other than the glucose solution.  Do not exercise.  Do not use any products that contain nicotine or tobacco, such as cigarettes and e-cigarettes. If you need help stopping, ask your  health care provider. The testing procedure may vary among health care providers and hospitals. How are the results reported? Your results will be reported as milligrams of glucose per deciliter of blood (mg/dL) or millimoles per liter (mmol/L). Your health care provider will compare your  results to normal ranges that were established after testing a large group of people (reference ranges). Reference ranges may vary among labs and hospitals. For this test, common reference ranges are:  Fasting: less than 95-105 mg/dL (1.6-1.0 mmol/L).  1 hour after drinking glucose: less than 180-190 mg/dL (96.0-45.4 mmol/L).  2 hours after drinking glucose: less than 155-165 mg/dL (0.9-8.1 mmol/L).  3 hours after drinking glucose: 140-145 mg/dL (1.9-1.4 mmol/L). What do the results mean? Results within reference ranges are considered normal, meaning that your glucose levels are well-controlled. If two or more of your blood glucose levels are high, you may be diagnosed with gestational diabetes. If only one level is high, your health care provider may suggest repeat testing or other tests to confirm a diagnosis. Talk with your health care provider about what your results mean. Questions to ask your health care provider Ask your health care provider, or the department that is doing the test:  When will my results be ready?  How will I get my results?  What are my treatment options?  What other tests do I need?  What are my next steps? Summary  The glucose tolerance test (GTT) is one of several tests used to diagnose diabetes that develops during pregnancy (gestational diabetes mellitus). Gestational diabetes is a temporary form of diabetes that some women develop during pregnancy.  You may have the GTT test after having a 1-hour glucose screening test if the results from that test indicate that you may have gestational diabetes. You may also have this test if you have any symptoms or risk factors for gestational diabetes.  Talk with your health care provider about what your results mean. This information is not intended to replace advice given to you by your health care provider. Make sure you discuss any questions you have with your health care provider. Document Revised: 11/03/2018  Document Reviewed: 02/22/2017 Elsevier Patient Education  2020 [employer].  Preterm Labor and Birth Information  The normal length of a pregnancy is 39-41 weeks. Preterm labor is when labor starts before 37 completed weeks of pregnancy. What are the risk factors for preterm labor? Preterm labor is more likely to occur in women who:  Have certain infections during pregnancy such as a bladder infection, sexually transmitted infection, or infection inside the uterus (chorioamnionitis).  Have a shorter-than-normal cervix.  Have gone into preterm labor before.  Have had surgery on their cervix.  Are younger than age 75 or older than age 49.  Are African American.  Are pregnant with twins or multiple babies (multiple gestation).  Take street drugs or smoke while pregnant.  Do not gain enough weight while pregnant.  Became pregnant shortly after having been pregnant. What are the symptoms of preterm labor? Symptoms of preterm labor include:  Cramps similar to those that can happen during a menstrual period. The cramps may happen with diarrhea.  Pain in the abdomen or lower back.  Regular uterine contractions that may feel like tightening of the abdomen.  A feeling of increased pressure in the pelvis.  Increased watery or bloody mucus discharge from the vagina.  Water breaking (ruptured amniotic sac). Why is it important to recognize signs of preterm labor? It is important to  recognize signs of preterm labor because babies who are born prematurely may not be fully developed. This can put them at an increased risk for:  Long-term (chronic) heart and lung problems.  Difficulty immediately after birth with regulating body systems, including blood sugar, body temperature, heart rate, and breathing rate.  Bleeding in the brain.  Cerebral palsy.  Learning difficulties.  Death. These risks are highest for babies who are born before 34 weeks of pregnancy. How is preterm  labor treated? Treatment depends on the length of your pregnancy, your condition, and the health of your baby. It may involve:  Having a stitch (suture) placed in your cervix to prevent your cervix from opening too early (cerclage).  Taking or being given medicines, such as: ? Hormone medicines. These may be given early in pregnancy to help support the pregnancy. ? Medicine to stop contractions. ? Medicines to help mature the baby's lungs. These may be prescribed if the risk of delivery is high. ? Medicines to prevent your baby from developing cerebral palsy. If the labor happens before 34 weeks of pregnancy, you may need to stay in the hospital. What should I do if I think I am in preterm labor? If you think that you are going into preterm labor, call your health care provider right away. How can I prevent preterm labor in future pregnancies? To increase your chance of having a full-term pregnancy:  Do not use any tobacco products, such as cigarettes, chewing tobacco, and e-cigarettes. If you need help quitting, ask your health care provider.  Do not use street drugs or medicines that have not been prescribed to you during your pregnancy.  Talk with your health care provider before taking any herbal supplements, even if you have been taking them regularly.  Make sure you gain a healthy amount of weight during your pregnancy.  Watch for infection. If you think that you might have an infection, get it checked right away.  Make sure to tell your health care provider if you have gone into preterm labor before. This information is not intended to replace advice given to you by your health care provider. Make sure you discuss any questions you have with your health care provider. Document Revised: 11/04/2018 Document Reviewed: 12/04/2015 Elsevier Patient Education  2020 [employer].  Third Trimester of Pregnancy The third trimester is from week 28 through week 40 (months 7 through 9).  The third trimester is a time when the unborn baby (fetus) is growing rapidly. At the end of the ninth month, the fetus is about 20 inches in length and weighs 6-10 pounds. Body changes during your third trimester Your body will continue to go through many changes during pregnancy. The changes vary from woman to woman. During the third trimester:  Your weight will continue to increase. You can expect to gain 25-35 pounds (11-16 kg) by the end of the pregnancy.  You may begin to get stretch marks on your hips, abdomen, and breasts.  You may urinate more often because the fetus is moving lower into your pelvis and pressing on your bladder.  You may develop or continue to have heartburn. This is caused by increased hormones that slow down muscles in the digestive tract.  You may develop or continue to have constipation because increased hormones slow digestion and cause the muscles that push waste through your intestines to relax.  You may develop hemorrhoids. These are swollen veins (varicose veins) in the rectum that can itch or be painful.  You may develop swollen, bulging veins (varicose veins) in your legs.  You may have increased body aches in the pelvis, back, or thighs. This is due to weight gain and increased hormones that are relaxing your joints.  You may have changes in your hair. These can include thickening of your hair, rapid growth, and changes in texture. Some women also have hair loss during or after pregnancy, or hair that feels dry or thin. Your hair will most likely return to normal after your baby is born.  Your breasts will continue to grow and they will continue to become tender. A yellow fluid (colostrum) may leak from your breasts. This is the first milk you are producing for your baby.  Your belly button may stick out.  You may notice more swelling in your hands, face, or ankles.  You may have increased tingling or numbness in your hands, arms, and legs. The skin  on your belly may also feel numb.  You may feel short of breath because of your expanding uterus.  You may have more problems sleeping. This can be caused by the size of your belly, increased need to urinate, and an increase in your body's metabolism.  You may notice the fetus "dropping," or moving lower in your abdomen (lightening).  You may have increased vaginal discharge.  You may notice your joints feel loose and you may have pain around your pelvic bone. What to expect at prenatal visits You will have prenatal exams every 2 weeks until week 36. Then you will have weekly prenatal exams. During a routine prenatal visit:  You will be weighed to make sure you and the baby are growing normally.  Your blood pressure will be taken.  Your abdomen will be measured to track your baby's growth.  The fetal heartbeat will be listened to.  Any test results from the previous visit will be discussed.  You may have a cervical check near your due date to see if your cervix has softened or thinned (effaced).  You will be tested for Group B streptococcus. This happens between 35 and 37 weeks. Your health care provider may ask you:  What your birth plan is.  How you are feeling.  If you are feeling the baby move.  If you have had any abnormal symptoms, such as leaking fluid, bleeding, severe headaches, or abdominal cramping.  If you are using any tobacco products, including cigarettes, chewing tobacco, and electronic cigarettes.  If you have any questions. Other tests or screenings that may be performed during your third trimester include:  Blood tests that check for low iron levels (anemia).  Fetal testing to check the health, activity level, and growth of the fetus. Testing is done if you have certain medical conditions or if there are problems during the pregnancy.  Nonstress test (NST). This test checks the health of your baby to make sure there are no signs of problems, such as  the baby not getting enough oxygen. During this test, a belt is placed around your belly. The baby is made to move, and its heart rate is monitored during movement. What is false labor? False labor is a condition in which you feel small, irregular tightenings of the muscles in the womb (contractions) that usually go away with rest, changing position, or drinking water. These are called Braxton Hicks contractions. Contractions may last for hours, days, or even weeks before true labor sets in. If contractions come at regular intervals, become more frequent, increase in  intensity, or become painful, you should see your health care provider. What are the signs of labor?  Abdominal cramps.  Regular contractions that start at 10 minutes apart and become stronger and more frequent with time.  Contractions that start on the top of the uterus and spread down to the lower abdomen and back.  Increased pelvic pressure and dull back pain.  A watery or bloody mucus discharge that comes from the vagina.  Leaking of amniotic fluid. This is also known as your "water breaking." It could be a slow trickle or a gush. Let your health care provider know if it has a color or strange odor. If you have any of these signs, call your health care provider right away, even if it is before your due date. Follow these instructions at home: Medicines  Follow your health care provider's instructions regarding medicine use. Specific medicines may be either safe or unsafe to take during pregnancy.  Take a prenatal vitamin that contains at least 600 micrograms (mcg) of folic acid.  If you develop constipation, try taking a stool softener if your health care provider approves. Eating and drinking   Eat a balanced diet that includes fresh fruits and vegetables, whole grains, good sources of protein such as meat, eggs, or tofu, and low-fat dairy. Your health care provider will help you determine the amount of weight gain that  is right for you.  Avoid raw meat and uncooked cheese. These carry germs that can cause birth defects in the baby.  If you have low calcium intake from food, talk to your health care provider about whether you should take a daily calcium supplement.  Eat four or five small meals rather than three large meals a day.  Limit foods that are high in fat and processed sugars, such as fried and sweet foods.  To prevent constipation: ? Drink enough fluid to keep your urine clear or pale yellow. ? Eat foods that are high in fiber, such as fresh fruits and vegetables, whole grains, and beans. Activity  Exercise only as directed by your health care provider. Most women can continue their usual exercise routine during pregnancy. Try to exercise for 30 minutes at least 5 days a week. Stop exercising if you experience uterine contractions.  Avoid heavy lifting.  Do not exercise in extreme heat or humidity, or at high altitudes.  Wear low-heel, comfortable shoes.  Practice good posture.  You may continue to have sex unless your health care provider tells you otherwise. Relieving pain and discomfort  Take frequent breaks and rest with your legs elevated if you have leg cramps or low back pain.  Take warm sitz baths to soothe any pain or discomfort caused by hemorrhoids. Use hemorrhoid cream if your health care provider approves.  Wear a good support bra to prevent discomfort from breast tenderness.  If you develop varicose veins: ? Wear support pantyhose or compression stockings as told by your healthcare provider. ? Elevate your feet for 15 minutes, 3-4 times a day. Prenatal care  Write down your questions. Take them to your prenatal visits.  Keep all your prenatal visits as told by your health care provider. This is important. Safety  Wear your seat belt at all times when driving.  Make a list of emergency phone numbers, including numbers for family, friends, the hospital, and police  and fire departments. General instructions  Avoid cat litter boxes and soil used by cats. These carry germs that can cause birth defects in  the baby. If you have a cat, ask someone to clean the litter box for you.  Do not travel far distances unless it is absolutely necessary and only with the approval of your health care provider.  Do not use hot tubs, steam rooms, or saunas.  Do not drink alcohol.  Do not use any products that contain nicotine or tobacco, such as cigarettes and e-cigarettes. If you need help quitting, ask your health care provider.  Do not use any medicinal herbs or unprescribed drugs. These chemicals affect the formation and growth of the baby.  Do not douche or use tampons or scented sanitary pads.  Do not cross your legs for long periods of time.  To prepare for the arrival of your baby: ? Take prenatal classes to understand, practice, and ask questions about labor and delivery. ? Make a trial run to the hospital. ? Visit the hospital and tour the maternity area. ? Arrange for maternity or paternity leave through employers. ? Arrange for family and friends to take care of pets while you are in the hospital. ? Purchase a rear-facing car seat and make sure you know how to install it in your car. ? Pack your hospital bag. ? Prepare the baby's nursery. Make sure to remove all pillows and stuffed animals from the baby's crib to prevent suffocation.  Visit your dentist if you have not gone during your pregnancy. Use a soft toothbrush to brush your teeth and be gentle when you floss. Contact a health care provider if:  You are unsure if you are in labor or if your water has broken.  You become dizzy.  You have mild pelvic cramps, pelvic pressure, or nagging pain in your abdominal area.  You have lower back pain.  You have persistent nausea, vomiting, or diarrhea.  You have an unusual or bad smelling vaginal discharge.  You have pain when you urinate. Get  help right away if:  Your water breaks before 37 weeks.  You have regular contractions less than 5 minutes apart before 37 weeks.  You have a fever.  You are leaking fluid from your vagina.  You have spotting or bleeding from your vagina.  You have severe abdominal pain or cramping.  You have rapid weight loss or weight gain.  You have shortness of breath with chest pain.  You notice sudden or extreme swelling of your face, hands, ankles, feet, or legs.  Your baby makes fewer than 10 movements in 2 hours.  You have severe headaches that do not go away when you take medicine.  You have vision changes. Summary  The third trimester is from week 28 through week 40, months 7 through 9. The third trimester is a time when the unborn baby (fetus) is growing rapidly.  During the third trimester, your discomfort may increase as you and your baby continue to gain weight. You may have abdominal, leg, and back pain, sleeping problems, and an increased need to urinate.  During the third trimester your breasts will keep growing and they will continue to become tender. A yellow fluid (colostrum) may leak from your breasts. This is the first milk you are producing for your baby.  False labor is a condition in which you feel small, irregular tightenings of the muscles in the womb (contractions) that eventually go away. These are called Braxton Hicks contractions. Contractions may last for hours, days, or even weeks before true labor sets in.  Signs of labor can include: abdominal cramps; regular  contractions that start at 10 minutes apart and become stronger and more frequent with time; watery or bloody mucus discharge that comes from the vagina; increased pelvic pressure and dull back pain; and leaking of amniotic fluid. This information is not intended to replace advice given to you by your health care provider. Make sure you discuss any questions you have with your health care  provider. Document Revised: 11/03/2018 Document Reviewed: 08/18/2016 Elsevier Patient Education  2020 [employer]  Second Trimester of Pregnancy The second trimester is from week 14 through week 27 (months 4 through 6). The second trimester is often a time when you feel your best. Your body has adjusted to being pregnant, and you begin to feel better physically. Usually, morning sickness has lessened or quit completely, you may have more energy, and you may have an increase in appetite. The second trimester is also a time when the fetus is growing rapidly. At the end of the sixth month, the fetus is about 9 inches long and weighs about 1 pounds. You will likely begin to feel the baby move (quickening) between 16 and 20 weeks of pregnancy. Body changes during your second trimester Your body continues to go through many changes during your second trimester. The changes vary from woman to woman.  Your weight will continue to increase. You will notice your lower abdomen bulging out.  You may begin to get stretch marks on your hips, abdomen, and breasts.  You may develop headaches that can be relieved by medicines. The medicines should be approved by your health care provider.  You may urinate more often because the fetus is pressing on your bladder.  You may develop or continue to have heartburn as a result of your pregnancy.  You may develop constipation because certain hormones are causing the muscles that push waste through your intestines to slow down.  You may develop hemorrhoids or swollen, bulging veins (varicose veins).  You may have back pain. This is caused by: ? Weight gain. ? Pregnancy hormones that are relaxing the joints in your pelvis. ? A shift in weight and the muscles that support your balance.  Your breasts will continue to grow and they will continue to become tender.  Your gums may bleed and may be sensitive to brushing and flossing.  Dark spots or blotches  (chloasma, mask of pregnancy) may develop on your face. This will likely fade after the baby is born.  A dark line from your belly button to the pubic area (linea nigra) may appear. This will likely fade after the baby is born.  You may have changes in your hair. These can include thickening of your hair, rapid growth, and changes in texture. Some women also have hair loss during or after pregnancy, or hair that feels dry or thin. Your hair will most likely return to normal after your baby is born. What to expect at prenatal visits During a routine prenatal visit:  You will be weighed to make sure you and the fetus are growing normally.  Your blood pressure will be taken.  Your abdomen will be measured to track your baby's growth.  The fetal heartbeat will be listened to.  Any test results from the previous visit will be discussed. Your health care provider may ask you:  How you are feeling.  If you are feeling the baby move.  If you have had any abnormal symptoms, such as leaking fluid, bleeding, severe headaches, or abdominal cramping.  If you are using  any tobacco products, including cigarettes, chewing tobacco, and electronic cigarettes.  If you have any questions. Other tests that may be performed during your second trimester include:  Blood tests that check for: ? Low iron levels (anemia). ? High blood sugar that affects pregnant women (gestational diabetes) between 61 and 28 weeks. ? Rh antibodies. This is to check for a protein on red blood cells (Rh factor).  Urine tests to check for infections, diabetes, or protein in the urine.  An ultrasound to confirm the proper growth and development of the baby.  An amniocentesis to check for possible genetic problems.  Fetal screens for spina bifida and Down syndrome.  HIV (human immunodeficiency virus) testing. Routine prenatal testing includes screening for HIV, unless you choose not to have this test. Follow these  instructions at home: Medicines  Follow your health care provider's instructions regarding medicine use. Specific medicines may be either safe or unsafe to take during pregnancy.  Take a prenatal vitamin that contains at least 600 micrograms (mcg) of folic acid.  If you develop constipation, try taking a stool softener if your health care provider approves. Eating and drinking   Eat a balanced diet that includes fresh fruits and vegetables, whole grains, good sources of protein such as meat, eggs, or tofu, and low-fat dairy. Your health care provider will help you determine the amount of weight gain that is right for you.  Avoid raw meat and uncooked cheese. These carry germs that can cause birth defects in the baby.  If you have low calcium intake from food, talk to your health care provider about whether you should take a daily calcium supplement.  Limit foods that are high in fat and processed sugars, such as fried and sweet foods.  To prevent constipation: ? Drink enough fluid to keep your urine clear or pale yellow. ? Eat foods that are high in fiber, such as fresh fruits and vegetables, whole grains, and beans. Activity  Exercise only as directed by your health care provider. Most women can continue their usual exercise routine during pregnancy. Try to exercise for 30 minutes at least 5 days a week. Stop exercising if you experience uterine contractions.  Avoid heavy lifting, wear low heel shoes, and practice good posture.  A sexual relationship may be continued unless your health care provider directs you otherwise. Relieving pain and discomfort  Wear a good support bra to prevent discomfort from breast tenderness.  Take warm sitz baths to soothe any pain or discomfort caused by hemorrhoids. Use hemorrhoid cream if your health care provider approves.  Rest with your legs elevated if you have leg cramps or low back pain.  If you develop varicose veins, wear support hose.  Elevate your feet for 15 minutes, 3-4 times a day. Limit salt in your diet. Prenatal Care  Write down your questions. Take them to your prenatal visits.  Keep all your prenatal visits as told by your health care provider. This is important. Safety  Wear your seat belt at all times when driving.  Make a list of emergency phone numbers, including numbers for family, friends, the hospital, and police and fire departments. General instructions  Ask your health care provider for a referral to a local prenatal education class. Begin classes no later than the beginning of month 6 of your pregnancy.  Ask for help if you have counseling or nutritional needs during pregnancy. Your health care provider can offer advice or refer you to specialists for help with various  needs.  Do not use hot tubs, steam rooms, or saunas.  Do not douche or use tampons or scented sanitary pads.  Do not cross your legs for long periods of time.  Avoid cat litter boxes and soil used by cats. These carry germs that can cause birth defects in the baby and possibly loss of the fetus by miscarriage or stillbirth.  Avoid all smoking, herbs, alcohol, and unprescribed drugs. Chemicals in these products can affect the formation and growth of the baby.  Do not use any products that contain nicotine or tobacco, such as cigarettes and e-cigarettes. If you need help quitting, ask your health care provider.  Visit your dentist if you have not gone yet during your pregnancy. Use a soft toothbrush to brush your teeth and be gentle when you floss. Contact a health care provider if:  You have dizziness.  You have mild pelvic cramps, pelvic pressure, or nagging pain in the abdominal area.  You have persistent nausea, vomiting, or diarrhea.  You have a bad smelling vaginal discharge.  You have pain when you urinate. Get help right away if:  You have a fever.  You are leaking fluid from your vagina.  You have spotting or  bleeding from your vagina.  You have severe abdominal cramping or pain.  You have rapid weight gain or weight loss.  You have shortness of breath with chest pain.  You notice sudden or extreme swelling of your face, hands, ankles, feet, or legs.  You have not felt your baby move in over an hour.  You have severe headaches that do not go away when you take medicine.  You have vision changes. Summary  The second trimester is from week 14 through week 27 (months 4 through 6). It is also a time when the fetus is growing rapidly.  Your body goes through many changes during pregnancy. The changes vary from woman to woman.  Avoid all smoking, herbs, alcohol, and unprescribed drugs. These chemicals affect the formation and growth your baby.  Do not use any tobacco products, such as cigarettes, chewing tobacco, and e-cigarettes. If you need help quitting, ask your health care provider.  Contact your health care provider if you have any questions. Keep all prenatal visits as told by your health care provider. This is important. This information is not intended to replace advice given to you by your health care provider. Make sure you discuss any questions you have with your health care provider. Document Revised: 11/04/2018 Document Reviewed: 08/18/2016 Elsevier Patient Education  2020 [employer]vier Inc.   Cervical Insufficiency Cervical insufficiency is when the cervix is weak and starts to open (dilate) and thin (efface) before the pregnancy is at term and before labor starts. This is also called incompetent cervix. It can happen during the second or third trimester when the fetus starts putting pressure on the cervix. Treatment may reduce the risk of problems for you and your baby. Cervical insufficiency can lead to:  Loss of the baby (miscarriage).  Breaking of the sac that holds the baby (amniotic sac). This is also called preterm premature rupture of the membranes,PPROM.  The baby being  born early (preterm birth). What are the causes? The cause of this condition is not well known. However, it may be caused by abnormalities in the cervix and other factors such as inflammation or infection. What increases the risk? This condition is more likely to develop if:  You have a shorter cervix than normal.  Your cervix  was damaged or injured during a past pregnancy or surgery.  You were born with a cervical defect.  You have had a procedure done on the cervix, such as cervical biopsy.  You have a history of: ? Cervical insufficiency. ? PPROM.  You have ended several pregnancies through abortion.  You were exposed to the drug diethylstilbestrol (DES). What are the signs or symptoms? Symptoms of this condition can vary. Sometimes there no symptoms for this condition, and at other times there are mild symptoms that start between weeks 14 and 20 of pregnancy. The symptoms may last several days or weeks. These symptoms include:  Light spotting or bleeding from the vagina.  Pelvic pressure.  A change in vaginal discharge, such as changes from clear, white, or light yellow to pink or tan.  Back pain.  Abdominal pain or cramping. How is this diagnosed? This condition may be diagnosed based on:  Your symptoms.  Your medical history, including: ? Any problems during past pregnancies, such as miscarriages. ? Any procedures performed on your cervix. ? Any history of cervical insufficiency. During the second trimester, cervical insufficiency may be diagnosed based on:  An ultrasound done with a probe inserted into your vagina (transvaginal ultrasound).  A pelvic exam.  Tests of fluid in the amniotic sac. This is done to rule out infection. How is this treated? This condition may be managed by:  Limiting physical activity.  Limiting activity at home or in the hospital.  Pelvic rest. This means that there should be no sexual intercourse or placing anything in the  vagina.  A procedure to sew the cervix closed and prevent it from opening too early (cerclage). The stitches (sutures) are removed between weeks 36 and 38 to avoid problems during labor. Cerclage may be recommended if: ? You have a history of miscarriages or preterm births without a known cause. ? You have a short cervix. A short cervix is identified by ultrasound. ? Your cervix has dilated before 24 weeks of pregnancy. Follow these instructions at home:  Get plenty of rest and lessen activity as told by your health care provider. Ask your health care provider what activities are safe for you.  If pelvic rest was recommended, you shouldnot have sex, use tampons, douche, or place anything inside your vagina until your health care provider says that this is okay.  Take over-the-counter and prescription medicines only as told by your health care provider.  Keep all follow-up visits and prenatal visits as told by your health care provider. This is important. Get help right away if:  You have vaginal bleeding, even if it is a small amount or even if it is painless.  You have pain in your abdomen or your lower back.  You have a feeling of increased pressure in your pelvis.  You have vaginal discharge that changes from clear, white, or light yellow to pink or tan.  You have a fever.  You have severe nausea or vomiting. Summary  Cervical insufficiency is when the cervix is weak and starts to dilate and efface before the pregnancy is at term and before labor starts.  Symptoms of this condition can vary from no symptoms to mild symptoms that start between weeks 14 and 20 of pregnancy. The symptoms may last several days or weeks.  This condition may be managed by limiting physical activity, having pelvic rest, or having cervical cerclage.  If pelvic rest was recommended, you should not have sex, use tampons, use a douche, or  place anything inside your vagina until your health care provider  says that this is okay. This information is not intended to replace advice given to you by your health care provider. Make sure you discuss any questions you have with your health care provider. Document Revised: 06/25/2017 Document Reviewed: 07/16/2016 Elsevier Patient Education  2020 [employer]

## 2019-09-25 NOTE — Progress Notes (Signed)
I connected with Brandi Henson on 09/25/19 at  8:15 AM EST by: MyChart and verified that I am speaking with the correct person using two identifiers.  Patient is located at home and provider is located at [facility].     The purpose of this virtual visit is to provide medical care while limiting exposure to the novel coronavirus. I discussed the limitations, risks, security and privacy concerns of performing an evaluation and management service by MyChart and the availability of in person appointments. I also discussed with the patient that there may be a patient responsible charge related to this service. By engaging in this virtual visit, you consent to the provision of healthcare.  Additionally, you authorize for your insurance to be billed for the services provided during this visit.  The patient expressed understanding and agreed to proceed.  The following staff members participated in the virtual visit:  Brandi Henson    PRENATAL VISIT NOTE  Subjective:  Brandi Henson is a 24 y.o. G2P1001 at [redacted]w[redacted]d  for phone visit for ongoing prenatal care.  She is currently monitored for the following issues for this low-risk pregnancy and has Supervision of other normal pregnancy, antepartum; Poor weight gain of pregnancy, first trimester; Back pain affecting pregnancy in first trimester; and Short cervix affecting pregnancy on their problem list.  Patient reports no complaints.  Contractions: Not present. Vag. Bleeding: None.  Movement: Present. Denies leaking of fluid.   The following portions of the patient's history were reviewed and updated as appropriate: allergies, current medications, past family history, past medical history, past social history, past surgical history and problem list.   Objective:  There were no vitals filed for this visit.  Fetal Status:     Movement: Present     Assessment and Plan:  Pregnancy: G2P1001 at [redacted]w[redacted]d  1. Supervision of other normal pregnancy,  antepartum -in-person visit in 3 weeks for GTT/28wk labs/Tdap -pt reports is unable to take her BP at home because she does not have a blood pressure cuff. Pt advised to get BP taken at free clinic/pharmacy and call us tomorrow with results, pt agrees.  2. Short cervix affecting pregnancy -Patient instructed to continue pelvic rest until next [place] for cervical length. Pt reports she has not been on pelvic rest. -Discussed continuation of vaginal progesterone. -Encouraged rest and continued usage of pregnancy support band/belt. -Instructed to report to MAU for worsening pelvic pain with onset of cramping and/or contractions.  -CL 2cm without funneling on [place] 09/18/2019, next [place] scheduled 10/16/2019, pt aware, per MFM risk of PTB is low.  3. Poor weight gain of pregnancy, first trimester -TWG -3lbs, pt confirms -offered nutrition consultation, pt accepts  Preterm labor symptoms and general obstetric precautions including but not limited to vaginal bleeding, contractions, leaking of fluid and fetal movement were reviewed in detail with the patient. I discussed the assessment and treatment plan with the patient. The patient was provided an opportunity to ask questions and all were answered. The patient agreed with the plan and demonstrated an understanding of the instructions. The patient was advised to call back or seek an in-person office evaluation/go to MAU at [facility] for any urgent or concerning symptoms.  Return in about 3 weeks (around 10/16/2019) for GTT/28wk labs, needs nutrition consultation ASAP.  Future Appointments  Date Time Provider [place]  10/16/2019  2:45 PM [place] [facility]  10/16/2019  2:45 PM [place] 5 [facility]     Time spent  on virtual visit: 10 minutes  Marylen Ponto, NP

## 2019-10-03 ENCOUNTER — Telehealth: Payer: Self-pay

## 2019-10-03 NOTE — Telephone Encounter (Signed)
Returned call, pt complains of vaginal swelling, discharge and irritation, advised pt that scheduler will call for appt.

## 2019-10-05 ENCOUNTER — Other Ambulatory Visit (HOSPITAL_COMMUNITY)
Admission: RE | Admit: 2019-10-05 | Discharge: 2019-10-05 | Disposition: A | Discharge status: Home / Self Care | Payer: Medicaid Other | Source: Ambulatory Visit | Attending: Obstetrics and Gynecology | Admitting: Obstetrics and Gynecology

## 2019-10-05 ENCOUNTER — Encounter: Payer: Self-pay | Admitting: Obstetrics and Gynecology

## 2019-10-05 ENCOUNTER — Ambulatory Visit (INDEPENDENT_AMBULATORY_CARE_PROVIDER_SITE_OTHER): Payer: Medicaid Other | Admitting: Obstetrics and Gynecology

## 2019-10-05 ENCOUNTER — Other Ambulatory Visit: Payer: Self-pay

## 2019-10-05 VITALS — BP 111/73 | HR 89 | Wt 166.2 lb

## 2019-10-05 DIAGNOSIS — Z3480 Encounter for supervision of other normal pregnancy, unspecified trimester: Secondary | ICD-10-CM

## 2019-10-05 DIAGNOSIS — Z3A26 26 weeks gestation of pregnancy: Secondary | ICD-10-CM

## 2019-10-05 DIAGNOSIS — O26879 Cervical shortening, unspecified trimester: Secondary | ICD-10-CM

## 2019-10-05 NOTE — Progress Notes (Signed)
Pt is here with c/o vaginal itching, discharge, and swelling for about 2 weeks.

## 2019-10-05 NOTE — Progress Notes (Signed)
   PRENATAL VISIT NOTE  Subjective:  Brandi Henson is a 24 y.o. G2P1001 at [redacted]w[redacted]d being seen today for ongoing prenatal care.  She is currently monitored for the following issues for this high-risk pregnancy and has Supervision of other normal pregnancy, antepartum; Poor weight gain of pregnancy, first trimester; Back pain affecting pregnancy in first trimester; and Short cervix affecting pregnancy on their problem list.  Patient reports vaginal pruritis for about 2 weeks.  Contractions: Not present. Vag. Bleeding: None.  Movement: Present. Denies leaking of fluid.   The following portions of the patient's history were reviewed and updated as appropriate: allergies, current medications, past family history, past medical history, past social history, past surgical history and problem list.   Objective:   Vitals:   10/05/19 1319  BP: 111/73  Pulse: 89  Weight: 166 lb 3.2 oz (75.4 kg)    Fetal Status: Fetal Heart Rate (bpm): 145 Fundal Height: 26 cm Movement: Present     General:  Alert, oriented and cooperative. Patient is in no acute distress.  Skin: Skin is warm and dry. No rash noted.   Cardiovascular: Normal heart rate noted  Respiratory: Normal respiratory effort, no problems with respiration noted  Abdomen: Soft, gravid, appropriate for gestational age.  Pain/Pressure: Absent     Pelvic: Cervical exam deferred        Extremities: Normal range of motion.     Mental Status: Normal mood and affect. Normal behavior. Normal judgment and thought content.   Assessment and Plan:  Pregnancy: G2P1001 at [redacted]w[redacted]d 1. Supervision of other normal pregnancy, antepartum - Prominent bilateral labial varicosities. Advised patient to apply warm or cool compresses for relief - vaginal swab collected to rule out infectious etiology - Third trimester labs next visit - Follow up growth 3/22   2. Short cervix affecting pregnancy Continue prometrium  Preterm labor symptoms and general obstetric  precautions including but not limited to vaginal bleeding, contractions, leaking of fluid and fetal movement were reviewed in detail with the patient. Please refer to After Visit Summary for other counseling recommendations.   Return for in person, ROB, 2 hr glucola next visit, High risk.  Future Appointments  Date Time Provider Department Center  10/06/2019  8:00 AM Carolan Shiver, RD [facility] NDM  10/16/2019  8:00 AM [facility] None  10/16/2019  9:00 AM Anyanwu, Jethro Bastos, MD [facility] None  10/16/2019  2:45 PM [facility] NURSE [facility]  10/16/2019  2:45 PM [facility] [place] 5 [facility]    Catalina Antigua, MD

## 2019-10-06 ENCOUNTER — Encounter: Payer: No Typology Code available for payment source | Attending: Women's Health | Admitting: Registered"

## 2019-10-06 ENCOUNTER — Other Ambulatory Visit: Payer: Self-pay

## 2019-10-06 ENCOUNTER — Inpatient Hospital Stay (HOSPITAL_COMMUNITY)
Admission: EM | Admit: 2019-10-06 | Discharge: 2019-10-07 | Disposition: A | Discharge status: Other Acute Inpatient Hospital | Payer: 59 | Attending: Obstetrics and Gynecology | Admitting: Obstetrics and Gynecology

## 2019-10-06 ENCOUNTER — Encounter (HOSPITAL_COMMUNITY): Payer: Self-pay

## 2019-10-06 DIAGNOSIS — Z3A26 26 weeks gestation of pregnancy: Secondary | ICD-10-CM | POA: Diagnosis not present

## 2019-10-06 DIAGNOSIS — R109 Unspecified abdominal pain: Secondary | ICD-10-CM | POA: Diagnosis not present

## 2019-10-06 DIAGNOSIS — Z3689 Encounter for other specified antenatal screening: Secondary | ICD-10-CM

## 2019-10-06 DIAGNOSIS — O9A212 Injury, poisoning and certain other consequences of external causes complicating pregnancy, second trimester: Secondary | ICD-10-CM | POA: Insufficient documentation

## 2019-10-06 DIAGNOSIS — Y9289 Other specified places as the place of occurrence of the external cause: Secondary | ICD-10-CM | POA: Insufficient documentation

## 2019-10-06 DIAGNOSIS — Y990 Civilian activity done for income or pay: Secondary | ICD-10-CM | POA: Diagnosis not present

## 2019-10-06 DIAGNOSIS — O26899 Other specified pregnancy related conditions, unspecified trimester: Secondary | ICD-10-CM

## 2019-10-06 DIAGNOSIS — R1084 Generalized abdominal pain: Secondary | ICD-10-CM | POA: Insufficient documentation

## 2019-10-06 DIAGNOSIS — O26892 Other specified pregnancy related conditions, second trimester: Secondary | ICD-10-CM

## 2019-10-06 DIAGNOSIS — M545 Low back pain: Secondary | ICD-10-CM | POA: Diagnosis not present

## 2019-10-06 DIAGNOSIS — R52 Pain, unspecified: Secondary | ICD-10-CM | POA: Diagnosis not present

## 2019-10-06 DIAGNOSIS — Y9389 Activity, other specified: Secondary | ICD-10-CM | POA: Insufficient documentation

## 2019-10-06 LAB — CERVICOVAGINAL ANCILLARY ONLY
Bacterial Vaginitis (gardnerella): NEGATIVE
Candida Glabrata: NEGATIVE
Candida Vaginitis: POSITIVE — AB
Chlamydia: NEGATIVE
Comment: NEGATIVE
Comment: NEGATIVE
Comment: NEGATIVE
Comment: NEGATIVE
Comment: NEGATIVE
Comment: NORMAL
Neisseria Gonorrhea: NEGATIVE
Trichomonas: NEGATIVE

## 2019-10-06 MED ORDER — CYCLOBENZAPRINE HCL 5 MG PO TABS
10.0000 mg | ORAL_TABLET | Freq: Once | ORAL | Status: AC
  Administered 2019-10-06: 10 mg via ORAL
  Filled 2019-10-06: qty 2
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
## 2019-10-06 NOTE — ED Notes (Signed)
Pt transported to MAU with OB nurse. Pt conscious, breathing, and A&Ox4. No distress noted. All belongings with pt.
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
## 2019-10-07 DIAGNOSIS — O26892 Other specified pregnancy related conditions, second trimester: Secondary | ICD-10-CM | POA: Diagnosis not present

## 2019-10-07 DIAGNOSIS — R109 Unspecified abdominal pain: Secondary | ICD-10-CM | POA: Diagnosis not present

## 2019-10-07 DIAGNOSIS — Z3A26 26 weeks gestation of pregnancy: Secondary | ICD-10-CM

## 2019-10-07 DIAGNOSIS — M545 Low back pain: Secondary | ICD-10-CM | POA: Diagnosis not present

## 2019-10-07 DIAGNOSIS — O9A212 Injury, poisoning and certain other consequences of external causes complicating pregnancy, second trimester: Secondary | ICD-10-CM | POA: Diagnosis not present

## 2019-10-07 MED ORDER — CYCLOBENZAPRINE HCL 10 MG PO TABS: 10.0000 mg | ORAL_TABLET | Freq: Two times a day (BID) | ORAL | 0 refills | Status: DC | PRN

## 2019-10-07 NOTE — Discharge Instructions (Signed)
Abdominal Pain During Pregnancy  Belly (abdominal) pain is common during pregnancy. There are many possible causes. Most of the time, it is not a serious problem. Other times, it can be a sign that something is wrong with the pregnancy. Always tell your doctor if you have belly pain. Follow these instructions at home:  Do not have sex or put anything in your vagina until your pain goes away completely.  Get plenty of rest until your pain gets better.  Drink enough fluid to keep your pee (urine) pale yellow.  Take over-the-counter and prescription medicines only as told by your doctor.  Keep all follow-up visits as told by your doctor. This is important. Contact a doctor if:  Your pain continues or gets worse after resting.  You have lower belly pain that: ? Comes and goes at regular times. ? Spreads to your back. ? Feels like menstrual cramps.  You have pain or burning when you pee (urinate). Get help right away if:  You have a fever or chills.  You have vaginal bleeding.  You are leaking fluid from your vagina.  You are passing tissue from your vagina.  You throw up (vomit) for more than 24 hours.  You have watery poop (diarrhea) for more than 24 hours.  Your baby is moving less than usual.  You feel very weak or faint.  You have shortness of breath.  You have very bad pain in your upper belly. Summary  Belly (abdominal) pain is common during pregnancy. There are many possible causes.  If you have belly pain during pregnancy, tell your doctor right away.  Keep all follow-up visits as told by your doctor. This is important. This information is not intended to replace advice given to you by your health care provider. Make sure you discuss any questions you have with your health care provider. Document Revised: 10/31/2018 Document Reviewed: 10/15/2016 Elsevier Patient Education  2020 Elsevier Inc.  

## 2019-10-09 MED ORDER — TERCONAZOLE 0.8 % VA CREA: 1.0000 | TOPICAL_CREAM | Freq: Every day | VAGINAL | 0 refills | Status: DC

## 2019-10-09 NOTE — Addendum Note (Signed)
Addended by: Catalina Antigua on: 10/09/2019 08:24 AM   Modules accepted: Orders

## 2019-10-11 ENCOUNTER — Telehealth (HOSPITAL_COMMUNITY): Payer: Self-pay | Admitting: *Deleted

## 2019-10-12 ENCOUNTER — Encounter (HOSPITAL_COMMUNITY): Payer: Self-pay | Admitting: Obstetrics & Gynecology

## 2019-10-12 ENCOUNTER — Other Ambulatory Visit: Payer: Self-pay

## 2019-10-12 ENCOUNTER — Inpatient Hospital Stay (HOSPITAL_COMMUNITY)
Admission: AD | Admit: 2019-10-12 | Discharge: 2019-10-12 | Disposition: A | Discharge status: Home / Self Care | Payer: Medicaid Other | Attending: Obstetrics & Gynecology | Admitting: Obstetrics & Gynecology

## 2019-10-12 DIAGNOSIS — Z3480 Encounter for supervision of other normal pregnancy, unspecified trimester: Secondary | ICD-10-CM

## 2019-10-12 DIAGNOSIS — O26872 Cervical shortening, second trimester: Secondary | ICD-10-CM | POA: Diagnosis not present

## 2019-10-12 DIAGNOSIS — O26892 Other specified pregnancy related conditions, second trimester: Secondary | ICD-10-CM | POA: Insufficient documentation

## 2019-10-12 DIAGNOSIS — Z3A27 27 weeks gestation of pregnancy: Secondary | ICD-10-CM | POA: Insufficient documentation

## 2019-10-12 DIAGNOSIS — M545 Low back pain: Secondary | ICD-10-CM | POA: Insufficient documentation

## 2019-10-12 DIAGNOSIS — O99891 Other specified diseases and conditions complicating pregnancy: Secondary | ICD-10-CM | POA: Diagnosis not present

## 2019-10-12 DIAGNOSIS — M549 Dorsalgia, unspecified: Secondary | ICD-10-CM | POA: Diagnosis not present

## 2019-10-12 DIAGNOSIS — O26879 Cervical shortening, unspecified trimester: Secondary | ICD-10-CM

## 2019-10-12 LAB — URINALYSIS, ROUTINE W REFLEX MICROSCOPIC
Bilirubin Urine: NEGATIVE
Glucose, UA: NEGATIVE mg/dL
Hgb urine dipstick: NEGATIVE
Ketones, ur: NEGATIVE mg/dL
Nitrite: NEGATIVE
Protein, ur: 30 mg/dL — AB
Specific Gravity, Urine: 1.027 (ref 1.005–1.030)
pH: 7 (ref 5.0–8.0)

## 2019-10-12 NOTE — Discharge Instructions (Signed)
Sciatica  Sciatica is pain, weakness, tingling, or loss of feeling (numbness) along the sciatic nerve. The sciatic nerve starts in the lower back and goes down the back of each leg. Sciatica usually goes away on its own or with treatment. Sometimes, sciatica may come back (recur). What are the causes? This condition happens when the sciatic nerve is pinched or has pressure put on it. This may be the result of:  A disk in between the bones of the spine bulging out too far (herniated disk).  Changes in the spinal disks that occur with aging.  A condition that affects a muscle in the butt.  Extra bone growth near the sciatic nerve.  A break (fracture) of the area between your hip bones (pelvis).  Pregnancy.  Tumor. This is rare. What increases the risk? You are more likely to develop this condition if you:  Play sports that put pressure or stress on the spine.  Have poor strength and ease of movement (flexibility).  Have had a back injury in the past.  Have had back surgery.  Sit for long periods of time.  Do activities that involve bending or lifting over and over again.  Are very overweight (obese). What are the signs or symptoms? Symptoms can vary from mild to very bad. They may include:  Any of these problems in the lower back, leg, hip, or butt: ? Mild tingling, loss of feeling, or dull aches. ? Burning sensations. ? Sharp pains.  Loss of feeling in the back of the calf or the sole of the foot.  Leg weakness.  Very bad back pain that makes it hard to move. These symptoms may get worse when you cough, sneeze, or laugh. They may also get worse when you sit or stand for long periods of time. How is this treated? This condition often gets better without any treatment. However, treatment may include:  Changing or cutting back on physical activity when you have pain.  Doing exercises and stretching.  Putting ice or heat on the affected area.  Medicines that  help: ? To relieve pain and swelling. ? To relax your muscles.  Shots (injections) of medicines that help to relieve pain, irritation, and swelling.  Surgery. Follow these instructions at home: Medicines  Take over-the-counter and prescription medicines only as told by your doctor.  Ask your doctor if the medicine prescribed to you: ? Requires you to avoid driving or using heavy machinery. ? Can cause trouble pooping (constipation). You may need to take these steps to prevent or treat trouble pooping:  Drink enough fluids to keep your pee (urine) pale yellow.  Take over-the-counter or prescription medicines.  Eat foods that are high in fiber. These include beans, whole grains, and fresh fruits and vegetables.  Limit foods that are high in fat and sugar. These include fried or sweet foods. Managing pain      If told, put ice on the affected area. ? Put ice in a plastic bag. ? Place a towel between your skin and the bag. ? Leave the ice on for 20 minutes, 2-3 times a day.  If told, put heat on the affected area. Use the heat source that your doctor tells you to use, such as a moist heat pack or a heating pad. ? Place a towel between your skin and the heat source. ? Leave the heat on for 20-30 minutes. ? Remove the heat if your skin turns bright red. This is very important if you are   unable to feel pain, heat, or cold. You may have a greater risk of getting burned. Activity   Return to your normal activities as told by your doctor. Ask your doctor what activities are safe for you.  Avoid activities that make your symptoms worse.  Take short rests during the day. ? When you rest for a long time, do some physical activity or stretching between periods of rest. ? Avoid sitting for a long time without moving. Get up and move around at least one time each hour.  Exercise and stretch regularly, as told by your doctor.  Do not lift anything that is heavier than 10 lb (4.5 kg)  while you have symptoms of sciatica. ? Avoid lifting heavy things even when you do not have symptoms. ? Avoid lifting heavy things over and over.  When you lift objects, always lift in a way that is safe for your body. To do this, you should: ? Bend your knees. ? Keep the object close to your body. ? Avoid twisting. General instructions  Stay at a healthy weight.  Wear comfortable shoes that support your feet. Avoid wearing high heels.  Avoid sleeping on a mattress that is too soft or too hard. You might have less pain if you sleep on a mattress that is firm enough to support your back.  Keep all follow-up visits as told by your doctor. This is important. Contact a doctor if:  You have pain that: ? Wakes you up when you are sleeping. ? Gets worse when you lie down. ? Is worse than the pain you have had in the past. ? Lasts longer than 4 weeks.  You lose weight without trying. Get help right away if:  You cannot control when you pee (urinate) or poop (have a bowel movement).  You have weakness in any of these areas and it gets worse: ? Lower back. ? The area between your hip bones. ? Butt. ? Legs.  You have redness or swelling of your back.  You have a burning feeling when you pee. Summary  Sciatica is pain, weakness, tingling, or loss of feeling (numbness) along the sciatic nerve.  This condition happens when the sciatic nerve is pinched or has pressure put on it.  Sciatica can cause pain, tingling, or loss of feeling (numbness) in the lower back, legs, hips, and butt.  Treatment often includes rest, exercise, medicines, and putting ice or heat on the affected area. This information is not intended to replace advice given to you by your health care provider. Make sure you discuss any questions you have with your health care provider. Document Revised: 08/01/2018 Document Reviewed: 08/01/2018 Elsevier Patient Education  2020 Elsevier Inc.  
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
## 2019-10-16 ENCOUNTER — Other Ambulatory Visit: Payer: Medicaid Other

## 2019-10-16 ENCOUNTER — Encounter: Payer: Self-pay | Admitting: Obstetrics & Gynecology

## 2019-10-16 ENCOUNTER — Encounter (HOSPITAL_COMMUNITY): Payer: Self-pay | Admitting: *Deleted

## 2019-10-16 ENCOUNTER — Ambulatory Visit (HOSPITAL_COMMUNITY)
Admission: RE | Admit: 2019-10-16 | Discharge: 2019-10-16 | Disposition: A | Discharge status: Home / Self Care | Payer: Medicaid Other | Source: Ambulatory Visit | Attending: Obstetrics and Gynecology | Admitting: Obstetrics and Gynecology

## 2019-10-16 ENCOUNTER — Other Ambulatory Visit: Payer: Self-pay

## 2019-10-16 ENCOUNTER — Ambulatory Visit (INDEPENDENT_AMBULATORY_CARE_PROVIDER_SITE_OTHER): Payer: Medicaid Other | Admitting: Obstetrics & Gynecology

## 2019-10-16 ENCOUNTER — Ambulatory Visit (HOSPITAL_COMMUNITY): Payer: Medicaid Other | Admitting: *Deleted

## 2019-10-16 VITALS — BP 99/68 | HR 82 | Wt 162.7 lb

## 2019-10-16 DIAGNOSIS — Z362 Encounter for other antenatal screening follow-up: Secondary | ICD-10-CM | POA: Diagnosis not present

## 2019-10-16 DIAGNOSIS — Z3A27 27 weeks gestation of pregnancy: Secondary | ICD-10-CM

## 2019-10-16 DIAGNOSIS — O26872 Cervical shortening, second trimester: Secondary | ICD-10-CM | POA: Diagnosis not present

## 2019-10-16 DIAGNOSIS — O0992 Supervision of high risk pregnancy, unspecified, second trimester: Secondary | ICD-10-CM

## 2019-10-16 DIAGNOSIS — M549 Dorsalgia, unspecified: Secondary | ICD-10-CM

## 2019-10-16 DIAGNOSIS — O26879 Cervical shortening, unspecified trimester: Secondary | ICD-10-CM | POA: Diagnosis not present

## 2019-10-16 DIAGNOSIS — O99891 Other specified diseases and conditions complicating pregnancy: Secondary | ICD-10-CM

## 2019-10-16 DIAGNOSIS — Z3400 Encounter for supervision of normal first pregnancy, unspecified trimester: Secondary | ICD-10-CM | POA: Diagnosis not present

## 2019-10-16 MED ORDER — CYCLOBENZAPRINE HCL 10 MG PO TABS: 10.0000 mg | ORAL_TABLET | Freq: Three times a day (TID) | ORAL | 2 refills | Status: DC | PRN

## 2019-10-16 NOTE — Patient Instructions (Addendum)
Return to office for any scheduled appointments. Call the office or go to the MAU at [facility] at [facility] if:  You begin to have strong, frequent contractions  Your water breaks.  Sometimes it is a big gush of fluid, sometimes it is just a trickle that keeps getting your panties wet or running down your legs  You have vaginal bleeding.  It is normal to have a small amount of spotting if your cervix was checked.   You do not feel your baby moving like normal.  If you do not, get something to eat and drink and lay down and focus on feeling your baby move.   If your baby is still not moving like normal, you should call the office or go to MAU.  Any other obstetric concerns.  Back Pain in Pregnancy Back pain during pregnancy is common. Back pain may be caused by several factors that are related to changes during your pregnancy. Follow these instructions at home: Managing pain, stiffness, and swelling      If directed, for sudden (acute) back pain, put ice on the painful area. ? Put ice in a plastic bag. ? Place a towel between your skin and the bag. ? Leave the ice on for 20 minutes, 2-3 times per day.  If directed, apply heat to the affected area before you exercise. Use the heat source that your health care provider recommends, such as a moist heat pack or a heating pad. ? Place a towel between your skin and the heat source. ? Leave the heat on for 20-30 minutes. ? Remove the heat if your skin turns bright red. This is especially important if you are unable to feel pain, heat, or cold. You may have a greater risk of getting burned.  If directed, massage the affected area. Activity  Exercise as told by your health care provider. Gentle exercise is the best way to prevent or manage back pain.  Listen to your body when lifting. If lifting hurts, ask for help or bend your knees. This uses your leg muscles instead of your back muscles.  Squat down when picking up  something from the floor. Do not bend over.  Only use bed rest for short periods as told by your health care provider. Bed rest should only be used for the most severe episodes of back pain. Standing, sitting, and lying down  Do not stand in one place for long periods of time.  Use good posture when sitting. Make sure your head rests over your shoulders and is not hanging forward. Use a pillow on your lower back if necessary.  Try sleeping on your side, preferably the left side, with a pregnancy support pillow or 1-2 regular pillows between your legs. ? If you have back pain after a night's rest, your bed may be too soft. ? A firm mattress may provide more support for your back during pregnancy. General instructions  Do not wear high heels.  Eat a healthy diet. Try to gain weight within your health care provider's recommendations.  Use a maternity girdle, elastic sling, or back brace as told by your health care provider.  Take over-the-counter and prescription medicines only as told by your health care provider.  Work with a physical therapist or massage therapist to find ways to manage back pain. Acupuncture or massage therapy may be helpful.  Keep all follow-up visits as told by your health care provider. This is important. Contact a health care  provider if:  Your back pain interferes with your daily activities.  You have increasing pain in other parts of your body. Get help right away if:  You develop numbness, tingling, weakness, or problems with the use of your arms or legs.  You develop severe back pain that is not controlled with medicine.  You have a change in bowel or bladder control.  You develop shortness of breath, dizziness, or you faint.  You develop nausea, vomiting, or sweating.  You have back pain that is a rhythmic, cramping pain similar to labor pains. Labor pain is usually 1-2 minutes apart, lasts for about 1 minute, and involves a bearing down feeling or  pressure in your pelvis.  You have back pain and your water breaks or you have vaginal bleeding.  You have back pain or numbness that travels down your leg.  Your back pain developed after you fell.  You develop pain on one side of your back.  You see blood in your urine.  You develop skin blisters in the area of your back pain. Summary  Back pain may be caused by several factors that are related to changes during your pregnancy.  Follow instructions as told by your health care provider for managing pain, stiffness, and swelling.  Exercise as told by your health care provider. Gentle exercise is the best way to prevent or manage back pain.  Take over-the-counter and prescription medicines only as told by your health care provider.  Keep all follow-up visits as told by your health care provider. This is important. This information is not intended to replace advice given to you by your health care provider. Make sure you discuss any questions you have with your health care provider. Document Revised: 11/01/2018 Document Reviewed: 12/29/2017 Elsevier Patient Education  2020 [employer]   Third Trimester of Pregnancy The third trimester is from week 28 through week 40 (months 7 through 9). The third trimester is a time when the unborn baby (fetus) is growing rapidly. At the end of the ninth month, the fetus is about 20 inches in length and weighs 6-10 pounds. Body changes during your third trimester Your body will continue to go through many changes during pregnancy. The changes vary from woman to woman. During the third trimester:  Your weight will continue to increase. You can expect to gain 25-35 pounds (11-16 kg) by the end of the pregnancy.  You may begin to get stretch marks on your hips, abdomen, and breasts.  You may urinate more often because the fetus is moving lower into your pelvis and pressing on your bladder.  You may develop or continue to have heartburn. This is  caused by increased hormones that slow down muscles in the digestive tract.  You may develop or continue to have constipation because increased hormones slow digestion and cause the muscles that push waste through your intestines to relax.  You may develop hemorrhoids. These are swollen veins (varicose veins) in the rectum that can itch or be painful.  You may develop swollen, bulging veins (varicose veins) in your legs.  You may have increased body aches in the pelvis, back, or thighs. This is due to weight gain and increased hormones that are relaxing your joints.  You may have changes in your hair. These can include thickening of your hair, rapid growth, and changes in texture. Some women also have hair loss during or after pregnancy, or hair that feels dry or thin. Your hair will most likely return  to normal after your baby is born.  Your breasts will continue to grow and they will continue to become tender. A yellow fluid (colostrum) may leak from your breasts. This is the first milk you are producing for your baby.  Your belly button may stick out.  You may notice more swelling in your hands, face, or ankles.  You may have increased tingling or numbness in your hands, arms, and legs. The skin on your belly may also feel numb.  You may feel short of breath because of your expanding uterus.  You may have more problems sleeping. This can be caused by the size of your belly, increased need to urinate, and an increase in your body's metabolism.  You may notice the fetus "dropping," or moving lower in your abdomen (lightening).  You may have increased vaginal discharge.  You may notice your joints feel loose and you may have pain around your pelvic bone. What to expect at prenatal visits You will have prenatal exams every 2 weeks until week 36. Then you will have weekly prenatal exams. During a routine prenatal visit:  You will be weighed to make sure you and the baby are growing  normally.  Your blood pressure will be taken.  Your abdomen will be measured to track your baby's growth.  The fetal heartbeat will be listened to.  Any test results from the previous visit will be discussed.  You may have a cervical check near your due date to see if your cervix has softened or thinned (effaced).  You will be tested for Group B streptococcus. This happens between 35 and 37 weeks. Your health care provider may ask you:  What your birth plan is.  How you are feeling.  If you are feeling the baby move.  If you have had any abnormal symptoms, such as leaking fluid, bleeding, severe headaches, or abdominal cramping.  If you are using any tobacco products, including cigarettes, chewing tobacco, and electronic cigarettes.  If you have any questions. Other tests or screenings that may be performed during your third trimester include:  Blood tests that check for low iron levels (anemia).  Fetal testing to check the health, activity level, and growth of the fetus. Testing is done if you have certain medical conditions or if there are problems during the pregnancy.  Nonstress test (NST). This test checks the health of your baby to make sure there are no signs of problems, such as the baby not getting enough oxygen. During this test, a belt is placed around your belly. The baby is made to move, and its heart rate is monitored during movement. What is false labor? False labor is a condition in which you feel small, irregular tightenings of the muscles in the womb (contractions) that usually go away with rest, changing position, or drinking water. These are called Braxton Hicks contractions. Contractions may last for hours, days, or even weeks before true labor sets in. If contractions come at regular intervals, become more frequent, increase in intensity, or become painful, you should see your health care provider. What are the signs of labor?  Abdominal cramps.  Regular  contractions that start at 10 minutes apart and become stronger and more frequent with time.  Contractions that start on the top of the uterus and spread down to the lower abdomen and back.  Increased pelvic pressure and dull back pain.  A watery or bloody mucus discharge that comes from the vagina.  Leaking of amniotic fluid. This  is also known as your "water breaking." It could be a slow trickle or a gush. Let your health care provider know if it has a color or strange odor. If you have any of these signs, call your health care provider right away, even if it is before your due date. Follow these instructions at home: Medicines  Follow your health care provider's instructions regarding medicine use. Specific medicines may be either safe or unsafe to take during pregnancy.  Take a prenatal vitamin that contains at least 600 micrograms (mcg) of folic acid.  If you develop constipation, try taking a stool softener if your health care provider approves. Eating and drinking   Eat a balanced diet that includes fresh fruits and vegetables, whole grains, good sources of protein such as meat, eggs, or tofu, and low-fat dairy. Your health care provider will help you determine the amount of weight gain that is right for you.  Avoid raw meat and uncooked cheese. These carry germs that can cause birth defects in the baby.  If you have low calcium intake from food, talk to your health care provider about whether you should take a daily calcium supplement.  Eat four or five small meals rather than three large meals a day.  Limit foods that are high in fat and processed sugars, such as fried and sweet foods.  To prevent constipation: ? Drink enough fluid to keep your urine clear or pale yellow. ? Eat foods that are high in fiber, such as fresh fruits and vegetables, whole grains, and beans. Activity  Exercise only as directed by your health care provider. Most women can continue their usual  exercise routine during pregnancy. Try to exercise for 30 minutes at least 5 days a week. Stop exercising if you experience uterine contractions.  Avoid heavy lifting.  Do not exercise in extreme heat or humidity, or at high altitudes.  Wear low-heel, comfortable shoes.  Practice good posture.  You may continue to have sex unless your health care provider tells you otherwise. Relieving pain and discomfort  Take frequent breaks and rest with your legs elevated if you have leg cramps or low back pain.  Take warm sitz baths to soothe any pain or discomfort caused by hemorrhoids. Use hemorrhoid cream if your health care provider approves.  Wear a good support bra to prevent discomfort from breast tenderness.  If you develop varicose veins: ? Wear support pantyhose or compression stockings as told by your healthcare provider. ? Elevate your feet for 15 minutes, 3-4 times a day. Prenatal care  Write down your questions. Take them to your prenatal visits.  Keep all your prenatal visits as told by your health care provider. This is important. Safety  Wear your seat belt at all times when driving.  Make a list of emergency phone numbers, including numbers for family, friends, the hospital, and police and fire departments. General instructions  Avoid cat litter boxes and soil used by cats. These carry germs that can cause birth defects in the baby. If you have a cat, ask someone to clean the litter box for you.  Do not travel far distances unless it is absolutely necessary and only with the approval of your health care provider.  Do not use hot tubs, steam rooms, or saunas.  Do not drink alcohol.  Do not use any products that contain nicotine or tobacco, such as cigarettes and e-cigarettes. If you need help quitting, ask your health care provider.  Do not use any  medicinal herbs or unprescribed drugs. These chemicals affect the formation and growth of the baby.  Do not douche or  use tampons or scented sanitary pads.  Do not cross your legs for long periods of time.  To prepare for the arrival of your baby: ? Take prenatal classes to understand, practice, and ask questions about labor and delivery. ? Make a trial run to the hospital. ? Visit the hospital and tour the maternity area. ? Arrange for maternity or paternity leave through employers. ? Arrange for family and friends to take care of pets while you are in the hospital. ? Purchase a rear-facing car seat and make sure you know how to install it in your car. ? Pack your hospital bag. ? Prepare the babys nursery. Make sure to remove all pillows and stuffed animals from the baby's crib to prevent suffocation.  Visit your dentist if you have not gone during your pregnancy. Use a soft toothbrush to brush your teeth and be gentle when you floss. Contact a health care provider if:  You are unsure if you are in labor or if your water has broken.  You become dizzy.  You have mild pelvic cramps, pelvic pressure, or nagging pain in your abdominal area.  You have lower back pain.  You have persistent nausea, vomiting, or diarrhea.  You have an unusual or bad smelling vaginal discharge.  You have pain when you urinate. Get help right away if:  Your water breaks before 37 weeks.  You have regular contractions less than 5 minutes apart before 37 weeks.  You have a fever.  You are leaking fluid from your vagina.  You have spotting or bleeding from your vagina.  You have severe abdominal pain or cramping.  You have rapid weight loss or weight gain.  You have shortness of breath with chest pain.  You notice sudden or extreme swelling of your face, hands, ankles, feet, or legs.  Your baby makes fewer than 10 movements in 2 hours.  You have severe headaches that do not go away when you take medicine.  You have vision changes. Summary  The third trimester is from week 28 through week 40, months 7  through 9. The third trimester is a time when the unborn baby (fetus) is growing rapidly.  During the third trimester, your discomfort may increase as you and your baby continue to gain weight. You may have abdominal, leg, and back pain, sleeping problems, and an increased need to urinate.  During the third trimester your breasts will keep growing and they will continue to become tender. A yellow fluid (colostrum) may leak from your breasts. This is the first milk you are producing for your baby.  False labor is a condition in which you feel small, irregular tightenings of the muscles in the womb (contractions) that eventually go away. These are called Braxton Hicks contractions. Contractions may last for hours, days, or even weeks before true labor sets in.  Signs of labor can include: abdominal cramps; regular contractions that start at 10 minutes apart and become stronger and more frequent with time; watery or bloody mucus discharge that comes from the vagina; increased pelvic pressure and dull back pain; and leaking of amniotic fluid. This information is not intended to replace advice given to you by your health care provider. Make sure you discuss any questions you have with your health care provider. Document Revised: 11/03/2018 Document Reviewed: 08/18/2016 Elsevier Patient Education  2020 [employer].
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
## 2019-10-16 NOTE — Progress Notes (Signed)
Pt is here for ROB, [redacted]w[redacted]d. Pt was scheduled to have 2 hr GTT today, but patient reports she ate watermelon this morning- will rescheduled GTT.

## 2019-10-17 ENCOUNTER — Other Ambulatory Visit: Payer: Medicaid Other

## 2019-10-30 ENCOUNTER — Other Ambulatory Visit: Payer: Self-pay

## 2019-10-30 ENCOUNTER — Ambulatory Visit (INDEPENDENT_AMBULATORY_CARE_PROVIDER_SITE_OTHER): Payer: Medicaid Other | Admitting: Obstetrics & Gynecology

## 2019-10-30 VITALS — BP 106/66 | HR 96 | Wt 165.7 lb

## 2019-10-30 DIAGNOSIS — O26879 Cervical shortening, unspecified trimester: Secondary | ICD-10-CM

## 2019-10-30 DIAGNOSIS — O0993 Supervision of high risk pregnancy, unspecified, third trimester: Secondary | ICD-10-CM

## 2019-10-30 DIAGNOSIS — Z3A29 29 weeks gestation of pregnancy: Secondary | ICD-10-CM

## 2019-10-30 DIAGNOSIS — O26873 Cervical shortening, third trimester: Secondary | ICD-10-CM

## 2019-10-30 NOTE — Progress Notes (Signed)
ROB, reports no problems today. 

## 2019-10-30 NOTE — Progress Notes (Signed)
   PRENATAL VISIT NOTE  Subjective:  Brandi Henson is a 24 y.o. G2P1001 at [redacted]w[redacted]d being seen today for ongoing prenatal care.  She is currently monitored for the following issues for this high-risk pregnancy and has Supervision of high-risk pregnancy; Poor weight gain of pregnancy, first trimester; Back pain complicating pregnancy; and Short cervix affecting pregnancy on their problem list.  Patient reports no complaints.  Contractions: Not present. Vag. Bleeding: None.  Movement: Present. Denies leaking of fluid.   The following portions of the patient's history were reviewed and updated as appropriate: allergies, current medications, past family history, past medical history, past social history, past surgical history and problem list.   Objective:   Vitals:   10/30/19 1501  BP: 106/66  Pulse: 96  Weight: 165 lb 11.2 oz (75.2 kg)    Fetal Status: Fetal Heart Rate (bpm): 138   Movement: Present     General:  Alert, oriented and cooperative. Patient is in no acute distress.  Skin: Skin is warm and dry. No rash noted.   Cardiovascular: Normal heart rate noted  Respiratory: Normal respiratory effort, no problems with respiration noted  Abdomen: Soft, gravid, appropriate for gestational age.  Pain/Pressure: Absent     Pelvic: Cervical exam deferred        Extremities: Normal range of motion.  Edema: None  Mental Status: Normal mood and affect. Normal behavior. Normal judgment and thought content.   Assessment and Plan:  Pregnancy: G2P1001 at [redacted]w[redacted]d 1. Supervision of high risk pregnancy in third trimester Normal growth on [place]  2. Short cervix affecting pregnancy 2.1 cm no funneling on last [place], continue progesterone  Preterm labor symptoms and general obstetric precautions including but not limited to vaginal bleeding, contractions, leaking of fluid and fetal movement were reviewed in detail with the patient. Please refer to After Visit Summary for other counseling recommendations.     Return in about 2 weeks (around 11/13/2019) for please schedule 2 hr GTT ASAP.  Future Appointments  Date Time Provider Department Center  11/13/2019  4:15 PM Brock Bad, MD [facility] None    Scheryl Darter, MD

## 2019-10-30 NOTE — Patient Instructions (Signed)

## 2019-11-12 ENCOUNTER — Inpatient Hospital Stay (HOSPITAL_COMMUNITY)
Admission: AD | Admit: 2019-11-12 | Discharge: 2019-11-12 | Disposition: A | Discharge status: Home / Self Care | Payer: Medicaid Other | Attending: Obstetrics and Gynecology | Admitting: Obstetrics and Gynecology

## 2019-11-12 ENCOUNTER — Other Ambulatory Visit: Payer: Self-pay

## 2019-11-12 ENCOUNTER — Encounter (HOSPITAL_COMMUNITY): Payer: Self-pay | Admitting: Obstetrics and Gynecology

## 2019-11-12 DIAGNOSIS — R109 Unspecified abdominal pain: Secondary | ICD-10-CM | POA: Diagnosis present

## 2019-11-12 DIAGNOSIS — O479 False labor, unspecified: Secondary | ICD-10-CM

## 2019-11-12 DIAGNOSIS — O26873 Cervical shortening, third trimester: Secondary | ICD-10-CM | POA: Insufficient documentation

## 2019-11-12 DIAGNOSIS — Z3A31 31 weeks gestation of pregnancy: Secondary | ICD-10-CM | POA: Insufficient documentation

## 2019-11-12 DIAGNOSIS — O4700 False labor before 37 completed weeks of gestation, unspecified trimester: Secondary | ICD-10-CM

## 2019-11-12 LAB — WET PREP, GENITAL
Clue Cells Wet Prep HPF POC: NONE SEEN
Sperm: NONE SEEN
Trich, Wet Prep: NONE SEEN
Yeast Wet Prep HPF POC: NONE SEEN

## 2019-11-12 LAB — URINALYSIS, ROUTINE W REFLEX MICROSCOPIC
Bilirubin Urine: NEGATIVE
Glucose, UA: NEGATIVE mg/dL
Hgb urine dipstick: NEGATIVE
Ketones, ur: NEGATIVE mg/dL
Nitrite: NEGATIVE
Protein, ur: 30 mg/dL — AB
Specific Gravity, Urine: 1.027 (ref 1.005–1.030)
pH: 7 (ref 5.0–8.0)

## 2019-11-12 MED ORDER — NIFEDIPINE 10 MG PO CAPS
10.0000 mg | ORAL_CAPSULE | ORAL | Status: AC | PRN
  Administered 2019-11-12: 10 mg via ORAL
  Filled 2019-11-12: qty 1

## 2019-11-12 MED ORDER — LACTATED RINGERS IV BOLUS
1000.0000 mL | Freq: Once | INTRAVENOUS | Status: AC
  Administered 2019-11-12: 1000 mL via INTRAVENOUS

## 2019-11-12 NOTE — MAU Note (Signed)
Patient came into MAU at [redacted]w[redacted]d gestation with c/o abdominal pain and contractions that started at 2am today. Denies any vaginal bleeding, denies any LOF, feels fetal movement.

## 2019-11-12 NOTE — Discharge Instructions (Signed)
Activity Restriction During Pregnancy °Your health care provider may recommend specific activity restrictions during pregnancy for a variety of reasons. Activity restriction may require that you limit activities that require great effort, such as exercise, lifting, or sex. °The type of activity restriction will vary for each person, depending on your risk or the problems you are having. Activity restriction may be recommended for a period of time until your baby is delivered. °Why are activity restrictions recommended? °Activity restriction may be recommended if: °· Your placenta is partially or completely covering the opening of your cervix (placenta previa). °· There is bleeding between the wall of the uterus and the amniotic sac in the first trimester of pregnancy (subchorionic hemorrhage). °· You went into labor too early (preterm labor). °· You have a history of miscarriage. °· You have a condition that causes high blood pressure during pregnancy (preeclampsia or eclampsia). °· You are pregnant with more than one baby. °· Your baby is not growing well. °What are the risks? °The risks depend on your specific restriction. Strict bed rest has the most physical and emotional risks and is no longer routinely recommended. Risks of strict bed rest include: °· Loss of muscle conditioning from not moving. °· Blood clots. °· Social isolation. °· Depression. °· Loss of income. °Talk with your health care team about activity restriction to decide if it is best for you and your baby. Even if you are having problems during your pregnancy, you may be able to continue with normal levels of activity with careful monitoring by your health care team. °Follow these instructions at home: °If needed, based on your overall health and the health of your baby, your health care provider will decide which type of activity restriction is right for you. Activity restrictions may include: °· Not lifting anything heavier than 10 pounds (4.5  kg). °· Avoiding activities that take a lot of physical effort. °· No lifting or straining. °· Resting in a sitting position or lying down for periods of time during the day. °Pelvic rest may be recommended along with activity restrictions. If pelvic rest is recommended, then: °· Do not have sex, an orgasm, or use sexual stimulation. °· Do not use tampons. Do not douche. Do not put anything into your vagina. °· Do not lift anything that is heavier than 10 lb (4.5 kg). °· Avoid activities that require a lot of effort. °· Avoid any activity in which your pelvic muscles could become strained, such as squatting. °Questions to ask your health care provider °· Why is my activity being limited? °· How will activity restrictions affect my body? °· Why is rest helpful for me and my baby? °· What activities can I do? °· When can I return to normal activities? °When should I seek immediate medical care? °Seek immediate medical care if you have: °· Vaginal bleeding. °· Vaginal discharge. °· Cramping pain in your lower abdomen. °· Regular contractions. °· A low, dull backache. °Summary °· Your health care provider may recommend specific activity restrictions during pregnancy for a variety of reasons. °· Activity restriction may require that you limit activities such as exercise, lifting, sex, or any other activity that requires great effort. °· Discuss the risks and benefits of activity restriction with your health care team to decide if it is best for you and your baby. °· Contact your health care provider right away if you think you are having contractions, or if you notice vaginal bleeding, discharge, or cramping. °This information is not   intended to replace advice given to you by your health care provider. Make sure you discuss any questions you have with your health care provider. Document Revised: 04/05/2019 Document Reviewed: 11/02/2017 Elsevier Patient Education  2020 [employer]  Safe Medications in Pregnancy    Acne: Benzoyl Peroxide Salicylic Acid  Backache/Headache: Tylenol: 2 regular strength every 4 hours OR              2 Extra strength every 6 hours  Colds/Coughs/Allergies: Benadryl (alcohol free) 25 mg every 6 hours as needed Breath right strips Claritin Cepacol throat lozenges Chloraseptic throat spray Cold-Eeze- up to three times per day Cough drops, alcohol free Flonase (by prescription only) Guaifenesin Mucinex Robitussin DM (plain only, alcohol free) Saline nasal spray/drops Sudafed (pseudoephedrine) & Actifed ** use only after [redacted] weeks gestation and if you do not have high blood pressure Tylenol Vicks Vaporub Zinc lozenges Zyrtec   Constipation: Colace Ducolax suppositories Fleet enema Glycerin suppositories Metamucil Milk of magnesia Miralax Senokot Smooth move tea  Diarrhea: Kaopectate Imodium A-D  *NO pepto Bismol  Hemorrhoids: Anusol Anusol HC Preparation H Tucks  Indigestion: Tums Maalox Mylanta Zantac  Pepcid  Insomnia: Benadryl (alcohol free) 25mg  every 6 hours as needed Tylenol PM Unisom, no Gelcaps  Leg Cramps: Tums MagGel  Nausea/Vomiting:  Bonine Dramamine Emetrol Ginger extract Sea bands Meclizine  Nausea medication to take during pregnancy:  Unisom (doxylamine succinate 25 mg tablets) Take one tablet daily at bedtime. If symptoms are not adequately controlled, the dose can be increased to a maximum recommended dose of two tablets daily (1/2 tablet in the morning, 1/2 tablet mid-afternoon and one at bedtime). Vitamin B6 100mg  tablets. Take one tablet twice a day (up to 200 mg per day).  Skin Rashes: Aveeno products Benadryl cream or 25mg  every 6 hours as needed Calamine Lotion 1% cortisone cream  Yeast infection: Gyne-lotrimin 7 Monistat 7   **If taking multiple medications, please check labels to avoid duplicating the same active ingredients **take medication as directed on the label ** Do not exceed 4000  mg of tylenol in 24 hours **Do not take medications that contain aspirin or ibuprofen

## 2019-11-12 NOTE — MAU Provider Note (Signed)
History     CSN: [id number]  Arrival date and time: 11/12/19 1347   First Provider Initiated Contact with Patient 11/12/19 1438      Chief Complaint  Patient presents with  . Abdominal Pain  . Contractions   HPI  Brandi Henson is a 24 y.o. G2P1001 at [redacted]w[redacted]d who presents with abdominal pain and contractions. She states it started last night at 0200 and has persisted. 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 She rates the contractions a 5/10. She denies any leaking or bleeding. Reports normal fetal movement.   This pregnancy has been complicated by a shortened cervix, on progesterone.   OB History    Gravida  2   Para  1   Term  1   Preterm      AB      Living  1     SAB      TAB      Ectopic      Multiple  0   Live Births  1           History reviewed. No pertinent past medical history.  Past Surgical History:  Procedure Laterality Date  . NO PAST SURGERIES      Family History  Problem Relation Age of Onset  . Diabetes Maternal Grandfather   . Diabetes Mother   . Hypertension Mother   . Healthy Father     Social History   Tobacco Use  . Smoking status: Never Smoker  . Smokeless tobacco: Never Used  Substance Use Topics  . Alcohol use: Not Currently  . Drug use: Not Currently    Frequency: 3.0 times per week    Types: Marijuana    Comment: pt states last in 2018    Allergies: No Known Allergies  Medications Prior to Admission  Medication Sig Dispense Refill Last Dose  . cyclobenzaprine (FLEXERIL) 10 MG tablet Take 1 tablet (10 mg total) by mouth 3 (three) times daily as needed for muscle spasms. 30 tablet 2 Past Month at Unknown time  . Prenatal Vit-Fe Fumarate-FA (PNV PRENATAL PLUS MULTIVITAMIN) 27-1 MG TABS Take 1 tablet by mouth daily. 30 tablet 11 11/12/2019 at Unknown time  . progesterone 200 MG SUPP Place 1 suppository (200 mg total) vaginally at bedtime. 30 suppository 0 11/11/2019 at  Unknown time  . metoCLOPramide (REGLAN) 10 MG tablet Take 1 tablet (10 mg total) by mouth 3 (three) times daily before meals. 90 tablet 2 Unknown at Unknown time  . terconazole (TERAZOL 3) 0.8 % vaginal cream Place 1 applicator vaginally at bedtime. Apply nightly for three nights. (Patient not taking: Reported on 10/16/2019) 20 g 0     Review of Systems  Constitutional: Negative.  Negative for fatigue and fever.  HENT: Negative.   Respiratory: Negative.  Negative for shortness of breath.   Cardiovascular: Negative.  Negative for chest pain.  Gastrointestinal: Positive for abdominal pain. Negative for constipation, diarrhea, nausea and vomiting.  Genitourinary: Negative.  Negative for dysuria, vaginal bleeding and vaginal discharge.  Neurological: Negative.  Negative for dizziness and headaches.   Physical Exam   Blood pressure (!) 107/57, pulse 88, temperature 98.6 F (37 C), temperature source Oral, resp. rate 18, height 5\' 10"  (1.778 m), weight 74.8 kg, last menstrual period 04/06/2019, SpO2 98 %.  Physical Exam  Nursing note and vitals reviewed. Constitutional: She is oriented to person, place, and time. She appears well-developed and well-nourished. No  distress.  HENT:  Head: Normocephalic.  Eyes: Pupils are equal, round, and reactive to light.  Cardiovascular: Normal rate, regular rhythm and normal heart sounds.  Respiratory: Effort normal and breath sounds normal. No respiratory distress.  GI: Soft. Bowel sounds are normal. She exhibits no distension. There is no abdominal tenderness.  Neurological: She is alert and oriented to person, place, and time.  Skin: Skin is warm and dry.  Psychiatric: She has a normal mood and affect. Her behavior is normal. Judgment and thought content normal.   Fetal Tracing:  Baseline: 125 Variability: moderate Accels: 15x15 Decels: none  Toco: ui with occasional uc's  Cervix: closed/50/posterior  MAU Course  Procedures Results for  orders placed or performed during the hospital encounter of 11/12/19 (from the past 24 hour(s))  Urinalysis, Routine w reflex microscopic     Status: Abnormal   Collection Time: 11/12/19  2:31 PM  Result Value Ref Range   Color, Urine YELLOW YELLOW   APPearance CLOUDY (A) CLEAR   Specific Gravity, Urine 1.027 1.005 - 1.030   pH 7.0 5.0 - 8.0   Glucose, UA NEGATIVE NEGATIVE mg/dL   Hgb urine dipstick NEGATIVE NEGATIVE   Bilirubin Urine NEGATIVE NEGATIVE   Ketones, ur NEGATIVE NEGATIVE mg/dL   Protein, ur 30 (A) NEGATIVE mg/dL   Nitrite NEGATIVE NEGATIVE   Leukocytes,Ua TRACE (A) NEGATIVE   WBC, UA 6-10 0 - 5 WBC/hpf   Bacteria, UA RARE (A) NONE SEEN   Squamous Epithelial / LPF 6-10 0 - 5   Mucus PRESENT   Wet prep, genital     Status: Abnormal   Collection Time: 11/12/19  2:49 PM   Specimen: Vaginal  Result Value Ref Range   Yeast Wet Prep HPF POC NONE SEEN NONE SEEN   Trich, Wet Prep NONE SEEN NONE SEEN   Clue Cells Wet Prep HPF POC NONE SEEN NONE SEEN   WBC, Wet Prep HPF POC MODERATE (A) NONE SEEN   Sperm NONE SEEN    MDM UA LR bolus Wet prep and gc/chlamydia  Patient states she doesn't feel any better after fluid bolus. Will try procardia- patient reports improvement. Cervix unchanged after 3 hours  Patient requesting to be written out of work or work restrictions. Discussed that patient needs to speak with OB tomorrow at prenatal care. Will give work note for today.   Assessment and Plan   1. Preterm contractions   2. [redacted] weeks gestation of pregnancy    -Discharge home in stable condition -Preterm labor precautions discussed -Patient advised to follow-up with [place] as scheduled for prenatal care -Patient may return to MAU as needed or if her condition were to change or worsen   Wende Mott CNM 11/12/2019, 2:38 PM

## 2019-11-13 ENCOUNTER — Telehealth (INDEPENDENT_AMBULATORY_CARE_PROVIDER_SITE_OTHER): Payer: Medicaid Other | Admitting: Obstetrics

## 2019-11-13 ENCOUNTER — Encounter: Payer: Self-pay | Admitting: Obstetrics

## 2019-11-13 DIAGNOSIS — O26879 Cervical shortening, unspecified trimester: Secondary | ICD-10-CM

## 2019-11-13 DIAGNOSIS — Z3A31 31 weeks gestation of pregnancy: Secondary | ICD-10-CM

## 2019-11-13 DIAGNOSIS — O99891 Other specified diseases and conditions complicating pregnancy: Secondary | ICD-10-CM

## 2019-11-13 DIAGNOSIS — O0993 Supervision of high risk pregnancy, unspecified, third trimester: Secondary | ICD-10-CM

## 2019-11-13 DIAGNOSIS — M549 Dorsalgia, unspecified: Secondary | ICD-10-CM

## 2019-11-13 DIAGNOSIS — O26873 Cervical shortening, third trimester: Secondary | ICD-10-CM

## 2019-11-13 LAB — GC/CHLAMYDIA PROBE AMP (~~LOC~~) NOT AT ARMC
Chlamydia: NEGATIVE
Comment: NEGATIVE
Comment: NORMAL
Neisseria Gonorrhea: NEGATIVE

## 2019-11-13 NOTE — Progress Notes (Signed)
S/w pt for virtual visit. Pt reports fetal movement, denies pain, reports some pressure. Pt does not have BP cuff today.

## 2019-11-13 NOTE — Progress Notes (Signed)
OBSTETRICS PRENATAL VIRTUAL VISIT ENCOUNTER NOTE  Provider location: [facility] [place]   I connected with Brandi Henson on 11/13/19 at  4:15 PM EDT by MyChart Video Encounter at home and verified that I am speaking with the correct person using two identifiers.   I discussed the limitations, risks, security and privacy concerns of performing an evaluation and management service virtually and the availability of in person appointments. I also discussed with the patient that there may be a patient responsible charge related to this service. The patient expressed understanding and agreed to proceed. Subjective:  Brandi Henson is a 24 y.o. G2P1001 at [redacted]w[redacted]d being seen today for ongoing prenatal care.  She is currently monitored for the following issues for this high-risk pregnancy and has Supervision of high-risk pregnancy; Poor weight gain of pregnancy, first trimester; Back pain complicating pregnancy; and Short cervix affecting pregnancy on their problem list.  Patient reports backache and occasional contractions.  Contractions: Not present. Vag. Bleeding: None.  Movement: Present. Denies any leaking of fluid.   The following portions of the patient's history were reviewed and updated as appropriate: allergies, current medications, past family history, past medical history, past social history, past surgical history and problem list.   Objective:  There were no vitals filed for this visit.  Fetal Status:     Movement: Present     General:  Alert, oriented and cooperative. Patient is in no acute distress.  Respiratory: Normal respiratory effort, no problems with respiration noted  Mental Status: Normal mood and affect. Normal behavior. Normal judgment and thought content.  Rest of physical exam deferred due to type of encounter  Imaging: [place] MFM OB FOLLOW UP  Result Date: 10/16/2019 ----------------------------------------------------------------------  OBSTETRICS  REPORT                       (Signed Final 10/16/2019 03:32 pm) ---------------------------------------------------------------------- Patient Info  ID #:       [id number]                          D.O.B.:  1995/12/13 (23 yrs)  Name:       Brandi Henson                 Visit Date: 10/16/2019 03:00 pm ---------------------------------------------------------------------- Performed By  Performed By:     Lenise Arena        Ref. Address:     [address]                                                             [phone]  [place]                                                             [place]  Attending:        Tama High MD        [facility]  Referred By:      [facility] ---------------------------------------------------------------------- Orders   #  Description                          Code         Ordered By   1  [place] MFM OB FOLLOW UP                  [phone]     YU FANG  ----------------------------------------------------------------------   #  Order #                    Accession #                 Episode #   1  [id number]                  [id number]                  [id number]  ---------------------------------------------------------------------- Indications   Cervical shortening, second trimester          O26.872   Encounter for other antenatal screening        Z36.2   follow-up   [redacted] weeks gestation of pregnancy                Z3A.27  ---------------------------------------------------------------------- Fetal Evaluation  Num Of Fetuses:         1  Fetal Heart Rate(bpm):  148  Cardiac Activity:       Observed  Presentation:           Cephalic  Placenta:               Anterior  P. Cord Insertion:      Previously Visualized  Amniotic Fluid  AFI FV:       Within normal limits                              Largest Pocket(cm)                              6.98 ---------------------------------------------------------------------- Biometry  BPD:        73  mm     G. Age:  29w 2d         88  %    CI:        74.86   %    70 - 86  FL/HC:      19.5   %    18.8 - 20.6  HC:      267.7  mm     G. Age:  29w 1d         72  %    HC/AC:      1.11        1.05 - 1.21  AC:      240.6  mm     G. Age:  28w 2d         66  %    FL/BPD:     71.4   %    71 - 87  FL:       52.1  mm     G. Age:  27w 5d         41  %    FL/AC:      21.7   %    20 - 24  Est. FW:    1205  gm    2 lb 11 oz      67  % ---------------------------------------------------------------------- OB History  Gravidity:    2         Term:   1        Prem:   0        SAB:   0  TOP:          0       Ectopic:  0        Living: 1 ---------------------------------------------------------------------- Gestational Age  LMP:           27w 4d        Date:  04/06/19                 EDD:   01/11/20  U/S Today:     28w 4d                                        EDD:   01/04/20  Best:          27w 4d     Det. By:  LMP  (04/06/19)          EDD:   01/11/20 ---------------------------------------------------------------------- Anatomy  Cranium:               Appears normal         LVOT:                   Appears normal  Cavum:                 Appears normal         Aortic Arch:            Previously seen  Ventricles:            Appears normal         Ductal Arch:            Previously seen  Choroid Plexus:        Previously seen        Diaphragm:              Appears normal  Cerebellum:            Previously seen        Stomach:                Appears  normal, left                                                                        sided  Posterior Fossa:       Previously seen        Abdomen:                Appears normal  Nuchal Fold:           Previously seen        Abdominal Wall:          Previously seen  Face:                  Orbits and profile     Cord Vessels:           Previously seen                         previously seen  Lips:                  Previously seen        Kidneys:                Appear normal  Palate:                Previously seen        Bladder:                Appears normal  Thoracic:              Appears normal         Spine:                  Previously seen  Heart:                 Appears normal         Upper Extremities:      Previously seen                         (4CH, axis, and                         situs)  RVOT:                  Appears normal         Lower Extremities:      Previously seen  Other:  Female gender previously seen. Heels visualized previously. Nasal bone          visualized previously.Marland Kitchen ---------------------------------------------------------------------- Cervix Uterus Adnexa  Cervix  Not visualized (advanced GA >24wks) ---------------------------------------------------------------------- Impression  Patient takes vaginal progesterone following the diagnosis of  cervical incompetence. She does not have symptoms of  preterm labor.  Fetal growth is appropriate for gestational age. Amniotic fluid  is normal and good fetal activity is seen. On transabdominal  scan, the cervix measures 2.1 cm with no funneling.  We reassured the patient of the findings. ---------------------------------------------------------------------- Recommendations  Follow-up scans as clinically indicated. ----------------------------------------------------------------------                  Noralee Space, MD Electronically  Signed Final Report   10/16/2019 03:32 pm ----------------------------------------------------------------------   Assessment and Plan:  Pregnancy: G2P1001 at [redacted]w[redacted]d 1. Supervision of high risk pregnancy in third trimester  2. Short cervix affecting pregnancy - preterm labor precautions given  3. Back pain during pregnancy - maternity belt Rx -  work hours decreased to 4 hours per day   Preterm labor symptoms and general obstetric precautions including but not limited to vaginal bleeding, contractions, leaking of fluid and fetal movement were reviewed in detail with the patient. I discussed the assessment and treatment plan with the patient. The patient was provided an opportunity to ask questions and all were answered. The patient agreed with the plan and demonstrated an understanding of the instructions. The patient was advised to call back or seek an in-person office evaluation/go to MAU at [facility] for any urgent or concerning symptoms. Please refer to After Visit Summary for other counseling recommendations.   I provided 10 minutes of face-to-face time during this encounter.  Return in about 2 weeks (around 11/27/2019) for MyChart HOB-Faculty Only.  Future Appointments  Date Time Provider Department Center  11/13/2019  4:15 PM Brock Bad, MD [facility] None  11/27/2019 11:15 AM Constant, Gigi Gin, MD [facility] None    Coral Ceo, MD Center for [facility], [facility] 11/13/2019

## 2019-11-27 ENCOUNTER — Telehealth (INDEPENDENT_AMBULATORY_CARE_PROVIDER_SITE_OTHER): Payer: Medicaid Other | Admitting: Obstetrics and Gynecology

## 2019-11-27 ENCOUNTER — Encounter: Payer: Self-pay | Admitting: Obstetrics and Gynecology

## 2019-11-27 DIAGNOSIS — O0993 Supervision of high risk pregnancy, unspecified, third trimester: Secondary | ICD-10-CM

## 2019-11-27 DIAGNOSIS — O26873 Cervical shortening, third trimester: Secondary | ICD-10-CM

## 2019-11-27 DIAGNOSIS — O26879 Cervical shortening, unspecified trimester: Secondary | ICD-10-CM

## 2019-11-27 DIAGNOSIS — Z3A33 33 weeks gestation of pregnancy: Secondary | ICD-10-CM

## 2019-11-27 NOTE — Progress Notes (Signed)
   OBSTETRICS PRENATAL VIRTUAL VISIT ENCOUNTER NOTE  Provider location: [facility]   I connected with [name] on 11/27/19 at 11:15 AM EDT by MyChart Video Encounter at home and verified that I am speaking with the correct person using two identifiers.   I discussed the limitations, risks, security and privacy concerns of performing an evaluation and management service virtually and the availability of in person appointments. I also discussed with the patient that there may be a patient responsible charge related to this service. The patient expressed understanding and agreed to proceed. Subjective:  Brandi Henson is a 24 y.o. G2P1001 at [redacted]w[redacted]d being seen today for ongoing prenatal care.  She is currently monitored for the following issues for this high-risk pregnancy and has Supervision of high-risk pregnancy; Poor weight gain of pregnancy, first trimester; Back pain complicating pregnancy; and Short cervix affecting pregnancy on their problem list.  Patient reports occasional contractions.  Contractions: Irregular. Vag. Bleeding: None.  Movement: Present. Denies any leaking of fluid.   The following portions of the patient's history were reviewed and updated as appropriate: allergies, current medications, past family history, past medical history, past social history, past surgical history and problem list.   Objective:  There were no vitals filed for this visit.  Fetal Status:     Movement: Present     General:  Alert, oriented and cooperative. Patient is in no acute distress.  Respiratory: Normal respiratory effort, no problems with respiration noted  Mental Status: Normal mood and affect. Normal behavior. Normal judgment and thought content.  Rest of physical exam deferred due to type of encounter  Imaging: No results found.  Assessment and Plan:  Pregnancy: G2P1001 at [redacted]w[redacted]d 1. Supervision of high risk pregnancy in third trimester Patient is  doing well reporting occasional contractions Precautions reviewed Third trimester labs this week  2. Short cervix affecting pregnancy Patient discontinued progesterone a week ago. Advised patient to resume  Preterm labor symptoms and general obstetric precautions including but not limited to vaginal bleeding, contractions, leaking of fluid and fetal movement were reviewed in detail with the patient. I discussed the assessment and treatment plan with the patient. The patient was provided an opportunity to ask questions and all were answered. The patient agreed with the plan and demonstrated an understanding of the instructions. The patient was advised to call back or seek an in-person office evaluation/go to MAU at [facility] for any urgent or concerning symptoms. Please refer to After Visit Summary for other counseling recommendations.   I provided 11 minutes of face-to-face time during this encounter.  No follow-ups on file.  Future Appointments  Date Time Provider Department Center  11/27/2019 11:15 AM Cybil Senegal, Gigi Gin, MD [facility] None    Catalina Antigua, MD Center for [facility], [facility]

## 2019-11-29 ENCOUNTER — Other Ambulatory Visit: Payer: Medicaid Other

## 2019-11-29 ENCOUNTER — Other Ambulatory Visit: Payer: Self-pay

## 2019-11-29 DIAGNOSIS — O0993 Supervision of high risk pregnancy, unspecified, third trimester: Secondary | ICD-10-CM

## 2019-11-30 LAB — HIV ANTIBODY (ROUTINE TESTING W REFLEX): HIV Screen 4th Generation wRfx: NONREACTIVE

## 2019-11-30 LAB — RPR: RPR Ser Ql: NONREACTIVE

## 2019-11-30 LAB — CBC
Hematocrit: 32.2 % — ABNORMAL LOW (ref 34.0–46.6)
Hemoglobin: 10.2 g/dL — ABNORMAL LOW (ref 11.1–15.9)
MCH: 28.5 pg (ref 26.6–33.0)
MCHC: 31.7 g/dL (ref 31.5–35.7)
MCV: 90 fL (ref 79–97)
Platelets: 210 10*3/uL (ref 150–450)
RBC: 3.58 x10E6/uL — ABNORMAL LOW (ref 3.77–5.28)
RDW: 13.1 % (ref 11.7–15.4)
WBC: 7.9 10*3/uL (ref 3.4–10.8)

## 2019-11-30 LAB — GLUCOSE TOLERANCE, 2 HOURS W/ 1HR
Glucose, 1 hour: 73 mg/dL (ref 65–179)
Glucose, 2 hour: 80 mg/dL (ref 65–152)
Glucose, Fasting: 68 mg/dL (ref 65–91)

## 2019-12-01 ENCOUNTER — Telehealth (INDEPENDENT_AMBULATORY_CARE_PROVIDER_SITE_OTHER): Payer: Medicaid Other | Admitting: Obstetrics and Gynecology

## 2019-12-01 ENCOUNTER — Encounter: Payer: Self-pay | Admitting: Obstetrics and Gynecology

## 2019-12-01 DIAGNOSIS — Z3A34 34 weeks gestation of pregnancy: Secondary | ICD-10-CM

## 2019-12-01 DIAGNOSIS — O0993 Supervision of high risk pregnancy, unspecified, third trimester: Secondary | ICD-10-CM

## 2019-12-01 DIAGNOSIS — O26879 Cervical shortening, unspecified trimester: Secondary | ICD-10-CM

## 2019-12-01 DIAGNOSIS — O26873 Cervical shortening, third trimester: Secondary | ICD-10-CM

## 2019-12-01 NOTE — Progress Notes (Signed)
   OBSTETRICS PRENATAL VIRTUAL VISIT ENCOUNTER NOTE  Provider location: [facility]   I connected with Brandi Henson on 12/01/19 at  8:45 AM EDT by MyChart Video Encounter at home and verified that I am speaking with the correct person using two identifiers.   I discussed the limitations, risks, security and privacy concerns of performing an evaluation and management service virtually and the availability of in person appointments. I also discussed with the patient that there may be a patient responsible charge related to this service. The patient expressed understanding and agreed to proceed. Subjective:  Brandi Henson is a 24 y.o. G2P1001 at [redacted]w[redacted]d being seen today for ongoing prenatal care.  She is currently monitored for the following issues for this low-risk pregnancy and has Supervision of high-risk pregnancy; Poor weight gain of pregnancy, first trimester; Back pain complicating pregnancy; and Short cervix affecting pregnancy on their problem list.  Patient reports lower pelvic pressure and back pain.  Contractions: Irregular. Vag. Bleeding: None.  Movement: Present. Denies any leaking of fluid.   The following portions of the patient's history were reviewed and updated as appropriate: allergies, current medications, past family history, past medical history, past social history, past surgical history and problem list.   Objective:  There were no vitals filed for this visit.  Fetal Status:     Movement: Present     General:  Alert, oriented and cooperative. Patient is in no acute distress.  Respiratory: Normal respiratory effort, no problems with respiration noted  Mental Status: Normal mood and affect. Normal behavior. Normal judgment and thought content.  Rest of physical exam deferred due to type of encounter  Imaging: No results found.  Assessment and Plan:  Pregnancy: G2P1001 at [redacted]w[redacted]d 1. Short cervix affecting pregnancy Patient is considering  restarting progesterone  2. Supervision of high risk pregnancy in third trimester Patient is doing well and requesting to decrease number of working days. Working hours previously reduced to 4 hours per day by Dr. Clearance Coots. Patient is now requesting to decrease the number of days she is working due to lower back pain and pelvic pressure. Patient states her symptoms are unchanged from previous. Advised patient to continue wearing maternity support belt.  Patient to have a discussion regarding work hours with employer  Preterm labor symptoms and general obstetric precautions including but not limited to vaginal bleeding, contractions, leaking of fluid and fetal movement were reviewed in detail with the patient. I discussed the assessment and treatment plan with the patient. The patient was provided an opportunity to ask questions and all were answered. The patient agreed with the plan and demonstrated an understanding of the instructions. The patient was advised to call back or seek an in-person office evaluation/go to [facility]U at [facility] for any urgent or concerning symptoms. Please refer to After Visit Summary for other counseling recommendations.   I provided 10 minutes of face-to-face time during this encounter.  No follow-ups on file.  Future Appointments  Date Time Provider Department Center  12/01/2019  8:45 AM Rick Carruthers, Gigi Gin, MD [facility] None  12/11/2019  8:45 AM Adam Phenix, MD [facility] None    Catalina Antigua, MD [facility]

## 2019-12-04 ENCOUNTER — Telehealth: Payer: Self-pay

## 2019-12-04 NOTE — Telephone Encounter (Signed)
Pt called and reports that she is still experiencing pressure and it is causing her to not be able to work. Pt denies contractions or LOF and reports good fetal movement. Pt reports her job is not working with her on reducing her hours. I advised pt that she could go out on maternity leave early at this point but it would mean she had to return to work after the baby sooner. Pt is agreeable to this. I advised pt to have her job fax [place] paperwork to complete for her maternity leave with the correct dates, fax number provided. Pt voices understanding.

## 2019-12-04 NOTE — Telephone Encounter (Signed)
Pt called and LVM on nurse triage line requesting a call back, did not say what she needed. Called pt back, no answer and no option for VM.

## 2019-12-07 ENCOUNTER — Encounter (HOSPITAL_COMMUNITY): Payer: Self-pay | Admitting: Obstetrics & Gynecology

## 2019-12-07 ENCOUNTER — Inpatient Hospital Stay (HOSPITAL_COMMUNITY)
Admission: AD | Admit: 2019-12-07 | Discharge: 2019-12-07 | Disposition: A | Discharge status: Home / Self Care | Payer: Medicaid Other | Attending: Obstetrics & Gynecology | Admitting: Obstetrics & Gynecology

## 2019-12-07 ENCOUNTER — Other Ambulatory Visit: Payer: Self-pay

## 2019-12-07 DIAGNOSIS — O1203 Gestational edema, third trimester: Secondary | ICD-10-CM

## 2019-12-07 DIAGNOSIS — Z3A35 35 weeks gestation of pregnancy: Secondary | ICD-10-CM | POA: Diagnosis not present

## 2019-12-07 DIAGNOSIS — O2203 Varicose veins of lower extremity in pregnancy, third trimester: Secondary | ICD-10-CM | POA: Insufficient documentation

## 2019-12-07 LAB — WET PREP, GENITAL
Clue Cells Wet Prep HPF POC: NONE SEEN
Sperm: NONE SEEN
Trich, Wet Prep: NONE SEEN
Yeast Wet Prep HPF POC: NONE SEEN

## 2019-12-07 LAB — URINALYSIS, ROUTINE W REFLEX MICROSCOPIC
Bilirubin Urine: NEGATIVE
Glucose, UA: NEGATIVE mg/dL
Hgb urine dipstick: NEGATIVE
Ketones, ur: NEGATIVE mg/dL
Leukocytes,Ua: NEGATIVE
Nitrite: NEGATIVE
Protein, ur: NEGATIVE mg/dL
Specific Gravity, Urine: 1.027 (ref 1.005–1.030)
pH: 6 (ref 5.0–8.0)

## 2019-12-07 NOTE — Discharge Instructions (Signed)
Varicose Veins Varicose veins are veins that have become enlarged, bulged, and twisted. They most often appear in the legs. What are the causes? This condition is caused by damage to the valves in the vein. These valves help blood return to your heart. When they are damaged and they stop working properly, blood may flow backward and back up in the veins near the skin, causing the veins to get larger and appear twisted. The condition can result from any issue that causes blood to back up, like pregnancy, prolonged standing, or obesity. What increases the risk? This condition is more likely to develop in people who are:  On their feet a lot.  Pregnant.  Overweight. What are the signs or symptoms? Symptoms of this condition include:  Bulging, twisted, and bluish veins.  A feeling of heaviness. This may be worse at the end of the day.  Leg pain. This may be worse at the end of the day.  Swelling in the leg.  Changes in skin color over the veins. How is this diagnosed? This condition may be diagnosed based on your symptoms, a physical exam, and an ultrasound test. How is this treated? Treatment for this condition may involve:  Avoiding sitting or standing in one position for long periods of time.  Wearing compression stockings. These stockings help to prevent blood clots and reduce swelling in the legs.  Raising (elevating) the legs when resting.  Losing weight.  Exercising regularly. If you have persistent symptoms or want to improve the way your varicose veins look, you may choose to have a procedure to close the varicose veins off or to remove them. Treatments to close off the veins include:  Sclerotherapy. In this treatment, a solution is injected into a vein to close it off.  Laser treatment. In this treatment, the vein is heated with a laser to close it off.  Radiofrequency vein ablation. In this treatment, an electrical current produced by radio waves is used to close  off the vein. Treatments to remove the veins include:  Phlebectomy. In this treatment, the veins are removed through small incisions made over the veins.  Vein ligation and stripping. In this treatment, incisions are made over the veins. The veins are then removed after being tied (ligated) with stitches (sutures). Follow these instructions at home: Activity  Walk as much as possible. Walking increases blood flow. This helps blood return to the heart and takes pressure off your veins. It also increases your cardiovascular strength.  Follow your health care provider's instructions about exercising.  Do not stand or sit in one position for a long period of time.  Do not sit with your legs crossed.  Rest with your legs raised during the day. General instructions   Follow any diet instructions given to you by your health care provider.  Wear compression stockings as directed by your health care provider. Do not wear other kinds of tight clothing around your legs, pelvis, or waist.  Elevate your legs at night to above the level of your heart.  If you get a cut in the skin over the varicose vein and the vein bleeds: ? Lie down with your leg raised. ? Apply firm pressure to the cut with a clean cloth until the bleeding stops. ? Place a bandage (dressing) on the cut. Contact a health care provider if:  The skin around your varicose veins starts to break down.  You have pain, redness, tenderness, or hard swelling over a vein.  You   are uncomfortable because of pain.  You get a cut in the skin over a varicose vein and it will not stop bleeding. Summary  Varicose veins are veins that have become enlarged, bulged, and twisted. They most often appear in the legs.  This condition is caused by damage to the valves in the vein. These valves help blood return to your heart.  Treatment for this condition includes frequent movements, wearing compression stockings, losing weight, and  exercising regularly. In some cases, procedures are done to close off or remove the veins.  Treatment for this condition may include wearing compression stockings, elevating the legs, losing weight, and engaging in regular activity. In some cases, procedures are done to close off or remove the veins. This information is not intended to replace advice given to you by your health care provider. Make sure you discuss any questions you have with your health care provider. Document Revised: 09/08/2018 Document Reviewed: 08/05/2016 Elsevier Patient Education  2020 [employer]  

## 2019-12-07 NOTE — MAU Note (Signed)
Brandi Henson is a 24 y.o. at [redacted]w[redacted]d here in MAU reporting: increased cramping and pressure. Pain is mostly when she is up moving. When she wipes in the bathroom she notices some thick yellow discharge, no odor or itching. No bleeding. No LOF. +FM  Onset of complaint: 2-3 days  Pain score: 2/10  Vitals:   12/07/19 1454  BP: 106/67  Pulse: 88  Resp: 16  Temp: 98.6 F (37 C)  SpO2: 100%     FHT: +FM  Lab orders placed from triage: UA, pt unable to give sample at this time

## 2019-12-07 NOTE — MAU Note (Cosign Needed Addendum)
Chief Complaint:  Vaginal Discharge and Pelvic Pain   HPI: Brandi Henson is a 24 y.o. G2P1001 at [redacted]w[redacted]d who presents to maternity admissions reporting increased cramping and pressure + thick yellow vaginal discharge for 2-3 days.   Patient states that the pain is worse when she's up and moving around, but fine when she is supine. The pain is localized to the vaginal area and does not extend into the abdomen; she states that there is also pressure on the right side of the vaginal area at the same level as the pain. It has gotten worse over the last few days; she reports bending over when walking to minimize the pain and needing to be off her feet more.   Patient describes discharge as thick and white-yellow in underwear + increased when wiping; denies odor, itching, or bleeding. She states that she has had this discharge before, last time was 4-6 months ago. Last intercourse was 1 month ago.   She reports good fetal movement, denies LOF, vaginal bleeding, vaginal itching/burning, urinary symptoms, h/a, n/v, or fever/chills. She endorses irregular contractions.    Past Medical History: History reviewed. No pertinent past medical history.  Past obstetric history: OB History  Gravida Para Term Preterm AB Living  2 1 1     1   SAB TAB Ectopic Multiple Live Births        0 1    # Outcome Date GA Lbr Len/2nd Weight Sex Delivery Anes PTL Lv  2 Current           1 Term 08/18/16 [redacted]w[redacted]d 08:05 / 00:34 2866 g M Vag-Spont None  LIV    Past Surgical History: Past Surgical History:  Procedure Laterality Date   NO PAST SURGERIES      Family History: Family History  Problem Relation Age of Onset   Diabetes Maternal Grandfather    Diabetes Mother    Hypertension Mother    Healthy Father     Social History: Social History   Tobacco Use   Smoking status: Never Smoker   Smokeless tobacco: Never Used  Substance Use Topics   Alcohol use: Not Currently   Drug use: Not Currently     Frequency: 3.0 times per week    Types: Marijuana    Comment: pt states last in 2018    Allergies: No Known Allergies  Meds:  No medications prior to admission.    ROS:  Review of Systems  Constitutional: Positive for fatigue. Negative for fever.  HENT: Negative.   Eyes: Negative.   Respiratory: Negative for shortness of breath.   Cardiovascular: Negative for chest pain and palpitations.  Gastrointestinal: Positive for constipation. Negative for abdominal pain, nausea and vomiting.  Genitourinary: Positive for frequency, vaginal discharge and vaginal pain. Negative for difficulty urinating, dysuria, genital sores, hematuria and vaginal bleeding.  Musculoskeletal: Positive for back pain.  Neurological: Positive for light-headedness. Negative for headaches.   I have reviewed patient's Past Medical Hx, Surgical Hx, Family Hx, Social Hx, medications and allergies.   Physical Exam   Patient Vitals for the past 24 hrs:  BP Temp Temp src Pulse Resp SpO2  12/07/19 1632 125/75 -- -- 79 -- --  12/07/19 1454 106/67 98.6 F (37 C) Oral 88 16 100 %   Constitutional: Well-developed, well-nourished female in no acute distress.  Cardiovascular: normal rate Respiratory: normal effort GI: Abd soft, non-tender, gravid appropriate for gestational age.  MS: Extremities nontender, no edema, normal ROM Neurologic: Alert and oriented x 4.  GU: Neg CVAT. +Discharge.   PELVIC EXAM: Cervix pink, visually closed, without lesion, + white discharge, vaginal walls and external genitalia normal Bimanual exam: Cervix 0/long/high, firm, anterior, neg CMT, uterus nontender, nonenlarged, adnexa without tenderness, enlargement, or mass    FHT:  Baseline 130 bpm, moderate variability, accelerations present, no decelerations Contractions: irregular   Labs: Results for orders placed or performed during the hospital encounter of 12/07/19 (from the past 24 hour(s))  Urinalysis, Routine w reflex microscopic      Status: None   Collection Time: 12/07/19  2:57 PM  Result Value Ref Range   Color, Urine YELLOW YELLOW   APPearance CLEAR CLEAR   Specific Gravity, Urine 1.027 1.005 - 1.030   pH 6.0 5.0 - 8.0   Glucose, UA NEGATIVE NEGATIVE mg/dL   Hgb urine dipstick NEGATIVE NEGATIVE   Bilirubin Urine NEGATIVE NEGATIVE   Ketones, ur NEGATIVE NEGATIVE mg/dL   Protein, ur NEGATIVE NEGATIVE mg/dL   Nitrite NEGATIVE NEGATIVE   Leukocytes,Ua NEGATIVE NEGATIVE  Wet prep, genital     Status: Abnormal   Collection Time: 12/07/19  3:51 PM  Result Value Ref Range   Yeast Wet Prep HPF POC NONE SEEN NONE SEEN   Trich, Wet Prep NONE SEEN NONE SEEN   Clue Cells Wet Prep HPF POC NONE SEEN NONE SEEN   WBC, Wet Prep HPF POC MODERATE (A) NONE SEEN   Sperm NONE SEEN    O/Positive/-- (12/14 1040)  Imaging:  No results found.  MAU Course/MDM: Orders Placed This Encounter  Procedures   Wet prep, genital   Urinalysis, Routine w reflex microscopic   Discharge patient Discharge disposition: 01-Home or Self Care; Discharge patient date: 12/07/2019    No orders of the defined types were placed in this encounter.    Assessment: 1. Edema during pregnancy in third trimester     Right labial area swelling/edema most consistent with varicose veins or normal vaginal edema in pregnancy. Also evidence of varicose veins on posterior R knee area as well. No evidence of cervical dilation. Findings and symptoms consistent with varicose veins in late pregnancy.    Plan:  Discharge home Labor precautions and fetal kick counts    -------  Bayard Beaver, MS3 12/07/2019 4:36 PM   CNM attestation:  I have seen and examined this patient and agree with above documentation in the med student's note. I performed my own exam as documented below.   Brandi Henson is a 24 y.o. G2P1001 at [redacted]w[redacted]d reporting pelvic pain and pressure. +FM, denies LOF, VB, contractions; she reports vaginal discharge that is sometimes thick.    PE: Patient Vitals for the past 24 hrs:  BP Temp Temp src Pulse Resp SpO2  12/07/19 1632 125/75 -- -- 79 -- --  12/07/19 1454 106/67 98.6 F (37 C) Oral 88 16 100 %   Gen: calm comfortable, NAD Resp: normal effort, no distress Heart: Regular rate Abd: Soft, NT, gravid, S=D NEFG; right labial edema noted; unilateral. No masses, no tenderness, no obvious abscess. Concerns are batholins cytst vs. Abscess vs. Labial edema (benign).    FHR: Baseline 135, mod Variability, pos accels, no decels Toco: quiescent  ROS, labs, PMH reviewed  Orders Placed This Encounter  Procedures   Wet prep, genital   Urinalysis, Routine w reflex microscopic   Discharge patient Discharge disposition: 01-Home or Self Care; Discharge patient date: 12/07/2019   No orders of the defined types were placed in this encounter.   MDM   Assessment: 1.  Edema during pregnancy in third trimester     Plan: - Discharge home in stable condition - labor precautions and fetal kick counts. - Return to MAU if labial edema becomes more painful; develops redness, pus, fever. Patient knows that she may need to have labia drained if she develops any of these symptoms.  -Out of work note for two days given  -Follow-up as scheduled at your doctor's office for next prenatal visit or sooner as needed if symptoms worsen.  Starr Lake, [place] 12/07/2019 4:36 PM

## 2019-12-08 LAB — GC/CHLAMYDIA PROBE AMP (~~LOC~~) NOT AT ARMC
Chlamydia: NEGATIVE
Comment: NEGATIVE
Comment: NORMAL
Neisseria Gonorrhea: NEGATIVE

## 2019-12-11 ENCOUNTER — Encounter: Payer: Medicaid Other | Admitting: Obstetrics & Gynecology

## 2019-12-12 ENCOUNTER — Ambulatory Visit (INDEPENDENT_AMBULATORY_CARE_PROVIDER_SITE_OTHER): Payer: Medicaid Other | Admitting: Obstetrics and Gynecology

## 2019-12-12 ENCOUNTER — Other Ambulatory Visit: Payer: Self-pay

## 2019-12-12 ENCOUNTER — Encounter: Payer: Self-pay | Admitting: Obstetrics and Gynecology

## 2019-12-12 VITALS — BP 115/74 | HR 105 | Wt 168.0 lb

## 2019-12-12 DIAGNOSIS — O26879 Cervical shortening, unspecified trimester: Secondary | ICD-10-CM

## 2019-12-12 DIAGNOSIS — O0993 Supervision of high risk pregnancy, unspecified, third trimester: Secondary | ICD-10-CM

## 2019-12-12 NOTE — Progress Notes (Signed)
ROB   CC: None    

## 2019-12-12 NOTE — Progress Notes (Signed)
Subjective:  Brandi Henson is a 24 y.o. G2P1001 at [redacted]w[redacted]d being seen today for ongoing prenatal care.  She is currently monitored for the following issues for this high-risk pregnancy and has Supervision of high-risk pregnancy; Poor weight gain of pregnancy, first trimester; Back pain complicating pregnancy; and Short cervix affecting pregnancy on their problem list.  Patient reports general discomforts of pregnancy.  Contractions: Irritability. Vag. Bleeding: None.  Movement: Present. Denies leaking of fluid.   The following portions of the patient's history were reviewed and updated as appropriate: allergies, current medications, past family history, past medical history, past social history, past surgical history and problem list. Problem list updated.  Objective:   Vitals:   12/12/19 1454  BP: 115/74  Pulse: (!) 105  Weight: 168 lb (76.2 kg)    Fetal Status: Fetal Heart Rate (bpm): 140   Movement: Present     General:  Alert, oriented and cooperative. Patient is in no acute distress.  Skin: Skin is warm and dry. No rash noted.   Cardiovascular: Normal heart rate noted  Respiratory: Normal respiratory effort, no problems with respiration noted  Abdomen: Soft, gravid, appropriate for gestational age. Pain/Pressure: Present     Pelvic:  Cervical exam deferred        Extremities: Normal range of motion.  Edema: None  Mental Status: Normal mood and affect. Normal behavior. Normal judgment and thought content.   Urinalysis:      Assessment and Plan:  Pregnancy: G2P1001 at [redacted]w[redacted]d  1. Supervision of high risk pregnancy in third trimester Stable GBS next visit  2. Short cervix affecting pregnancy Did not restart progesterone. No S/Sx of PTL at present  Preterm labor symptoms and general obstetric precautions including but not limited to vaginal bleeding, contractions, leaking of fluid and fetal movement were reviewed in detail with the patient. Please refer to After Visit Summary  for other counseling recommendations.  Return in about 1 week (around 12/19/2019) for face to face, MD only, for GBS.   Hermina Staggers, MD

## 2019-12-12 NOTE — Patient Instructions (Signed)
Third Trimester of Pregnancy The third trimester is from week 28 through week 40 (months 7 through 9). The third trimester is a time when the unborn baby (fetus) is growing rapidly. At the end of the ninth month, the fetus is about 20 inches in length and weighs 6-10 pounds. Body changes during your third trimester Your body will continue to go through many changes during pregnancy. The changes vary from woman to woman. During the third trimester:  Your weight will continue to increase. You can expect to gain 25-35 pounds (11-16 kg) by the end of the pregnancy.  You may begin to get stretch marks on your hips, abdomen, and breasts.  You may urinate more often because the fetus is moving lower into your pelvis and pressing on your bladder.  You may develop or continue to have heartburn. This is caused by increased hormones that slow down muscles in the digestive tract.  You may develop or continue to have constipation because increased hormones slow digestion and cause the muscles that push waste through your intestines to relax.  You may develop hemorrhoids. These are swollen veins (varicose veins) in the rectum that can itch or be painful.  You may develop swollen, bulging veins (varicose veins) in your legs.  You may have increased body aches in the pelvis, back, or thighs. This is due to weight gain and increased hormones that are relaxing your joints.  You may have changes in your hair. These can include thickening of your hair, rapid growth, and changes in texture. Some women also have hair loss during or after pregnancy, or hair that feels dry or thin. Your hair will most likely return to normal after your baby is born.  Your breasts will continue to grow and they will continue to become tender. A yellow fluid (colostrum) may leak from your breasts. This is the first milk you are producing for your baby.  Your belly button may stick out.  You may notice more swelling in your hands,  face, or ankles.  You may have increased tingling or numbness in your hands, arms, and legs. The skin on your belly may also feel numb.  You may feel short of breath because of your expanding uterus.  You may have more problems sleeping. This can be caused by the size of your belly, increased need to urinate, and an increase in your body's metabolism.  You may notice the fetus "dropping," or moving lower in your abdomen (lightening).  You may have increased vaginal discharge.  You may notice your joints feel loose and you may have pain around your pelvic bone. What to expect at prenatal visits You will have prenatal exams every 2 weeks until week 36. Then you will have weekly prenatal exams. During a routine prenatal visit:  You will be weighed to make sure you and the baby are growing normally.  Your blood pressure will be taken.  Your abdomen will be measured to track your baby's growth.  The fetal heartbeat will be listened to.  Any test results from the previous visit will be discussed.  You may have a cervical check near your due date to see if your cervix has softened or thinned (effaced).  You will be tested for Group B streptococcus. This happens between 35 and 37 weeks. Your health care provider may ask you:  What your birth plan is.  How you are feeling.  If you are feeling the baby move.  If you have had any abnormal   symptoms, such as leaking fluid, bleeding, severe headaches, or abdominal cramping.  If you are using any tobacco products, including cigarettes, chewing tobacco, and electronic cigarettes.  If you have any questions. Other tests or screenings that may be performed during your third trimester include:  Blood tests that check for low iron levels (anemia).  Fetal testing to check the health, activity level, and growth of the fetus. Testing is done if you have certain medical conditions or if there are problems during the pregnancy.  Nonstress test  (NST). This test checks the health of your baby to make sure there are no signs of problems, such as the baby not getting enough oxygen. During this test, a belt is placed around your belly. The baby is made to move, and its heart rate is monitored during movement. What is false labor? False labor is a condition in which you feel small, irregular tightenings of the muscles in the womb (contractions) that usually go away with rest, changing position, or drinking water. These are called Braxton Hicks contractions. Contractions may last for hours, days, or even weeks before true labor sets in. If contractions come at regular intervals, become more frequent, increase in intensity, or become painful, you should see your health care provider. What are the signs of labor?  Abdominal cramps.  Regular contractions that start at 10 minutes apart and become stronger and more frequent with time.  Contractions that start on the top of the uterus and spread down to the lower abdomen and back.  Increased pelvic pressure and dull back pain.  A watery or bloody mucus discharge that comes from the vagina.  Leaking of amniotic fluid. This is also known as your "water breaking." It could be a slow trickle or a gush. Let your health care provider know if it has a color or strange odor. If you have any of these signs, call your health care provider right away, even if it is before your due date. Follow these instructions at home: Medicines  Follow your health care provider's instructions regarding medicine use. Specific medicines may be either safe or unsafe to take during pregnancy.  Take a prenatal vitamin that contains at least 600 micrograms (mcg) of folic acid.  If you develop constipation, try taking a stool softener if your health care provider approves. Eating and drinking   Eat a balanced diet that includes fresh fruits and vegetables, whole grains, good sources of protein such as meat, eggs, or tofu,  and low-fat dairy. Your health care provider will help you determine the amount of weight gain that is right for you.  Avoid raw meat and uncooked cheese. These carry germs that can cause birth defects in the baby.  If you have low calcium intake from food, talk to your health care provider about whether you should take a daily calcium supplement.  Eat four or five small meals rather than three large meals a day.  Limit foods that are high in fat and processed sugars, such as fried and sweet foods.  To prevent constipation: ? Drink enough fluid to keep your urine clear or pale yellow. ? Eat foods that are high in fiber, such as fresh fruits and vegetables, whole grains, and beans. Activity  Exercise only as directed by your health care provider. Most women can continue their usual exercise routine during pregnancy. Try to exercise for 30 minutes at least 5 days a week. Stop exercising if you experience uterine contractions.  Avoid heavy lifting.  Do   not exercise in extreme heat or humidity, or at high altitudes.  Wear low-heel, comfortable shoes.  Practice good posture.  You may continue to have sex unless your health care provider tells you otherwise. Relieving pain and discomfort  Take frequent breaks and rest with your legs elevated if you have leg cramps or low back pain.  Take warm sitz baths to soothe any pain or discomfort caused by hemorrhoids. Use hemorrhoid cream if your health care provider approves.  Wear a good support bra to prevent discomfort from breast tenderness.  If you develop varicose veins: ? Wear support pantyhose or compression stockings as told by your healthcare provider. ? Elevate your feet for 15 minutes, 3-4 times a day. Prenatal care  Write down your questions. Take them to your prenatal visits.  Keep all your prenatal visits as told by your health care provider. This is important. Safety  Wear your seat belt at all times when driving.  Make  a list of emergency phone numbers, including numbers for family, friends, the hospital, and police and fire departments. General instructions  Avoid cat litter boxes and soil used by cats. These carry germs that can cause birth defects in the baby. If you have a cat, ask someone to clean the litter box for you.  Do not travel far distances unless it is absolutely necessary and only with the approval of your health care provider.  Do not use hot tubs, steam rooms, or saunas.  Do not drink alcohol.  Do not use any products that contain nicotine or tobacco, such as cigarettes and e-cigarettes. If you need help quitting, ask your health care provider.  Do not use any medicinal herbs or unprescribed drugs. These chemicals affect the formation and growth of the baby.  Do not douche or use tampons or scented sanitary pads.  Do not cross your legs for long periods of time.  To prepare for the arrival of your baby: ? Take prenatal classes to understand, practice, and ask questions about labor and delivery. ? Make a trial run to the hospital. ? Visit the hospital and tour the maternity area. ? Arrange for maternity or paternity leave through employers. ? Arrange for family and friends to take care of pets while you are in the hospital. ? Purchase a rear-facing car seat and make sure you know how to install it in your car. ? Pack your hospital bag. ? Prepare the baby's nursery. Make sure to remove all pillows and stuffed animals from the baby's crib to prevent suffocation.  Visit your dentist if you have not gone during your pregnancy. Use a soft toothbrush to brush your teeth and be gentle when you floss. Contact a health care provider if:  You are unsure if you are in labor or if your water has broken.  You become dizzy.  You have mild pelvic cramps, pelvic pressure, or nagging pain in your abdominal area.  You have lower back pain.  You have persistent nausea, vomiting, or  diarrhea.  You have an unusual or bad smelling vaginal discharge.  You have pain when you urinate. Get help right away if:  Your water breaks before 37 weeks.  You have regular contractions less than 5 minutes apart before 37 weeks.  You have a fever.  You are leaking fluid from your vagina.  You have spotting or bleeding from your vagina.  You have severe abdominal pain or cramping.  You have rapid weight loss or weight gain.  You have   shortness of breath with chest pain.  You notice sudden or extreme swelling of your face, hands, ankles, feet, or legs.  Your baby makes fewer than 10 movements in 2 hours.  You have severe headaches that do not go away when you take medicine.  You have vision changes. Summary  The third trimester is from week 28 through week 40, months 7 through 9. The third trimester is a time when the unborn baby (fetus) is growing rapidly.  During the third trimester, your discomfort may increase as you and your baby continue to gain weight. You may have abdominal, leg, and back pain, sleeping problems, and an increased need to urinate.  During the third trimester your breasts will keep growing and they will continue to become tender. A yellow fluid (colostrum) may leak from your breasts. This is the first milk you are producing for your baby.  False labor is a condition in which you feel small, irregular tightenings of the muscles in the womb (contractions) that eventually go away. These are called Braxton Hicks contractions. Contractions may last for hours, days, or even weeks before true labor sets in.  Signs of labor can include: abdominal cramps; regular contractions that start at 10 minutes apart and become stronger and more frequent with time; watery or bloody mucus discharge that comes from the vagina; increased pelvic pressure and dull back pain; and leaking of amniotic fluid. This information is not intended to replace advice given to you by your  health care provider. Make sure you discuss any questions you have with your health care provider. Document Revised: 11/03/2018 Document Reviewed: 08/18/2016 Elsevier Patient Education  2020 [employer]  

## 2019-12-19 ENCOUNTER — Other Ambulatory Visit (HOSPITAL_COMMUNITY)
Admission: RE | Admit: 2019-12-19 | Discharge: 2019-12-19 | Disposition: A | Discharge status: Home / Self Care | Payer: Medicaid Other | Source: Ambulatory Visit | Attending: Obstetrics & Gynecology | Admitting: Obstetrics & Gynecology

## 2019-12-19 ENCOUNTER — Other Ambulatory Visit: Payer: Self-pay

## 2019-12-19 ENCOUNTER — Ambulatory Visit (INDEPENDENT_AMBULATORY_CARE_PROVIDER_SITE_OTHER): Payer: Medicaid Other | Admitting: Obstetrics & Gynecology

## 2019-12-19 DIAGNOSIS — O0993 Supervision of high risk pregnancy, unspecified, third trimester: Secondary | ICD-10-CM

## 2019-12-19 DIAGNOSIS — Z3A36 36 weeks gestation of pregnancy: Secondary | ICD-10-CM

## 2019-12-19 DIAGNOSIS — O3443 Maternal care for other abnormalities of cervix, third trimester: Secondary | ICD-10-CM

## 2019-12-19 MED ORDER — METFORMIN HCL 500 MG PO TABS: 500.0000 mg | ORAL_TABLET | Freq: Every evening | ORAL | 11 refills | Status: DC

## 2019-12-19 NOTE — Progress Notes (Signed)
Patient reports fetal movement with irregular contractions. 

## 2019-12-19 NOTE — Progress Notes (Signed)
Subjective:    Brandi Henson is a 24 y.o. G2P1001 [redacted]w[redacted]d being seen today for her obstetrical visit.  Patient reports no complaints. Fetal movement: normal.  Objective:    BP 127/80   Pulse (!) 102   Wt 78.2 kg   LMP 04/06/2019 (Exact Date)   BMI 24.72 kg/m   Physical Exam  Vitals reviewed. Constitutional: She is oriented to person, place, and time. She appears well-developed and well-nourished.  HENT:  Head: Normocephalic and atraumatic.  Eyes: Pupils are equal, round, and reactive to light.  Cardiovascular: Normal rate.  Respiratory: Effort normal.  GI: Soft. She exhibits no distension. There is no abdominal tenderness. There is no guarding.  Genitourinary:    Vulva and vagina normal.     No vaginal discharge.   Musculoskeletal:        General: No edema.     Cervical back: Normal range of motion.  Neurological: She is alert and oriented to person, place, and time.  Skin: Skin is warm and dry.  Psychiatric: She has a normal mood and affect. Her behavior is normal.    Maternal Exam:  Abdomen: Patient reports no abdominal tenderness. Fetal presentation: vertex  Introitus: Normal vulva. Normal vagina.  Vagina is negative for discharge.  Pelvis: adequate for delivery.       FHT: Fetal Heart Rate (bpm): 135  Uterine Size:  36 cm  Presentation:  Vertex     Assessment:    Pregnancy:  G2P1001 at 36.5wks presents for prenatal visit.     Plan:    Patient Active Problem List   Diagnosis Date Noted  . Short cervix affecting pregnancy 08/28/2019  . Poor weight gain of pregnancy, first trimester 07/10/2019  . Back pain complicating pregnancy 07/10/2019  . Supervision of high-risk pregnancy 06/16/2019   Follow up in 1 Week.

## 2019-12-19 NOTE — Patient Instructions (Signed)
Gestational Diabetes Mellitus, Diagnosis Gestational diabetes (gestational diabetes mellitus) is a temporary form of diabetes that some women develop during pregnancy. It usually occurs around weeks 24-28 of pregnancy, and it goes away after delivery. Hormonal changes during pregnancy can interfere with insulin production and function, which may result in one or both of these problems:  The pancreas does not make enough of a hormone called insulin.  Cells in the body do not respond properly to insulin that the body makes (insulin resistance). Normally, insulin allows blood sugar (glucose) to enter cells in the body. The cells use glucose for energy. Insulin resistance or lack of insulin causes excess glucose to build up in the blood instead of going into cells. As a result, high blood glucose (hyperglycemia) develops. If gestational diabetes is treated, it is not likely to cause problems. If it is not controlled with treatment, it may cause problems during labor and delivery, and some of those problems can be harmful to the unborn baby (fetus) and the mother. Women who get gestational diabetes are more likely to develop it if they get pregnant again, and they are more likely to develop type 2 diabetes in the future. What increases the risk? This condition may be more likely to develop in pregnant women who:  Are older than age 25 during pregnancy.  Have a family history of diabetes.  Are overweight.  Had gestational diabetes in the past.  Have polycystic ovary syndrome (PCOS).  Are pregnant with twins or multiples.  Are of American-Indian, African-American, Hispanic/Latino, or Asian/Pacific Islander descent. What are the signs or symptoms? Most women do not notice symptoms of gestational diabetes because the symptoms are similar to normal symptoms of pregnancy. Symptoms of gestational diabetes may include:  Increased thirst (polydipsia).  Increased hunger(polyphagia).  Increased  urination (polyuria). How is this diagnosed? This condition may be diagnosed based on your blood glucose level, which may be checked with one or more of the following blood tests:  A fasting blood glucose (FBG) test. You will not be allowed to eat (you will fast) for 8 hours or longer before a blood sample is taken.  A random blood glucose test. This checks your blood glucose at any time of day regardless of when you ate.  An oral glucose tolerance test (OGTT). This is usually done during weeks 24-28 of pregnancy. ? For this test, you will have an FBG test done. Then, you will drink a beverage that contains glucose. Your blood glucose will be tested again one hour after you drink the glucose beverage (1-hour OGTT). ? If the 1-hour OGTT result is at or above 140 mg/dL (7.8 mmol/L), you will repeat the OGTT. This time, your blood glucose will be tested 3 hours after you drink the glucose beverage (3-hour OGTT). If you have risk factors, you may be screened for undiagnosed type 2 diabetes at your first health care visit during your pregnancy (prenatal visit). How is this treated?     Your treatment may be managed by a specialist called an endocrinologist. This condition is treated by following instructions from your health care provider about:  Eating a healthy diet and getting more physical activity. These changes are the most important ways to manage gestational diabetes.  Checking your blood glucose. Do this as often as told.  Taking diabetes medicines or insulin every day. These will only be prescribed if they are needed. ? If you use insulin, you may need to adjust your dosage based on how physically active   you are and what foods you eat. Your health care provider will tell you how to do this. Your health care provider will set treatment goals for you based on the stage of your pregnancy and any other medical conditions you have. Generally, the goal of treatment is to maintain the following  blood glucose levels during pregnancy:  Before meals (preprandial): at or below 95 mg/dL (5.3 mmol/L).  After meals (postprandial): ? One hour after a meal: at or below 140 mg/dL (7.8 mmol/L). ? Two hours after a meal: at or below 120 mg/dL (6.7 mmol/L).  A1c (hemoglobin A1c) level: 6-6.5%. Follow these instructions at home: Questions to ask your health care provider  Consider asking the following questions: ? Do I need to meet with a diabetes educator? ? What equipment will I need to manage my diabetes at home? ? What diabetes medicines do I need, and when should I take them? ? How often do I need to check my blood glucose? ? What number can I call if I have questions? ? When is my next appointment? General instructions  Take over-the-counter and prescription medicines only as told by your health care provider.  Manage your weight gain during pregnancy. The amount of weight that you are expected to gain depends on your pre-pregnancy BMI (body mass index).  Keep all follow-up visits as told by your health care provider. This is important.  For more information about diabetes, visit: ? American Diabetes Association (ADA): www.diabetes.org ? American Association of Diabetes Educators (AADE): www.diabeteseducator.org Contact a health care provider if:  Your blood glucose level is at or above 240 mg/dL (13.3 mmol/L).  Your blood glucose level is at or above 200 mg/dL (11.1 mmol/L) and you have ketones in your urine.  You have been sick or have had a fever for 2 days or longer and you are not getting better.  You have any of the following problems for more than 6 hours: ? You cannot eat or drink. ? You have nausea and vomiting. ? You have diarrhea. Get help right away if:  Your blood glucose is lower than 54 mg/dL (3 mmol/L).  You become confused or you have trouble thinking clearly.  You have difficulty breathing.  You have moderate or large ketone levels in your  urine.  Your baby is moving around less than usual.  You develop unusual discharge or bleeding from your vagina.  You start having contractions early (prematurely). Contractions may feel like a tightening in your lower abdomen. Summary  Gestational diabetes (gestational diabetes mellitus) is a temporary form of diabetes that some women develop during pregnancy. It usually occurs around weeks 24-28 of pregnancy, and it goes away after delivery.  This condition is treated by making diet and lifestyle changes and taking diabetes medicines or insulin, if needed.  Women who get gestational diabetes are more likely to develop it if they get pregnant again, and they are more likely to develop type 2 diabetes in the future. This information is not intended to replace advice given to you by your health care provider. Make sure you discuss any questions you have with your health care provider. Document Revised: 08/19/2017 Document Reviewed: 08/16/2015 Elsevier Patient Education  2020 Else[employer] Inc.  

## 2019-12-21 LAB — CERVICOVAGINAL ANCILLARY ONLY
Chlamydia: NEGATIVE
Comment: NEGATIVE
Comment: NORMAL
Neisseria Gonorrhea: NEGATIVE

## 2019-12-21 LAB — STREP GP B NAA: Strep Gp B NAA: NEGATIVE

## 2019-12-26 ENCOUNTER — Encounter: Payer: Medicaid Other | Admitting: Obstetrics & Gynecology

## 2019-12-28 ENCOUNTER — Other Ambulatory Visit: Payer: Self-pay

## 2019-12-28 ENCOUNTER — Encounter (HOSPITAL_COMMUNITY): Payer: Self-pay | Admitting: Obstetrics & Gynecology

## 2019-12-28 ENCOUNTER — Inpatient Hospital Stay (HOSPITAL_COMMUNITY)
Admission: AD | Admit: 2019-12-28 | Discharge: 2019-12-30 | DRG: 807 | Disposition: A | Discharge status: Home / Self Care | Payer: 59 | Attending: Obstetrics & Gynecology | Admitting: Obstetrics & Gynecology

## 2019-12-28 ENCOUNTER — Ambulatory Visit (INDEPENDENT_AMBULATORY_CARE_PROVIDER_SITE_OTHER): Payer: Medicaid Other | Admitting: Obstetrics and Gynecology

## 2019-12-28 ENCOUNTER — Encounter: Payer: Self-pay | Admitting: Obstetrics and Gynecology

## 2019-12-28 VITALS — BP 117/75 | HR 81 | Wt 173.1 lb

## 2019-12-28 DIAGNOSIS — Z3A38 38 weeks gestation of pregnancy: Secondary | ICD-10-CM

## 2019-12-28 DIAGNOSIS — O0993 Supervision of high risk pregnancy, unspecified, third trimester: Secondary | ICD-10-CM

## 2019-12-28 DIAGNOSIS — O26873 Cervical shortening, third trimester: Principal | ICD-10-CM | POA: Diagnosis present

## 2019-12-28 DIAGNOSIS — O26893 Other specified pregnancy related conditions, third trimester: Secondary | ICD-10-CM | POA: Diagnosis present

## 2019-12-28 DIAGNOSIS — O26879 Cervical shortening, unspecified trimester: Secondary | ICD-10-CM

## 2019-12-28 DIAGNOSIS — O0990 Supervision of high risk pregnancy, unspecified, unspecified trimester: Secondary | ICD-10-CM

## 2019-12-28 DIAGNOSIS — Z20822 Contact with and (suspected) exposure to covid-19: Secondary | ICD-10-CM | POA: Diagnosis present

## 2019-12-28 DIAGNOSIS — N883 Incompetence of cervix uteri: Secondary | ICD-10-CM | POA: Diagnosis present

## 2019-12-28 LAB — CBC
HCT: 32.7 % — ABNORMAL LOW (ref 36.0–46.0)
Hemoglobin: 10.6 g/dL — ABNORMAL LOW (ref 12.0–15.0)
MCH: 28.8 pg (ref 26.0–34.0)
MCHC: 32.4 g/dL (ref 30.0–36.0)
MCV: 88.9 fL (ref 80.0–100.0)
Platelets: 230 10*3/uL (ref 150–400)
RBC: 3.68 MIL/uL — ABNORMAL LOW (ref 3.87–5.11)
RDW: 14.1 % (ref 11.5–15.5)
WBC: 9.7 10*3/uL (ref 4.0–10.5)
nRBC: 0 % (ref 0.0–0.2)

## 2019-12-28 LAB — ABO/RH: ABO/RH(D): O POS

## 2019-12-28 LAB — SARS CORONAVIRUS 2 BY RT PCR (HOSPITAL ORDER, PERFORMED IN ~~LOC~~ HOSPITAL LAB): SARS Coronavirus 2: NEGATIVE

## 2019-12-28 LAB — TYPE AND SCREEN
ABO/RH(D): O POS
Antibody Screen: NEGATIVE

## 2019-12-28 MED ORDER — MEDROXYPROGESTERONE ACETATE 150 MG/ML IM SUSP
150.0000 mg | Freq: Once | INTRAMUSCULAR | Status: AC
  Administered 2019-12-29: 150 mg via INTRAMUSCULAR
  Filled 2019-12-28: qty 1

## 2019-12-28 MED ORDER — DIBUCAINE (PERIANAL) 1 % EX OINT: 1.0000 "application " | TOPICAL_OINTMENT | CUTANEOUS | Status: DC | PRN

## 2019-12-28 MED ORDER — PRENATAL MULTIVITAMIN CH
1.0000 | ORAL_TABLET | Freq: Every day | ORAL | Status: DC
  Administered 2019-12-29: 1 via ORAL
  Filled 2019-12-28: qty 1

## 2019-12-28 MED ORDER — LACTATED RINGERS IV SOLN: 500.0000 mL | INTRAVENOUS | Status: DC | PRN

## 2019-12-28 MED ORDER — ONDANSETRON HCL 4 MG/2ML IJ SOLN: 4.0000 mg | Freq: Four times a day (QID) | INTRAMUSCULAR | Status: DC | PRN

## 2019-12-28 MED ORDER — ONDANSETRON HCL 4 MG/2ML IJ SOLN: 4.0000 mg | INTRAMUSCULAR | Status: DC | PRN

## 2019-12-28 MED ORDER — SIMETHICONE 80 MG PO CHEW: 80.0000 mg | CHEWABLE_TABLET | ORAL | Status: DC | PRN

## 2019-12-28 MED ORDER — IBUPROFEN 600 MG PO TABS
600.0000 mg | ORAL_TABLET | Freq: Three times a day (TID) | ORAL | Status: DC | PRN
  Administered 2019-12-29 (×3): 600 mg via ORAL
  Filled 2019-12-28 (×2): qty 1

## 2019-12-28 MED ORDER — LIDOCAINE HCL (PF) 1 % IJ SOLN
30.0000 mL | INTRAMUSCULAR | Status: AC | PRN
  Administered 2019-12-28: 30 mL via SUBCUTANEOUS

## 2019-12-28 MED ORDER — WITCH HAZEL-GLYCERIN EX PADS: 1.0000 "application " | MEDICATED_PAD | CUTANEOUS | Status: DC | PRN

## 2019-12-28 MED ORDER — LIDOCAINE HCL (PF) 1 % IJ SOLN
INTRAMUSCULAR | Status: AC
  Filled 2019-12-28: qty 30

## 2019-12-28 MED ORDER — COCONUT OIL OIL: 1.0000 "application " | TOPICAL_OIL | Status: DC | PRN

## 2019-12-28 MED ORDER — OXYTOCIN BOLUS FROM INFUSION
500.0000 mL | Freq: Once | INTRAVENOUS | Status: AC
  Administered 2019-12-28: 500 mL via INTRAVENOUS

## 2019-12-28 MED ORDER — SENNOSIDES-DOCUSATE SODIUM 8.6-50 MG PO TABS
2.0000 | ORAL_TABLET | ORAL | Status: DC
  Administered 2019-12-29 (×2): 2 via ORAL
  Filled 2019-12-28 (×2): qty 2

## 2019-12-28 MED ORDER — OXYCODONE-ACETAMINOPHEN 5-325 MG PO TABS
1.0000 | ORAL_TABLET | Freq: Once | ORAL | Status: AC | PRN
  Administered 2019-12-28: 1 via ORAL
  Filled 2019-12-28: qty 1

## 2019-12-28 MED ORDER — ONDANSETRON HCL 4 MG PO TABS: 4.0000 mg | ORAL_TABLET | ORAL | Status: DC | PRN

## 2019-12-28 MED ORDER — ACETAMINOPHEN 325 MG PO TABS: 650.0000 mg | ORAL_TABLET | ORAL | Status: DC | PRN

## 2019-12-28 MED ORDER — BENZOCAINE-MENTHOL 20-0.5 % EX AERO
1.0000 "application " | INHALATION_SPRAY | CUTANEOUS | Status: DC | PRN
  Administered 2019-12-28: 1 via TOPICAL
  Filled 2019-12-28: qty 56

## 2019-12-28 MED ORDER — SOD CITRATE-CITRIC ACID 500-334 MG/5ML PO SOLN: 30.0000 mL | ORAL | Status: DC | PRN

## 2019-12-28 MED ORDER — LACTATED RINGERS IV SOLN
INTRAVENOUS | Status: DC
  Administered 2019-12-28: 125 mL via INTRAVENOUS

## 2019-12-28 MED ORDER — DIPHENHYDRAMINE HCL 25 MG PO CAPS: 25.0000 mg | ORAL_CAPSULE | Freq: Four times a day (QID) | ORAL | Status: DC | PRN

## 2019-12-28 MED ORDER — ACETAMINOPHEN 325 MG PO TABS
650.0000 mg | ORAL_TABLET | Freq: Four times a day (QID) | ORAL | Status: DC | PRN
  Administered 2019-12-29: 650 mg via ORAL
  Filled 2019-12-28: qty 2

## 2019-12-28 MED ORDER — TETANUS-DIPHTH-ACELL PERTUSSIS 5-2.5-18.5 LF-MCG/0.5 IM SUSP: 0.5000 mL | Freq: Once | INTRAMUSCULAR | Status: DC

## 2019-12-28 MED ORDER — OXYTOCIN-SODIUM CHLORIDE 30-0.9 UT/500ML-% IV SOLN
2.5000 [IU]/h | INTRAVENOUS | Status: DC
  Administered 2019-12-28: 2.5 [IU]/h via INTRAVENOUS
  Filled 2019-12-28: qty 500

## 2019-12-28 MED ORDER — MEASLES, MUMPS & RUBELLA VAC IJ SOLR: 0.5000 mL | Freq: Once | INTRAMUSCULAR | Status: DC

## 2019-12-28 NOTE — Progress Notes (Signed)
Pt is here for ROB, [redacted]w[redacted]d.  

## 2019-12-28 NOTE — Progress Notes (Signed)
Patient ID: Brandi Henson, female   DOB: January 26, 1996, 24 y.o.   MRN: [id number]  Brandi Henson is a 24 y.o. G2P1001 at [redacted]w[redacted]d admitted for SOL.  Subjective: Feeling contractions, but tolerating pain well.  No complaints.  Discussed AROM and she is agreeable to this.    Objective: BP 125/78    Pulse 78    Temp 98 F (36.7 C) (Oral)    Resp 16    Ht 5\' 10"  (1.778 m)    Wt 78 kg    LMP 04/06/2019 (Exact Date)    BMI 24.68 kg/m  No intake/output data recorded.  FHT:  FHR: 130 bpm, variability: moderate,  accelerations:  Present,  decelerations:  Absent UC:   regular, every 1-2 minutes  SVE:   Dilation: 8 Effacement (%): 90 Station: -1  Labs: Lab Results  Component Value Date   WBC 9.7 12/28/2019   HGB 10.6 (L) 12/28/2019   HCT 32.7 (L) 12/28/2019   MCV 88.9 12/28/2019   PLT 230 12/28/2019    Assessment / Plan: Spontaneous labor, progressing normally  Labor: Progressing normally, AROM at 1838 Fetal Wellbeing:  Category I Pain Control:  Labor support without medications I/D:  GBS negative Anticipated MOD:  SVD  Phynix Horton 02/27/2020, MD PGY-2 Resident Family Medicine 12/28/2019, 6:52 PM

## 2019-12-28 NOTE — Discharge Summary (Signed)
Postpartum Discharge Summary    Patient Name: Brandi Henson DOB: February 23, 1996 MRN: [id number]  Date of admission: 12/28/2019 Delivery date:12/28/2019  Delivering provider: Chauncey Mann  Date of discharge: 12/30/2019  Admitting diagnosis: Labor and delivery, indication for care [O75.9] Intrauterine pregnancy: [redacted]w[redacted]d    Secondary diagnosis:  Active Problems:   Supervision of high-risk pregnancy   Short cervix affecting pregnancy   Labor and delivery indication for care or intervention  Additional problems: None    Discharge diagnosis: Term Pregnancy Delivered                                              Post partum procedures:None Augmentation: AROM Complications: None  Hospital course: Onset of Labor With Vaginal Delivery      24y.o. yo G2P1001 at 326w0das admitted in Active Labor on 12/28/2019. Patient had an uncomplicated labor course as follows: Initial SVE: 6.5/80/-1. AROM performed. She then progressed to complete. Membrane Rupture Time/Date: 6:38 PM ,12/28/2019   Delivery Method:Vaginal, Spontaneous  Episiotomy: None  Lacerations:  2nd degree  Patient had an uncomplicated postpartum course. She was given a Depo shot prior to discharge. She is ambulating, tolerating a regular diet, passing flatus, and urinating well. Patient is discharged home in stable condition on 12/30/19.  Newborn Data: Birth date:12/28/2019  Birth time:7:09 PM  Gender:Female  Living status:Living  Apgars:9 ,9  Weight:3084 g   Magnesium Sulfate received: No BMZ received: No Rhophylac:No MMR:No T-DaP:declined Flu: No Transfusion:No  Physical exam  Vitals:   12/29/19 1000 12/29/19 1657 12/29/19 2210 12/30/19 0414  BP: 120/76 128/71 114/68 112/68  Pulse: 87 80 95 73  Resp: '18 18 18 18  ' Temp: 98.4 F (36.9 C) 98.5 F (36.9 C) 98.2 F (36.8 C) 98.2 F (36.8 C)  TempSrc: Oral Oral Oral Oral  SpO2: 99%  100% 100%  Weight:      Height:       General: alert, cooperative and no distress Lochia:  appropriate Uterine Fundus: firm Incision: N/A DVT Evaluation: No evidence of DVT seen on physical exam. No significant calf/ankle edema. Labs: Lab Results  Component Value Date   WBC 9.7 12/28/2019   HGB 10.6 (L) 12/28/2019   HCT 32.7 (L) 12/28/2019   MCV 88.9 12/28/2019   PLT 230 12/28/2019   CMP Latest Ref Rng & Units 07/26/2019  Glucose 70 - 99 mg/dL 78  BUN 6 - 20 mg/dL 6  Creatinine 0.44 - 1.00 mg/dL 0.60  Sodium 135 - 145 mmol/L 136  Potassium 3.5 - 5.1 mmol/L 4.3  Chloride 98 - 111 mmol/L 107  CO2 22 - 32 mmol/L 22  Calcium 8.9 - 10.3 mg/dL 8.7(L)  Total Protein 6.5 - 8.1 g/dL 6.1(L)  Total Bilirubin 0.3 - 1.2 mg/dL 0.9  Alkaline Phos 38 - 126 U/L 49  AST 15 - 41 U/L 13(L)  ALT 0 - 44 U/L 11   Edinburgh Score: No flowsheet data found.   After visit meds:  Allergies as of 12/30/2019   No Known Allergies     Medication List    STOP taking these medications   ASPIRIN 81 PO     TAKE these medications   acetaminophen 325 MG tablet Commonly known as: Tylenol Take 2 tablets (650 mg total) by mouth every 6 (six) hours as needed (for pain scale < 4).   ibuprofen 600 MG  tablet Commonly known as: ADVIL Take 1 tablet (600 mg total) by mouth every 8 (eight) hours as needed for mild pain.   PNV Prenatal Plus Multivitamin 27-1 MG Tabs Take 1 tablet by mouth daily.   polyethylene glycol powder 17 GM/SCOOP powder Commonly known as: GLYCOLAX/MIRALAX Take 17 g by mouth daily as needed.        Discharge home in stable condition Infant Feeding: Bottle and Breast Infant Disposition:home with mother Discharge instruction: per After Visit Summary and Postpartum booklet. Activity: Advance as tolerated. Pelvic rest for 6 weeks.  Diet: routine diet Future Appointments: Future Appointments  Date Time Provider [place]  01/26/2020  9:30 AM Constant, Vickii Chafe, MD [place] None   Follow up Visit:   Please schedule this patient for a In-Person postpartum visit  in 4 weeks with the following provider: Any provider. Additional Postpartum F/U: IUD insertion Low risk pregnancy complicated by: none Delivery mode:  Vaginal, Spontaneous  Anticipated Birth Control:  Depo   12/30/2019 Clarnce Flock, MD

## 2019-12-28 NOTE — Patient Instructions (Signed)

## 2019-12-28 NOTE — Progress Notes (Signed)
Subjective:  Brandi Henson is a 24 y.o. G2P1001 at [redacted]w[redacted]d being seen today for ongoing prenatal care.  She is currently monitored for the following issues for this low-risk pregnancy and has Supervision of high-risk pregnancy; Poor weight gain of pregnancy, first trimester; Back pain complicating pregnancy; and Short cervix affecting pregnancy on their problem list.  Patient reports ut ctx since last night.  Contractions: Irregular. Vag. Bleeding: None.  Movement: Present. Denies leaking of fluid.   The following portions of the patient's history were reviewed and updated as appropriate: allergies, current medications, past family history, past medical history, past social history, past surgical history and problem list. Problem list updated.  Objective:   Vitals:   12/28/19 1406  BP: 117/75  Pulse: 81  Weight: 173 lb 1.6 oz (78.5 kg)    Fetal Status:     Movement: Present     General:  Alert, oriented and cooperative. Patient is in no acute distress.  Skin: Skin is warm and dry. No rash noted.   Cardiovascular: Normal heart rate noted  Respiratory: Normal respiratory effort, no problems with respiration noted  Abdomen: Soft, gravid, appropriate for gestational age. Pain/Pressure: Present     Pelvic:  Cervical exam performed        Extremities: Normal range of motion.  Edema: None  Mental Status: Normal mood and affect. Normal behavior. Normal judgment and thought content.   Urinalysis:      Assessment and Plan:  Pregnancy: G2P1001 at [redacted]w[redacted]d  1. Supervision of high risk pregnancy in third trimester In Labor to [facility] for admission  2. Short cervix affecting pregnancy   Term labor symptoms and general obstetric precautions including but not limited to vaginal bleeding, contractions, leaking of fluid and fetal movement were reviewed in detail with the patient. Please refer to After Visit Summary for other counseling recommendations.  Return in about 4 weeks (around 01/25/2020) for  face to face, any provider for PP visit.   Hermina Staggers, MD

## 2019-12-28 NOTE — H&P (Signed)
OBSTETRIC ADMISSION HISTORY AND PHYSICAL  Lace Chenevert is a 24 y.o. female G2P1001 with IUP at [redacted]w[redacted]d by LMP c/w 8 wk [place] presenting for SOL. She reports +FMs, No LOF, no VB, no blurry vision, headaches or peripheral edema, and RUQ pain.  She plans on breast and bottle feeding. She requests PP IUD for birth control. She received her prenatal care at [facility]   Dating: By LMP c/w 8 wk [place] --->  Estimated Date of Delivery: 01/11/20  Sono:  3/22  @[redacted]w[redacted]d , CWD, normal anatomy, cephalic presentation, anterior placental lie, 1205g, 67% EFW   Prenatal History/Complications: Short cervix; did not start progesterone  Past Medical History: No past medical history on file.  Past Surgical History: Past Surgical History:  Procedure Laterality Date  . NO PAST SURGERIES      Obstetrical History: OB History    Gravida  2   Para  1   Term  1   Preterm      AB      Living  1     SAB      TAB      Ectopic      Multiple  0   Live Births  1           Social History: Social History   Socioeconomic History  . Marital status: Single    Spouse name: Not on file  . Number of children: Not on file  . Years of education: Not on file  . Highest education level: Not on file  Occupational History  . Not on file  Tobacco Use  . Smoking status: Never Smoker  . Smokeless tobacco: Never Used  Substance and Sexual Activity  . Alcohol use: Not Currently  . Drug use: Not Currently    Frequency: 3.0 times per week    Types: Marijuana    Comment: pt states last in 2018  . Sexual activity: Yes    Partners: Male    Birth control/protection: None  Other Topics Concern  . Not on file  Social History Narrative  . Not on file   Social Determinants of Health   Financial Resource Strain:   . Difficulty of Paying Living Expenses:   Food Insecurity:   . Worried About 2019 in the Last Year:   . Programme researcher, broadcasting/film/video in the Last Year:   Transportation Needs:   . Barista (Medical):   Automotive engineer Lack of Transportation (Non-Medical):   Physical Activity:   . Days of Exercise per Week:   . Minutes of Exercise per Session:   Stress:   . Feeling of Stress :   Social Connections:   . Frequency of Communication with Friends and Family:   . Frequency of Social Gatherings with Friends and Family:   . Attends Religious Services:   . Active Member of Clubs or Organizations:   . Attends [employer] Meetings:   Banker Marital Status:     Family History: Family History  Problem Relation Age of Onset  . Diabetes Maternal Grandfather   . Diabetes Mother   . Hypertension Mother   . Healthy Father     Allergies: No Known Allergies  Medications Prior to Admission  Medication Sig Dispense Refill Last Dose  . Prenatal Vit-Fe Fumarate-FA (PNV PRENATAL PLUS MULTIVITAMIN) 27-1 MG TABS Take 1 tablet by mouth daily. 30 tablet 11      Review of Systems   All systems reviewed and negative except as  stated in HPI  Last menstrual period 04/06/2019. General appearance: alert, cooperative and appears stated age Lungs: normal effort Heart: regular rate  Abdomen: soft, non-tender; bowel sounds normal Pelvic: gravid uterus Extremities: Homans sign is negative, no sign of DVT Presentation: cephalic Fetal monitoringBaseline: 130 bpm, Variability: Good {> 6 bpm), Accelerations: Reactive and Decelerations: Absent Uterine activity: Frequency: Every 2 minutes     Prenatal labs: ABO, Rh: O/Positive/-- (12/14 1040) Antibody: Negative (12/14 1040) Rubella: 4.62 (12/14 1040) RPR: Non Reactive (05/05 0914)  HBsAg: Negative (12/14 1040)  HIV: Non Reactive (05/05 0914)  GBS: Negative/-- (05/25 0435)  2 hr Glucola WNL Genetic screening  NIPS low risk Anatomy US WNL  Prenatal Transfer Tool  Maternal Diabetes: No Genetic Screening: Normal Maternal Ultrasounds/Referrals: Normal Fetal Ultrasounds or other Referrals:  None Maternal Substance Abuse:   No Significant Maternal Medications:  None Significant Maternal Lab Results: Group B Strep negative  No results found for this or any previous visit (from the past 24 hour(s)).  Patient Active Problem List   Diagnosis Date Noted  . Labor and delivery indication for care or intervention 12/28/2019  . Labor and delivery, indication for care 12/28/2019  . Short cervix affecting pregnancy 08/28/2019  . Poor weight gain of pregnancy, first trimester 07/10/2019  . Back pain complicating pregnancy 29/79/8921  . Supervision of high-risk pregnancy 06/16/2019    Assessment/Plan:  Nevena Rozenberg is a 24 y.o. G2P1001 at [redacted]w[redacted]d here for SOL.   #Labor: Vertex by exam in clinic. Expectant management. AROM PRN. Anticipate SVD. #Pain: Per patient request #FWB: Cat I; EFW: 2900g #ID:  GBS neg #MOF: both #MOC: PP IUD; will order and consent #Circ:  Inpatient   Barrington Ellison, MD [facility] Family Medicine Fellow, [facility] for [facility], [place] [facility] 12/28/2019, 3:18 PM

## 2019-12-28 NOTE — Plan of Care (Signed)
L&D careplan completed 

## 2019-12-29 LAB — RPR: RPR Ser Ql: NONREACTIVE

## 2019-12-29 NOTE — Progress Notes (Signed)
Post Partum Day 1 Subjective: no complaints, up ad lib, voiding and tolerating PO, small lochia, plans to breastfeed, Depo-Provera  Objective: Blood pressure 109/67, pulse 67, temperature 98.5 F (36.9 C), temperature source Oral, resp. rate 16, height 5\' 10"  (1.778 m), weight 78 kg, last menstrual period 04/06/2019, SpO2 100 %, unknown if currently breastfeeding.  Physical Exam:  General: alert, cooperative and no distress Lochia:normal flow Chest: CTAB Heart: RRR no m/r/g Abdomen: +BS, soft, nontender,  Uterine Fundus: firm DVT Evaluation: No evidence of DVT seen on physical exam. Extremities: no edema  Recent Labs    12/28/19 1513  HGB 10.6*  HCT 32.7*    Assessment/Plan: Plan for discharge tomorrow   LOS: 1 day   02/27/20 12/29/2019, 7:36 AM

## 2019-12-30 MED ORDER — POLYETHYLENE GLYCOL 3350 17 GM/SCOOP PO POWD
17.0000 g | Freq: Every day | ORAL | 1 refills | Status: DC | PRN
Start: 2019-12-30 — End: 2021-04-24

## 2019-12-30 MED ORDER — ACETAMINOPHEN 325 MG PO TABS: 650.0000 mg | ORAL_TABLET | Freq: Four times a day (QID) | ORAL | 0 refills | Status: DC | PRN

## 2019-12-30 MED ORDER — IBUPROFEN 600 MG PO TABS: 600.0000 mg | ORAL_TABLET | Freq: Three times a day (TID) | ORAL | 0 refills | Status: DC | PRN

## 2019-12-30 NOTE — Plan of Care (Signed)
  Problem: Education: Goal: Knowledge of condition will improve Outcome: Completed/Met   Problem: Education: Goal: Individualized Educational Video(s) Outcome: Completed/Met   Problem: Education: Goal: Individualized Newborn Educational Video(s) Outcome: Completed/Met   Problem: Activity: Goal: Will verbalize the importance of balancing activity with adequate rest periods Outcome: Completed/Met   Problem: Activity: Goal: Ability to tolerate increased activity will improve Outcome: Completed/Met   Problem: Coping: Goal: Ability to identify and utilize available resources and services will improve Outcome: Completed/Met   Problem: Life Cycle: Goal: Chance of risk for complications during the postpartum period will decrease Outcome: Completed/Met   Problem: Role Relationship: Goal: Ability to demonstrate positive interaction with newborn will improve Outcome: Completed/Met   Problem: Skin Integrity: Goal: Demonstration of wound healing without infection will improve Outcome: Completed/Met

## 2019-12-30 NOTE — Discharge Instructions (Signed)

## 2019-12-30 NOTE — Progress Notes (Addendum)
  CSW received consult due to score 15 on Edinburgh Depression Screen.   CSW met with [name] at bedside to offer support and complete assessment. CSW introduce self and stated purpose for visit. On arrival, [name] and infant Karleen Hampshire were present, however, after PPD/A and SIDS education, [name] stepped out of room to offer [name] privacy during assessment.  [name] and [name] were pleasant and engaged during visit.   CSW provided education regarding the baby blues period vs. perinatal mood disorders, discussed treatment and gave resources for mental health follow up if concerns arise.  CSW recommends self-evaluation during the postpartum time period using the New Mom Checklist from Postpartum Progress and encouraged [name] and [name] to contact a medical professional if symptoms are noted at any time.  Both [name] and [name] stated agreement and denied any questions.   CSW provided review of Sudden Infant Death Syndrome (SIDS) precautions. [name] confirmed having all needed item for baby including car seat and crib and bassinet for baby's safe sleep. Both denied any questions regarding SIDS education.   During assessment, [name] reported EDS score is related to being concerned about parenting two young children, however, she stated she has a lot of support. [name] stated she struggles with asking for support but knows she will have to now that she has two children. [name] denied any mental health dx, SI, HI, or domestic violence.  CSW offered support and encouragement. [name] reported hx of postpartum after birth of first child. [name] explained postartum lasted for six weeks after first son and sx were isolatation. However, [name] explained she was younger and living with her parents. Her parents would not allow [name] to come over so she preferred to stay in her room. [name] stated  circumstances are different now. [name] lives with [name] and both sets of parents are involved. [name] identified [name], mom, dad, and [name]'s mom and dad as support. [name] declines any additional  resources at this time.      CSW identifies no further need for intervention and no barriers to discharge at this time.  Zayn Selley D. Lissa Morales, MSW, [facility] Clinical Social Worker [phone]

## 2020-01-04 ENCOUNTER — Ambulatory Visit (HOSPITAL_BASED_OUTPATIENT_CLINIC_OR_DEPARTMENT_OTHER): Payer: 59 | Admitting: Obstetrics & Gynecology

## 2020-01-04 ENCOUNTER — Other Ambulatory Visit: Payer: Self-pay

## 2020-01-04 ENCOUNTER — Other Ambulatory Visit (HOSPITAL_COMMUNITY)
Admission: RE | Admit: 2020-01-04 | Discharge: 2020-01-04 | Disposition: A | Discharge status: Home / Self Care | Payer: 59 | Source: Ambulatory Visit | Attending: Obstetrics & Gynecology | Admitting: Obstetrics & Gynecology

## 2020-01-04 VITALS — BP 110/67 | HR 89 | Wt 160.0 lb

## 2020-01-04 DIAGNOSIS — N898 Other specified noninflammatory disorders of vagina: Secondary | ICD-10-CM | POA: Insufficient documentation

## 2020-01-04 NOTE — Patient Instructions (Signed)
  Place postpartum visit patient instructions here.  

## 2020-01-04 NOTE — Progress Notes (Signed)
Pt had vaginal delivery appox 1 week ago.  Pt would like exam to make sure her vaginal area is healing well. Pt is still having pain with use of Ibuprofen.  Pt states she also has vaginal odor.  Pt is breast and bottle feeding.   EPDS score 10 today. Pt states she has support but feels she may need more help.

## 2020-01-04 NOTE — Progress Notes (Signed)
POSTPARTUM VISIT ENCOUNTER NOTE  Appointment Date: 01/04/2020  Chief Complaint:  Postpartum Visit  History of Present Illness: Brandi Henson is a 24 y.o. African-American [id number] (Patient's last menstrual period was 04/06/2019 (exact date).), seen for the above chief complaint. Her past medical history is significant for short cervix, back pain.    She is s/p normal spontaneous vaginal delivery at 38 weeks; she was discharged to home on D#2. Pregnancy complicated by second degree laceration. Baby is doing well.  Complains of vaginal discharge, perineal pain.   Vaginal bleeding or discharge: Yes  Mode of feeding infant: Bottle and Breast Intercourse: No  Contraception: Depo-Provera PP depression s/s: No .  Any bowel or bladder issues: No  Pap smear: no abnormalities (date: 11/20201)  Review of Systems: Positive for perineal pain, vaginal discharge malodorous thin brownish discharge. Her 12 point review of systems is negative or as noted in the History of Present Illness.  Patient Active Problem List   Diagnosis Date Noted  . Labor and delivery indication for care or intervention 12/28/2019  . Labor and delivery, indication for care 12/28/2019  . Short cervix affecting pregnancy 08/28/2019  . Poor weight gain of pregnancy, first trimester 07/10/2019  . Back pain complicating pregnancy 07/10/2019  . Supervision of high-risk pregnancy 06/16/2019    Medications Henchy Mccauley had no medications administered during this visit. Current Outpatient Medications  Medication Sig Dispense Refill  . ibuprofen (ADVIL) 600 MG tablet Take 1 tablet (600 mg total) by mouth every 8 (eight) hours as needed for mild pain. 30 tablet 0  . acetaminophen (TYLENOL) 325 MG tablet Take 2 tablets (650 mg total) by mouth every 6 (six) hours as needed (for pain scale < 4). 30 tablet 0  . polyethylene glycol powder (GLYCOLAX/MIRALAX) 17 GM/SCOOP powder Take 17 g by mouth daily as needed. 510 g 1  .  Prenatal Vit-Fe Fumarate-FA (PNV PRENATAL PLUS MULTIVITAMIN) 27-1 MG TABS Take 1 tablet by mouth daily. 30 tablet 11   No current facility-administered medications for this visit.    Allergies Patient has no known allergies.  Physical Exam:  General:  Alert, oriented and cooperative.   Mental Status: Normal mood and affect perceived. Normal judgment and thought content.  Pelvic- small superficial opening in perineal area of 2nd degree repair, will heal by secondary intention.   PP Depression Screening:    Edinburgh Postnatal Depression Scale - 01/04/20 1049      Edinburgh Postnatal Depression Scale:  In the Past 7 Days   I have been able to laugh and see the funny side of things. 1    I have looked forward with enjoyment to things. 0    I have blamed myself unnecessarily when things went wrong. 0    I have been anxious or worried for no good reason. 0    I have felt scared or panicky for no good reason. 0    Things have been getting on top of me. 2    I have been so unhappy that I have had difficulty sleeping. 2    I have felt sad or miserable. 2    I have been so unhappy that I have been crying. 3    The thought of harming myself has occurred to me. 0    Edinburgh Postnatal Depression Scale Total 10           Assessment:Patient is a 24 y.o. [id number] who is 3 weeks postpartum from a normal spontaneous vaginal delivery.  She is doing well.   Plan: Vaginal perineal assessment.  1. Vaginal discharge [place] [facility]                                                                                                                             - Cervicovaginal ancillary only( )   RTC 3 weeks  I discussed the assessment and treatment plan with the patient. The patient was provided an opportunity to ask questions and all were answered. The patient agreed with the plan and demonstrated an understanding of the instructions.   The patient was advised to call back or seek an  in-person evaluation/go to the ED for any concerning postpartum symptoms.  I provided 1 minutes of non-face-to-face time during this encounter.                                                                                                 Cherre Blanc, MD Center for [employer], [place]

## 2020-01-05 LAB — CERVICOVAGINAL ANCILLARY ONLY
Bacterial Vaginitis (gardnerella): POSITIVE — AB
Candida Glabrata: NEGATIVE
Candida Vaginitis: NEGATIVE
Chlamydia: NEGATIVE
Comment: NEGATIVE
Comment: NEGATIVE
Comment: NEGATIVE
Comment: NEGATIVE
Comment: NEGATIVE
Comment: NORMAL
Neisseria Gonorrhea: NEGATIVE
Trichomonas: NEGATIVE

## 2020-01-08 ENCOUNTER — Other Ambulatory Visit: Payer: Self-pay | Admitting: Obstetrics & Gynecology

## 2020-01-08 ENCOUNTER — Telehealth: Payer: Self-pay | Admitting: Lactation Services

## 2020-01-08 MED ORDER — METRONIDAZOLE 500 MG PO TABS
500.0000 mg | ORAL_TABLET | Freq: Two times a day (BID) | ORAL | 0 refills | Status: DC
Start: 2020-01-08 — End: 2020-01-08

## 2020-01-08 MED ORDER — METRONIDAZOLE 500 MG PO TABS
500.0000 mg | ORAL_TABLET | Freq: Two times a day (BID) | ORAL | 0 refills | Status: AC
Start: 2020-01-08 — End: 2020-01-15

## 2020-01-08 NOTE — Telephone Encounter (Signed)
Called patient to give results. Received a message that voice mailbox not set up yet.   My Chart message sent.

## 2020-01-08 NOTE — Telephone Encounter (Signed)
-----   Message from Malachy Chamber, MD sent at 01/08/2020 10:10 AM EDT ----- Flagyl sent to pharmacy

## 2020-01-08 NOTE — Progress Notes (Signed)
BV noted, flagyl sent to pharmacy.

## 2020-01-26 ENCOUNTER — Ambulatory Visit: Payer: Medicaid Other | Admitting: Obstetrics and Gynecology

## 2020-02-06 ENCOUNTER — Telehealth (INDEPENDENT_AMBULATORY_CARE_PROVIDER_SITE_OTHER): Payer: 59 | Admitting: Obstetrics

## 2020-02-06 ENCOUNTER — Encounter: Payer: Self-pay | Admitting: Obstetrics

## 2020-02-06 DIAGNOSIS — Z1332 Encounter for screening for maternal depression: Secondary | ICD-10-CM | POA: Diagnosis not present

## 2020-02-06 DIAGNOSIS — O99345 Other mental disorders complicating the puerperium: Secondary | ICD-10-CM

## 2020-02-06 DIAGNOSIS — F53 Postpartum depression: Secondary | ICD-10-CM

## 2020-02-06 NOTE — Progress Notes (Signed)
..    I connected with@ on 02/06/20 at 11:00 AM EDT by: Mychart video and verified that I am speaking with the correct person using two identifiers.  Patient is located at home and provider is located at [employer] for [employer] at [place] .     The purpose of this virtual visit is to provide medical care while limiting exposure to the novel coronavirus. I discussed the limitations, risks, security and privacy concerns of performing an evaluation and management service by [facility] and the availability of in person appointments. I also discussed with the patient that there may be a patient responsible charge related to this service. By engaging in this virtual visit, you consent to the provision of healthcare.  Additionally, you authorize for your insurance to be billed for the services provided during this visit.  The patient expressed understanding and agreed to proceed.  The following staff members participated in the virtual visit:  Natale Milch, RN and Coral Ceo, MD  Post Partum Visit Note Subjective:   Brandi Henson is a 24 y.o. [phone] female being evaluated for postpartum followup.  She is 5 weeks postpartum following a normal spontaneous vaginal delivery at  38.0 gestational weeks.  I have fully reviewed the prenatal and intrapartum course; pregnancy complicated by short cervix.  Postpartum course has been good. Baby is doing well. Baby is feeding by both breast and bottle - Ger[place]. Bleeding staining only. Bowel function is abnormal: constipated and reports hemorrhoids. Bladder function is normal. Patient is not sexually active. Contraception method is Depo-Provera injections. Postpartum depression screening: positive.  The following portions of the patient's history were reviewed and updated as appropriate: allergies, current medications, past family history, past medical history, past social history, past surgical history and problem list.  Review of  Systems Behavioral/Psych: positive for depression   Objective:  There were no vitals filed for this visit. Self-Obtained       Assessment:     Plan:  Essential components of care per ACOG recommendations:  1.  Mood and well being: Patient with positive depression screening today. Reviewed local resources for support.  - Patient does not use tobacco.  - hx of drug use? No    2. Infant care and feeding:  -Patient currently breastmilk feeding? Yes If breastmilk feeding discussed return to work and pumping. If needed, patient was provided letter for work to allow for every 2-3 hr pumping breaks, and to be granted a private location to express breastmilk and refrigerated area to store breastmilk. Reviewed importance of draining breast regularly to support lactation. -Social determinants of health (SDOH) reviewed in EPIC. No concerns.  3. Sexuality, contraception and birth spacing - Patient does not want a pregnancy in the next year.  Desired family size is 2 children.  - Reviewed forms of contraception in tiered fashion. Patient desired IUD today.   - Discussed birth spacing of 18 months  4. Sleep and fatigue -Encouraged family/partner/community support of 4 hrs of uninterrupted sleep to help with mood and fatigue  5. Physical Recovery  - Discussed patients delivery - Patient had a 2nd degree laceration, perineal healing reviewed. Patient expressed understanding - Patient has urinary incontinence? No. - Patient is safe to resume physical and sexual activity  6.  Health Maintenance - Last pap smear done 03-08-2018 and was normal with negative HPV.  7. No Chronic Disease  15 minutes of non-face-to-face time spent with the patient    Katrina Stack, RN Center for [employer], [employer]  [facility] 02/06/20

## 2020-02-07 ENCOUNTER — Other Ambulatory Visit: Payer: Self-pay

## 2020-02-07 ENCOUNTER — Telehealth: Payer: Self-pay

## 2020-02-07 DIAGNOSIS — F53 Postpartum depression: Secondary | ICD-10-CM

## 2020-02-07 NOTE — Telephone Encounter (Signed)
Late entry from 02/06/20 Return call to pt on 02/06/20 end of day  pt states she saw Dr.Harper regarding Depression and was told to F/U with her job . Pt spoke with employer and need written doctors note to work part time hours (8 hrs) . Staff message was sent to Dr.Harper yesterday 02/06/20 to advise. Pt voiced understanding.

## 2020-02-13 ENCOUNTER — Ambulatory Visit (INDEPENDENT_AMBULATORY_CARE_PROVIDER_SITE_OTHER): Payer: 59 | Admitting: Licensed Clinical Social Worker

## 2020-02-13 DIAGNOSIS — F53 Postpartum depression: Secondary | ICD-10-CM

## 2020-02-13 DIAGNOSIS — O99345 Other mental disorders complicating the puerperium: Secondary | ICD-10-CM

## 2020-02-14 NOTE — BH Specialist Note (Signed)
Integrated Behavioral Health via Telemedicine Video (Caregility) Visit  02/14/2020 Brandi Henson [id number]  Number of Integrated Behavioral Health visits: 1 Session Start time: 9:30am Session End time: 10:00am Total time: 30 mins via mychart   Referring Provider: Aron Baba MD  Type of Visit: Video Patient/Family location: [facility] Provider location: [facility]  All persons participating in visit: Brandi Henson and LCSWA A. Cherron Blitzer   Confirmed patient's address: yes  Confirmed patient's phone number: yes Any changes to demographics: no   Confirmed patient's insurance: no Any changes to patient's insurance: no  Discussed confidentiality: yes  I connected with Brandi Henson and/or Brandi Henson  by a video enabled telemedicine application (Caregility) and verified that I am speaking with the correct person using two identifiers.     I discussed the limitations of evaluation and management by telemedicine and the availability of in person appointments.  I discussed that the purpose of this visit is to provide behavioral health care while limiting exposure to the novel coronavirus.   Discussed there is a possibility of technology failure and discussed alternative modes of communication if that failure occurs.  I discussed that engaging in this virtual visit, they consent to the provision of behavioral healthcare and the services will be billed under their insurance.  Patient and/or legal guardian expressed understanding and consented to virtual visit: yes  PRESENTING CONCERNS: Patient and/or family reports the following symptoms/concerns: Postpartum depression  Duration of problem: approx 3 weeks ; Severity of problem: Mild   STRENGTHS (Protective Factors/Coping Skills): Stable housing, employed   GOALS ADDRESSED: Patient will: 1.  Reduce symptoms of: lack of sleep, depressed mood, loss of interest, feeling of guilt  2.  Increase knowledge and/or ability of: diagnosis and  implement coping skills to alleviate symptoms  3.  Demonstrate ability to: self manage symptoms   INTERVENTIONS: Interventions utilized:  Supportive counseling  Standardized Assessments completed:    Integrated Behavioral Health from 02/13/2020 in [facility]  PHQ-9 Total Score 13      ASSESSMENT: Patient currently experiencing postpartum depression  Patient may benefit from integrated behavioral health   PLAN: 1. Follow up with behavioral health clinician on : 3 weeks via mychart.  2. Behavioral recommendations: Communicate needs to [name], incorporate three year old with caring for newborn, prioritize sleep and engage in self care and increase social interaction  3. Referral(s): None   I discussed the assessment and treatment plan with the patient and/or parent/guardian. They were provided an opportunity to ask questions and all were answered. They agreed with the plan and demonstrated an understanding of the instructions.   They were advised to call back or seek an in-person evaluation if the symptoms worsen or if the condition fails to improve as anticipated.  Gwyndolyn Saxon

## 2020-02-19 ENCOUNTER — Encounter: Payer: Self-pay | Admitting: Licensed Clinical Social Worker

## 2020-02-19 ENCOUNTER — Encounter: Payer: Self-pay | Admitting: Obstetrics

## 2020-02-19 NOTE — Progress Notes (Signed)
Pt came into [facility] office requesting return to work note for reduced hours. Pt provided return to work note for 6 hours per week for two week and returning without restrictions going forward. Pt advised additional reduced hours or time off will be further evaluated by primary care physician. Patient expressed understanding.

## 2020-02-28 ENCOUNTER — Other Ambulatory Visit: Payer: Self-pay

## 2020-02-28 NOTE — Progress Notes (Signed)
Pt walked in office with request to change work hrs note.

## 2020-02-28 NOTE — Progress Notes (Signed)
Pt walked in office requesting second change to work hours note.  Letter changes to pt working 8 hrs a day for 2 week then after 2 weeks return to full duty no restriction.

## 2020-03-04 ENCOUNTER — Encounter: Payer: 59 | Admitting: Internal Medicine

## 2020-03-04 ENCOUNTER — Encounter: Payer: Self-pay | Admitting: General Practice

## 2020-03-04 ENCOUNTER — Telehealth: Payer: Self-pay | Admitting: *Deleted

## 2020-03-04 NOTE — Progress Notes (Deleted)
   CC: ***  HPI:  [name] is a 24 y.o. female with PMHx as documented below, presented with ***. Please refer to problem based charting for further details and assessment and plan of current problem and chronic medical conditions.   No past medical history on file.   Medications: Miralax, Ibuprophen, Tylenol  Review of Systems:  ***  Physical Exam:  There were no vitals filed for this visit. *** Constitutional: ***Well-developed and well-nourished. No acute distress.  HENT:  Head: Normocephalic and atraumatic.  Eyes: ***Conjunctivae are normal, EOM nl Cardiovascular: *** RRR, nl S1S2, ***no murmur, *** no LEE Respiratory: ***Effort normal and breath sounds normal. No respiratory distress. No wheezes.  GI: ***Soft. Bowel sounds are normal. No distension. There is no tenderness.  Neurological: Is alert and oriented x 3 *** Skin: Not diaphoretic. No erythema.  Psychiatric: *** Normal mood and affect. Behavior is normal. Judgment and thought content normal.   Assessment & Plan:   See Encounters Tab for problem based charting.  Patient discussed with Dr. Mikey Bussing

## 2020-03-04 NOTE — Telephone Encounter (Signed)
Pt did not come to her appt this am. Pt was called - no answer ; unable to leave a message, vm has not been set up.

## 2020-04-05 ENCOUNTER — Other Ambulatory Visit: Payer: Self-pay

## 2020-04-05 DIAGNOSIS — Z3042 Encounter for surveillance of injectable contraceptive: Secondary | ICD-10-CM

## 2020-04-05 MED ORDER — MEDROXYPROGESTERONE ACETATE 150 MG/ML IM SUSP: 150.0000 mg | INTRAMUSCULAR | 3 refills | Status: DC

## 2020-04-11 ENCOUNTER — Ambulatory Visit (INDEPENDENT_AMBULATORY_CARE_PROVIDER_SITE_OTHER): Payer: Medicaid Other

## 2020-04-11 ENCOUNTER — Other Ambulatory Visit: Payer: Self-pay

## 2020-04-11 DIAGNOSIS — Z3042 Encounter for surveillance of injectable contraceptive: Secondary | ICD-10-CM

## 2020-04-11 MED ORDER — MEDROXYPROGESTERONE ACETATE 150 MG/ML IM SUSP
150.0000 mg | Freq: Once | INTRAMUSCULAR | Status: AC
  Administered 2020-04-11: 150 mg via INTRAMUSCULAR

## 2020-04-11 NOTE — Progress Notes (Signed)
Pt here for Depo Provera injection. Last injection given on 6/4 in the hospital before being discharged. Pt has made it within the 15 week window per protocol book. Injection given in RUOQ and tolerated well. Pt supplied medication. Pt will return to office on 12/9 for next injection.

## 2020-05-23 ENCOUNTER — Other Ambulatory Visit: Payer: Self-pay

## 2020-05-23 NOTE — Patient Outreach (Signed)
Care Coordination  05/23/2020  Brandi Henson 1996-01-17 [id number]  Brandi Henson is a 23 y.o. year old female who sees Patient, No Pcp Per for primary care. The  [facility] Managed Care team was consulted for assistance with Patient does not have a PCP. Housing barriers. Brandi Henson was given information about Care Management services, agreed to services, and verbal consent for services was obtained.  Interventions:  . Patient interviewed and appropriate assessments performed . Collaborated with clinical team regarding patient needs  . SDOH (Social Determinants of Health) assessments performed: Yes  BSW provided patient with resources to assist with rent. [address] [phone], [employer] [phone] and [employer] [phone].  Brandi [facility] with [facility]. BSW scheduled patient a new PCP appointment for 06/07/20 at 9:20 am.  Plan:  . Over the next 30 days, patient will work with BSW to address needs related to Provider Appointment needs. . Housing barriers . Social Worker will follow up with patient in 30 days.Brandi Henson, BSW, MHA [facility]  [place]  High Risk Managed Medicaid Team

## 2020-05-23 NOTE — Patient Instructions (Signed)
Visit Information  Brandi Henson was given information about Medicaid Managed Care team care coordination services as a part of their Healthy [facility] Medicaid benefit. [name] verbally consented to engagement with the [facility] Managed Care team.    Your new patient appointment has been scheduled for 06/07/20 at 9:20 am at The Patient  [facility], located at [address] [place], [place], [address]. They can be reached  at [phone].    The resources provided to you for rental assistance include [address]  [phone], [facility] [phone], and [employer]  [phone].  For questions related to your Healthy [facility] health plan, please call: [phone] or visit the homepage here: [email or web address]  If you would like to schedule transportation through your Healthy [facility] plan, please call the following number at least 2 days in advance of your appointment: [phone]    Social Worker will follow up with patient in 30 days.   Gus Puma, BSW, [place] [facility]   [place]  High Risk Managed Medicaid Team

## 2020-06-07 ENCOUNTER — Ambulatory Visit: Payer: Medicaid Other | Admitting: Family Medicine

## 2020-06-25 ENCOUNTER — Other Ambulatory Visit: Payer: Self-pay

## 2020-06-25 NOTE — Patient Outreach (Signed)
Care Coordination  06/25/2020  Brandi Henson 12-04-1995 [id number]  An unsuccessful telephone outreach was attempted today. The patient was referred to the case management team for assistance with care management and care coordination.   Follow Up Plan: The patient has been provided with contact information for the Managed Medicaid care management team and has been advised to call with any health related questions or concerns.   Gus Puma, BSW, [place] [facility]  [place]  High Risk Managed Medicaid Team

## 2020-06-25 NOTE — Patient Instructions (Signed)
Visit Information  Ms. [employer]  - as a part of your Medicaid benefit, you are eligible for care management and care coordination services at no cost or copay. I was unable to reach you by phone today but would be happy to help you with your health related needs. Please feel free to call me @ [phone].     Gus Puma, BSW, [place] [facility]  [place]  High Risk Managed Medicaid Team

## 2020-07-04 ENCOUNTER — Other Ambulatory Visit: Payer: Self-pay

## 2020-07-04 ENCOUNTER — Ambulatory Visit (INDEPENDENT_AMBULATORY_CARE_PROVIDER_SITE_OTHER): Payer: Medicaid Other

## 2020-07-04 DIAGNOSIS — Z3042 Encounter for surveillance of injectable contraceptive: Secondary | ICD-10-CM | POA: Diagnosis not present

## 2020-07-04 MED ORDER — MEDROXYPROGESTERONE ACETATE 150 MG/ML IM SUSP
150.0000 mg | Freq: Once | INTRAMUSCULAR | Status: AC
  Administered 2020-07-04: 150 mg via INTRAMUSCULAR

## 2020-07-04 NOTE — Progress Notes (Addendum)
GYN presents for DEPO Injection, given in LUOQ, tolerated well.  Last PAP 03/08/2018  Next DEPO due Feb. 24 - Mar. 10, 2022  Administrations This Visit    medroxyPROGESTERone (DEPO-PROVERA) injection 150 mg    Admin Date 07/04/2020 Action Given Dose 150 mg Route Intramuscular Administered By Maretta Bees, RMA           Chart reviewed for nurse visit. Agree with plan of care.   Currie Paris, NP 07/04/2020 4:44 PM

## 2020-09-24 ENCOUNTER — Other Ambulatory Visit: Payer: Self-pay

## 2020-09-24 ENCOUNTER — Ambulatory Visit (INDEPENDENT_AMBULATORY_CARE_PROVIDER_SITE_OTHER): Payer: Medicaid Other | Admitting: *Deleted

## 2020-09-24 DIAGNOSIS — Z3042 Encounter for surveillance of injectable contraceptive: Secondary | ICD-10-CM

## 2020-09-24 MED ORDER — MEDROXYPROGESTERONE ACETATE 150 MG/ML IM SUSP
150.0000 mg | Freq: Once | INTRAMUSCULAR | Status: AC
  Administered 2020-09-24: 150 mg via INTRAMUSCULAR

## 2020-09-24 NOTE — Progress Notes (Signed)
Pt is in office for Depo injection. Pt tolerated injection well. Pt advised to RTO 5/17-31 for next depo.   Administrations This Visit    medroxyPROGESTERone (DEPO-PROVERA) injection 150 mg    Admin Date 09/24/2020 Action Given Dose 150 mg Route Intramuscular Administered By Lanney Gins, CMA

## 2020-12-20 ENCOUNTER — Ambulatory Visit: Payer: Medicaid Other

## 2020-12-26 ENCOUNTER — Other Ambulatory Visit: Payer: Self-pay

## 2020-12-26 ENCOUNTER — Ambulatory Visit (INDEPENDENT_AMBULATORY_CARE_PROVIDER_SITE_OTHER): Payer: Medicaid Other

## 2020-12-26 VITALS — BP 106/65 | HR 85 | Ht 70.0 in | Wt 155.0 lb

## 2020-12-26 DIAGNOSIS — Z3042 Encounter for surveillance of injectable contraceptive: Secondary | ICD-10-CM | POA: Diagnosis not present

## 2020-12-26 MED ORDER — MEDROXYPROGESTERONE ACETATE 150 MG/ML IM SUSP
150.0000 mg | Freq: Once | INTRAMUSCULAR | Status: AC
  Administered 2020-12-26: 150 mg via INTRAMUSCULAR

## 2020-12-26 NOTE — Progress Notes (Addendum)
Subjective:  Brandi Henson is a 25 y.o. female here for  DEPO Injection.  Objective:  BP 106/65   Pulse 85   Ht 5\' 10"  (1.778 m)   Wt 155 lb (70.3 kg)   BMI 22.24 kg/m   Appearance alert, well appearing, and in no distress. General exam BP noted to be well controlled today in office.    Assessment:   Last PAP 03/08/2018    Plan:  DEPO Injection given in LUOQ, tolerated well. Next DEPO due August 18 - Sept. 1, 2022  Administrations This Visit    medroxyPROGESTERone (DEPO-PROVERA) injection 150 mg    Admin Date 12/26/2020 Action Given Dose 150 mg Route Intramuscular Administered By 02/25/2021, RMA

## 2021-03-18 ENCOUNTER — Other Ambulatory Visit: Payer: Self-pay

## 2021-03-18 ENCOUNTER — Ambulatory Visit (INDEPENDENT_AMBULATORY_CARE_PROVIDER_SITE_OTHER): Payer: Medicaid Other | Admitting: *Deleted

## 2021-03-18 DIAGNOSIS — Z3042 Encounter for surveillance of injectable contraceptive: Secondary | ICD-10-CM | POA: Diagnosis not present

## 2021-03-18 MED ORDER — MEDROXYPROGESTERONE ACETATE 150 MG/ML IM SUSP
150.0000 mg | Freq: Once | INTRAMUSCULAR | Status: AC
  Administered 2021-03-18: 150 mg via INTRAMUSCULAR

## 2021-03-18 NOTE — Progress Notes (Signed)
Subjective:  Brandi Henson is a 25 y.o. female here for  DEPO Injection.   Objective:  Appearance alert, well appearing, and in no distress.  Assessment:   Last PAP 03/08/2018    Plan:  DEPO Injection given, tolerated well. Next DEPO due 11/8-11/22/22 Pt will be due for AEX prior to appt.    Administrations This Visit     medroxyPROGESTERone (DEPO-PROVERA) injection 150 mg     Admin Date 03/18/2021 Action Given Dose 150 mg Route Intramuscular Administered By Lanney Gins, CMA

## 2021-03-18 NOTE — Progress Notes (Signed)
Patient was assessed and managed by nursing staff during this encounter. I have reviewed the chart and agree with the documentation and plan. I have also made any necessary editorial changes.  Catalina Antigua, MD 03/18/2021 11:26 AM

## 2021-04-24 ENCOUNTER — Encounter: Payer: Self-pay | Admitting: Medical

## 2021-04-24 ENCOUNTER — Other Ambulatory Visit (HOSPITAL_COMMUNITY)
Admission: RE | Admit: 2021-04-24 | Discharge: 2021-04-24 | Disposition: A | Discharge status: Home / Self Care | Payer: Medicaid Other | Source: Ambulatory Visit | Attending: Medical | Admitting: Medical

## 2021-04-24 ENCOUNTER — Ambulatory Visit (INDEPENDENT_AMBULATORY_CARE_PROVIDER_SITE_OTHER): Payer: Medicaid Other | Admitting: Medical

## 2021-04-24 ENCOUNTER — Other Ambulatory Visit: Payer: Self-pay

## 2021-04-24 VITALS — BP 120/78 | HR 93 | Ht 70.0 in | Wt 161.0 lb

## 2021-04-24 DIAGNOSIS — Z124 Encounter for screening for malignant neoplasm of cervix: Secondary | ICD-10-CM | POA: Diagnosis not present

## 2021-04-24 DIAGNOSIS — Z113 Encounter for screening for infections with a predominantly sexual mode of transmission: Secondary | ICD-10-CM

## 2021-04-24 DIAGNOSIS — Z01419 Encounter for gynecological examination (general) (routine) without abnormal findings: Secondary | ICD-10-CM

## 2021-04-24 NOTE — Progress Notes (Signed)
History:  Ms. Brandi Henson is a 25 y.o. [phone] who presents to clinic today for annual exam with pap smear. Patient is current on Depo provera for birth control and does not have periods. Last pap smear was 03/08/2018 and was normal. Patient denies history of abnormal pap smear. She denies any GYN complaints today. She would like STD testings, but declines blood draw.    The following portions of the patient's history were reviewed and updated as appropriate: allergies, current medications, family history, past medical history, social history, past surgical history and problem list.  Review of Systems:  Review of Systems  Constitutional:  Negative for chills and fever.  Gastrointestinal:  Negative for abdominal pain.  Genitourinary:  Negative for dysuria, frequency and urgency.       Neg - vaginal bleeding, discharge     Objective:  Physical Exam BP 120/78   Pulse 93   Ht 5\' 10"  (1.778 m)   Wt 161 lb (73 kg)   BMI 23.10 kg/m  Physical Exam Vitals and nursing note reviewed. Exam conducted with a chaperone present.  Constitutional:      General: She is not in acute distress.    Appearance: Normal appearance. She is well-developed and normal weight.  HENT:     Head: Normocephalic and atraumatic.  Neck:     Thyroid: No thyromegaly.  Cardiovascular:     Rate and Rhythm: Normal rate and regular rhythm.     Heart sounds: No murmur heard. Pulmonary:     Effort: Pulmonary effort is normal. No respiratory distress.     Breath sounds: Normal breath sounds. No wheezing.  Abdominal:     General: Abdomen is flat. Bowel sounds are normal. There is no distension.     Palpations: Abdomen is soft. There is no mass.     Tenderness: There is no abdominal tenderness. There is no guarding or rebound.  Genitourinary:    General: Normal vulva.     Vagina: Vaginal discharge (scant, white, non-odorous) present. No bleeding.     Cervix: No cervical motion tenderness, discharge or friability.      Uterus: Not enlarged and not tender.      Adnexa:        Right: No mass or tenderness.         Left: No mass or tenderness.    Musculoskeletal:     Cervical back: Neck supple.  Skin:    General: Skin is warm and dry.     Findings: No erythema.  Neurological:     Mental Status: She is alert and oriented to person, place, and time.  Psychiatric:        Mood and Affect: Mood normal.        Behavior: Behavior normal.        Thought Content: Thought content normal.     Labs and Imaging Health Maintenance Due  Topic Date Due   HPV VACCINES (1 - 2-dose series) Never done   Hepatitis C Screening  Never done   INFLUENZA VACCINE  02/24/2021   PAP-Cervical Cytology Screening  03/08/2021   PAP SMEAR-Modifier  03/08/2021     Assessment & Plan:  1. Screening for cervical cancer - Cytology - PAP  2. Encounter for annual routine gynecological examination  3. Screening for STD (sexually transmitted disease) - Cervicovaginal ancillary only( [place])  Patient advised to return as scheduled for Depo Provera. If pap smear is normal, she will return for next pap smear in 2025.  Approximately 10 minutes of total time was spent with this patient on history taking, physical exam, patient education and documentation.   Marny Lowenstein, PA-C 04/24/2021 8:44 AM

## 2021-04-24 NOTE — Progress Notes (Signed)
Patient presents for Annual Exam Today.  LMP: no cycle w/ depo. Last pap:03/08/2018 WNL  Contraception: Depo last injection 03/18/21 STD Screening: [facility] Sw[facility] only  Family Hx of Breast Cancer: None   CC: None

## 2021-04-25 LAB — CERVICOVAGINAL ANCILLARY ONLY
Chlamydia: NEGATIVE
Comment: NEGATIVE
Comment: NEGATIVE
Comment: NORMAL
Neisseria Gonorrhea: NEGATIVE
Trichomonas: NEGATIVE

## 2021-04-25 LAB — CYTOLOGY - PAP
Adequacy: ABSENT
Diagnosis: NEGATIVE

## 2021-06-08 ENCOUNTER — Other Ambulatory Visit: Payer: Self-pay | Admitting: Obstetrics

## 2021-06-08 DIAGNOSIS — Z3042 Encounter for surveillance of injectable contraceptive: Secondary | ICD-10-CM

## 2021-06-10 ENCOUNTER — Ambulatory Visit (INDEPENDENT_AMBULATORY_CARE_PROVIDER_SITE_OTHER): Payer: Medicaid Other

## 2021-06-10 ENCOUNTER — Other Ambulatory Visit: Payer: Self-pay

## 2021-06-10 DIAGNOSIS — Z3042 Encounter for surveillance of injectable contraceptive: Secondary | ICD-10-CM | POA: Diagnosis not present

## 2021-06-10 MED ORDER — MEDROXYPROGESTERONE ACETATE 150 MG/ML IM SUSP
150.0000 mg | Freq: Once | INTRAMUSCULAR | Status: AC
  Administered 2021-06-10: 150 mg via INTRAMUSCULAR

## 2021-06-10 NOTE — Progress Notes (Signed)
Patient was assessed and managed by nursing staff during this encounter. I have reviewed the chart and agree with the documentation and plan. I have also made any necessary editorial changes.  Catalina Antigua, MD 06/10/2021 9:20 AM

## 2021-06-10 NOTE — Progress Notes (Signed)
Subjective:  Brandi Henson is a 25 y.o. female here for  DEPO Injection  Objective: Appearance alert, well appearing, and in no distress.  Assessment:  Injection given in . Patient tolerated well.  Plan: Next depo due 1/31-2/14.

## 2021-08-02 ENCOUNTER — Other Ambulatory Visit: Payer: Self-pay

## 2021-08-02 ENCOUNTER — Emergency Department (HOSPITAL_COMMUNITY)
Admission: EM | Admit: 2021-08-02 | Discharge: 2021-08-02 | Disposition: A | Discharge status: Home / Self Care | Payer: Medicaid Other | Attending: Emergency Medicine | Admitting: Emergency Medicine

## 2021-08-02 ENCOUNTER — Emergency Department (HOSPITAL_COMMUNITY): Payer: Medicaid Other

## 2021-08-02 ENCOUNTER — Encounter (HOSPITAL_COMMUNITY): Payer: Self-pay

## 2021-08-02 DIAGNOSIS — M25572 Pain in left ankle and joints of left foot: Secondary | ICD-10-CM | POA: Insufficient documentation

## 2021-08-02 DIAGNOSIS — M25562 Pain in left knee: Secondary | ICD-10-CM | POA: Diagnosis not present

## 2021-08-02 DIAGNOSIS — W108XXA Fall (on) (from) other stairs and steps, initial encounter: Secondary | ICD-10-CM | POA: Insufficient documentation

## 2021-08-02 NOTE — ED Provider Notes (Signed)
[facility] EMERGENCY DEPARTMENT Provider Note   CSN: [id number] Arrival date & time: 08/02/21  0805     History  No chief complaint on file.   Brandi Henson is a 26 y.o. female.  With no pertinent past medical history presents today with chief complaint of left knee and ankle pain.  She states it started after she fell on the steps 2 days ago.  She has been ambulatory since with some pain.  She has not tried anything for pain.  She denies any loss of consciousness, and did not hit her head. She is not anticoagulated.  The history is provided by the patient. No language interpreter was used.      Home Medications Prior to Admission medications   Medication Sig Start Date End Date Taking? Authorizing Provider  medroxyPROGESTERone (DEPO-PROVERA) 150 MG/ML injection INJECT 1 ML (150 MG TOTAL) INTO THE MUSCLE EVERY 3 (THREE) MONTHS. 06/09/21   Warden Fillers, MD      Allergies    Patient has no known allergies.    Review of Systems   Review of Systems  Constitutional:  Negative for chills and fever.  Musculoskeletal:  Positive for arthralgias and myalgias.  All other systems reviewed and are negative.  Physical Exam Updated Vital Signs BP (!) 143/85    Pulse 87    Temp 98.9 F (37.2 C)    Resp 18    SpO2 95%  Physical Exam Vitals and nursing note reviewed.  Constitutional:      General: She is not in acute distress.    Appearance: Normal appearance. She is normal weight. She is not ill-appearing, toxic-appearing or diaphoretic.     Comments: Patient sitting comfortably in chair no acute distress  Cardiovascular:     Rate and Rhythm: Normal rate.  Pulmonary:     Effort: Pulmonary effort is normal. No respiratory distress.  Musculoskeletal:     Comments: Patient ambulatory with some pain, normal gait.   Normal ROM noted to left knee and ankle.  Tenderness to palpation along lateral malleolus with minimal swelling.  No obvious deformity or bruising  noted.  Neurological:     Mental Status: She is alert.    ED Results / Procedures / Treatments   Labs (all labs ordered are listed, but only abnormal results are displayed) Labs Reviewed - No data to display  EKG None  Radiology DG Ankle Complete Left  Result Date: 08/02/2021 CLINICAL DATA:  Fall, left ankle pain EXAM: LEFT ANKLE COMPLETE - 3+ VIEW COMPARISON:  None. FINDINGS: There is no evidence of fracture, dislocation, or joint effusion. There is no evidence of arthropathy or other focal bone abnormality. Soft tissues are unremarkable. IMPRESSION: Negative. Electronically Signed   By: Larose Hires D.O.   On: 08/02/2021 08:59   DG Knee Complete 4 Views Left  Result Date: 08/02/2021 CLINICAL DATA:  Fall.  Left knee pain. EXAM: LEFT KNEE - COMPLETE 4+ VIEW COMPARISON:  None. FINDINGS: No evidence of fracture, dislocation, or joint effusion. No evidence of arthropathy or other focal bone abnormality. Soft tissues are unremarkable. IMPRESSION: Negative. Electronically Signed   By: Amie Portland M.D.   On: 08/02/2021 08:59    Procedures Procedures    Medications Ordered in ED Medications - No data to display  ED Course/ Medical Decision Making/ A&P                           Medical Decision  Making  Patient presents today following injury 2 days ago.  Patient X-Ray negative for obvious fracture or dislocation. Pain managed in ED. she is afebrile, nontoxic-appearing, and in no acute distress and is ambulatory with minimal pain.  Pt advised to follow up with orthopedics if symptoms persist for possibility of missed fracture diagnosis. Patient given brace while in ED, conservative therapy recommended and discussed. Patient will be dc home & is agreeable with above plan.  Discharged in stable condition.   Final Clinical Impression(s) / ED Diagnoses Final diagnoses:  Fall (on) (from) other stairs and steps, initial encounter    Rx / DC Orders ED Discharge Orders     None       An After Visit Summary was printed and given to the patient.    Vear Clock 08/02/21 0920    Derwood Kaplan, MD 08/02/21 1019

## 2021-08-02 NOTE — ED Triage Notes (Signed)
Patient complains of left ankle and left knee pain after suffering fall down steps 2 dys ago, pain with ambulation. No obvious deformity

## 2021-08-02 NOTE — Discharge Instructions (Addendum)
Your x-rays were negative for fracture or dislocation.  You have been given a brace for support, please wear this while walking or doing activity. Make sure you are resting, icing, and elevating over the next few days.  If you are pain does not improve, I have given you a referral to Ortho for further management as needed.  Please call them to schedule an appointment.  Return if development of any new or worsening symptoms.

## 2021-08-02 NOTE — ED Provider Triage Note (Signed)
Emergency Medicine Provider Triage Evaluation Note  Brandi Henson , a 26 y.o. female  was evaluated in triage.  Pt complains of left knee and left ankle pain.  States that same began after she fell down the steps 2 days ago.  She has been ambulatory since with some pain.  She did not hit her head or any loss of consciousness.  She is not anticoagulated  Review of Systems  Positive:  Negative:   Physical Exam  There were no vitals taken for this visit. Gen:   Awake, no distress   Resp:  Normal effort  MSK:   Moves extremities without difficulty, no obvious deformity Other:    Medical Decision Making  Medically screening exam initiated at 8:11 AM.  Appropriate orders placed.  Tangee Peddle was informed that the remainder of the evaluation will be completed by another provider, this initial triage assessment does not replace that evaluation, and the importance of remaining in the ED until their evaluation is complete.     Bud Face, PA-C 08/02/21 [phone]

## 2021-08-04 ENCOUNTER — Telehealth: Payer: Self-pay

## 2021-08-04 DIAGNOSIS — Z9189 Other specified personal risk factors, not elsewhere classified: Secondary | ICD-10-CM

## 2021-08-04 NOTE — Telephone Encounter (Signed)
Transition Care Management Unsuccessful Follow-up Telephone Call  Date of discharge and from where:  08/02/2021-[place]  Attempts:  1st Attempt  Reason for unsuccessful TCM follow-up call:  Left voice message  No PCP, Possible referral needed

## 2021-08-05 NOTE — Telephone Encounter (Signed)
Transition Care Management Follow-up Telephone Call Date of discharge and from where: 08/02/2021-[place]  How have you been since you were released from the hospital? Patient stated she is doing better.  Any questions or concerns? No  Items Reviewed: Did the pt receive and understand the discharge instructions provided? Yes  Medications obtained and verified? Yes  Other? No  Any new allergies since your discharge? No  Dietary orders reviewed? No Do you have support at home? Yes   Home Care and Equipment/Supplies: Were home health services ordered? not applicable If so, what is the name of the agency? N/A  Has the agency set up a time to come to the patient's home? not applicable Were any new equipment or medical supplies ordered?  No What is the name of the medical supply agency? N/A Were you able to get the supplies/equipment? not applicable Do you have any questions related to the use of the equipment or supplies? No  Functional Questionnaire: (I = Independent and D = Dependent) ADLs: I  Bathing/Dressing- I  Meal Prep- I  Eating- I  Maintaining continence- I  Transferring/Ambulation- I  Managing Meds- I  Follow up appointments reviewed:  PCP Hospital f/u appt confirmed? No   Specialist Hospital f/u appt confirmed? No   Are transportation arrangements needed? No  If their condition worsens, is the pt aware to call PCP or go to the Emergency Dept.? Yes Was the patient provided with contact information for the PCP's office or ED? Yes Was to pt encouraged to call back with questions or concerns? Yes

## 2021-08-12 ENCOUNTER — Telehealth: Payer: Self-pay

## 2021-08-12 NOTE — Telephone Encounter (Signed)
° °  Telephone encounter was:  Successful.  08/12/2021 Name: Brandi Henson MRN: [id number] DOB: 1996-03-20  Brandi Henson is a 26 y.o. year old female who is a primary care patient of Patient, No Pcp Per (Inactive) . The community resource team was consulted for assistance with  PCP Information  Care guide performed the following interventions: Patient provided with information about care guide support team and interviewed to confirm resource needs.Patient called back and requested pcp information to be emailed to her  Follow Up Plan:  No further follow up planned at this time. The patient has been provided with needed resources.    Lenard Forth Care Guide, Embedded Care Coordination [facility], Care Management  [phone] [address] [place], [place], [place] [address] Phone: [phone] Email: [email or web address]

## 2021-08-12 NOTE — Telephone Encounter (Signed)
° °  Telephone encounter was:  Unsuccessful.  08/12/2021 Name: Brandi Henson MRN: [id number] DOB: 05-05-1996  Unsuccessful outbound call made today to assist with:   PCP Information  Outreach Attempt:  1st Attempt  A HIPAA compliant voice message was left requesting a return call.  Instructed patient to call back at [phone] at earliest convenience.    Lenard Forth Care Guide, Embedded Care Coordination [facility], Care Management  [phone] [address] [place], [place], [place] [address] Phone: [phone] Email: [email or web address]

## 2021-08-20 IMAGING — US US OB COMP LESS 14 WK
1 series · 15 of 23 positions shown · non-contrast
Comparison: None.

CLINICAL DATA: Right lower quadrant pain and positive pregnancy
test

EXAM:
OBSTETRIC <14 WK ULTRASOUND
TECHNIQUE: Transabdominal ultrasound was performed for evaluation of the
gestation as well as the maternal uterus and adnexal regions.

[Series 1: us ob comp less 14 wk · 15 of 23 slices shown]
[im 1/23]
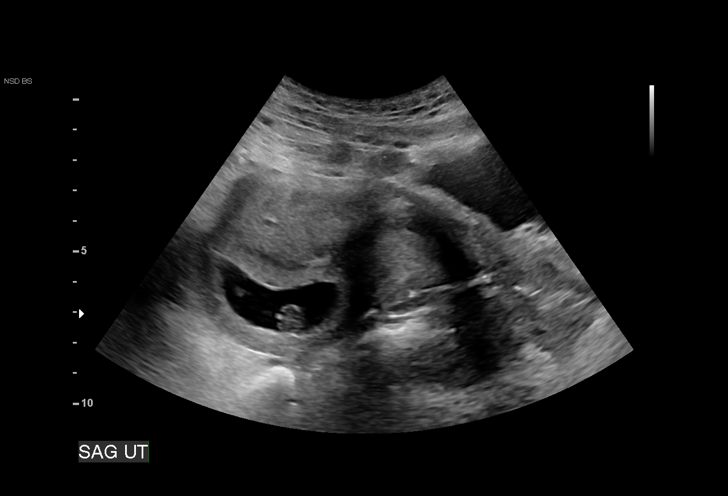
[im 3/23]
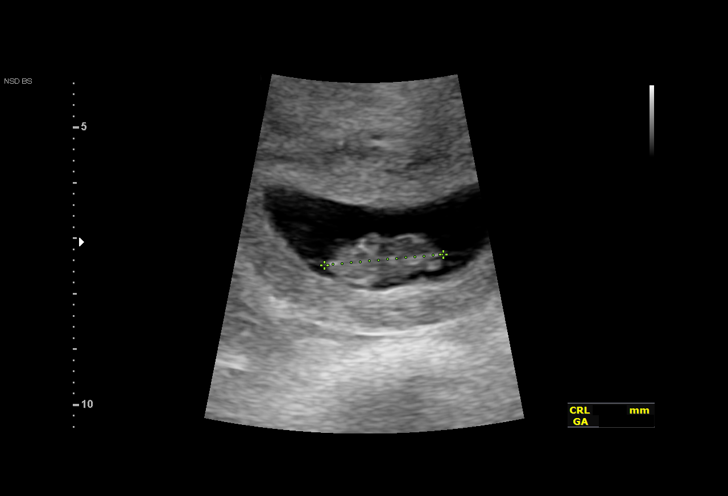
[im 4/23]
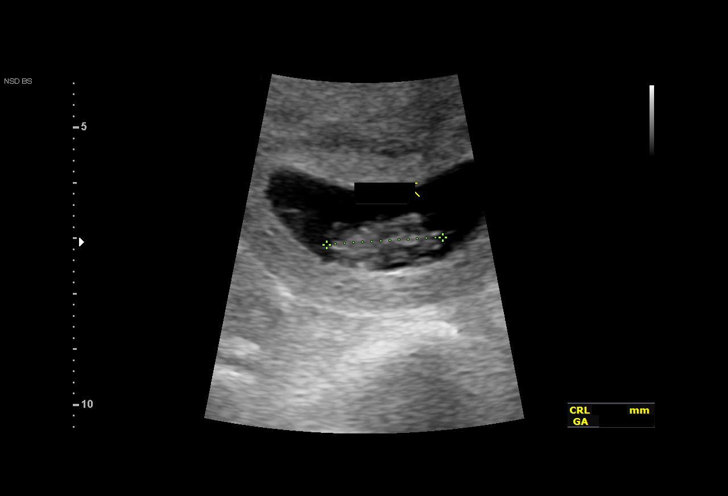
[im 6/23]
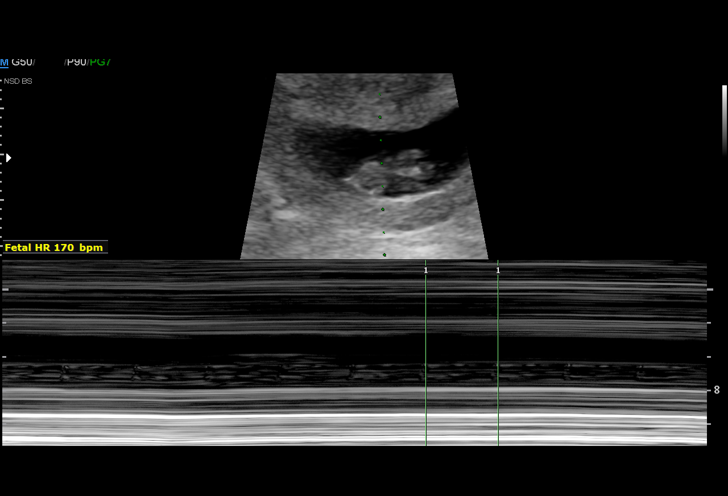
[im 7/23]
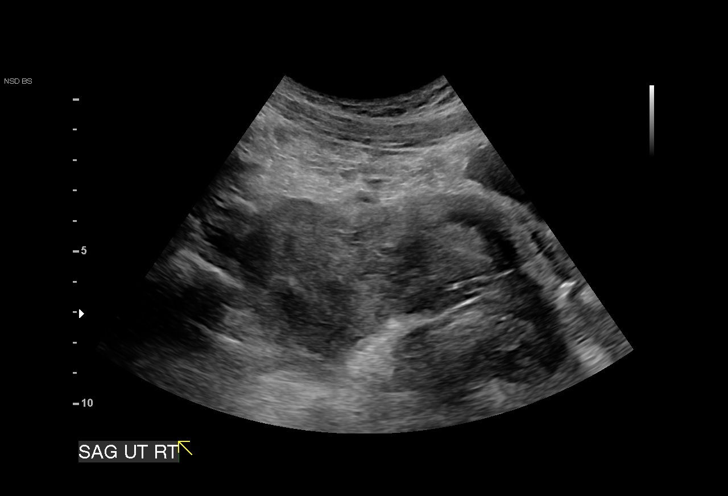
[im 9/23]
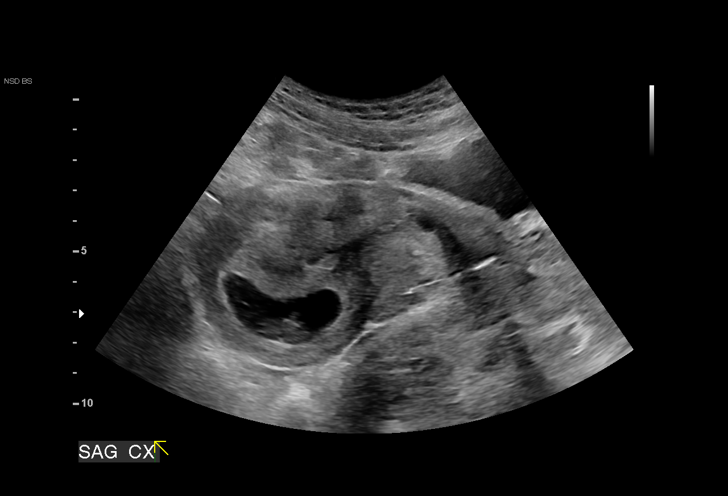
[im 10/23]
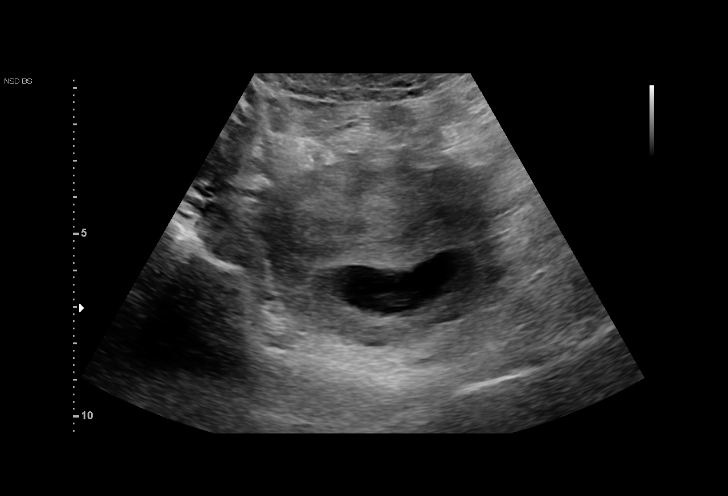
[im 12/23]
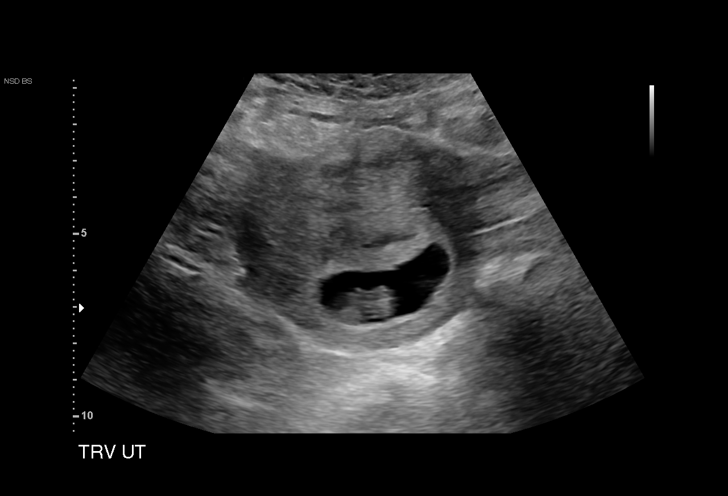
[im 14/23]
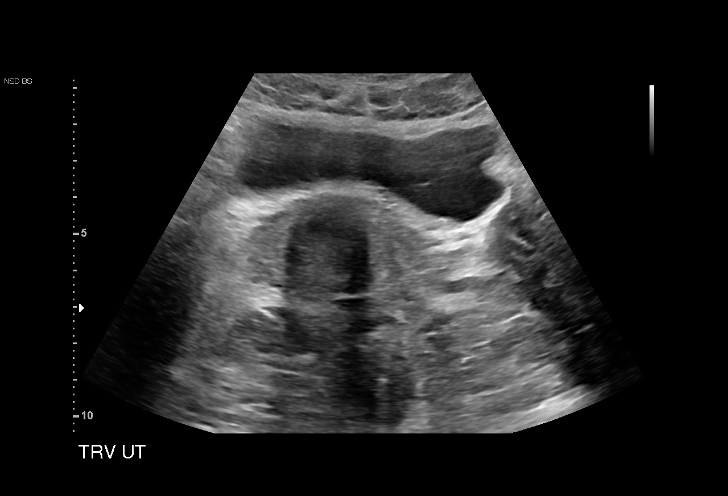
[im 15/23]
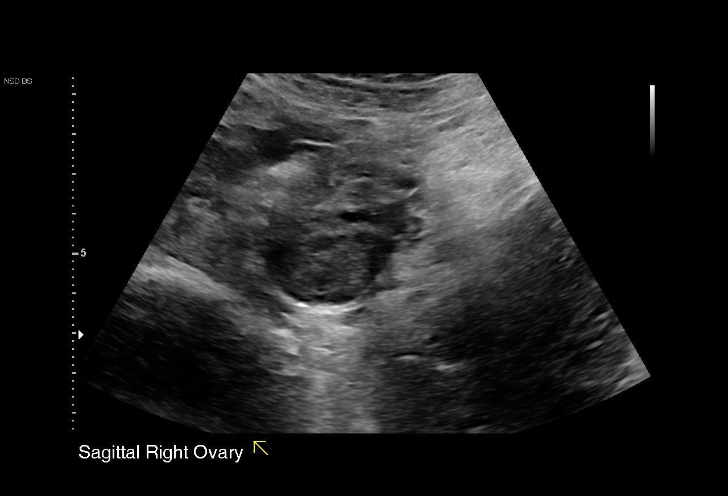
[im 17/23]
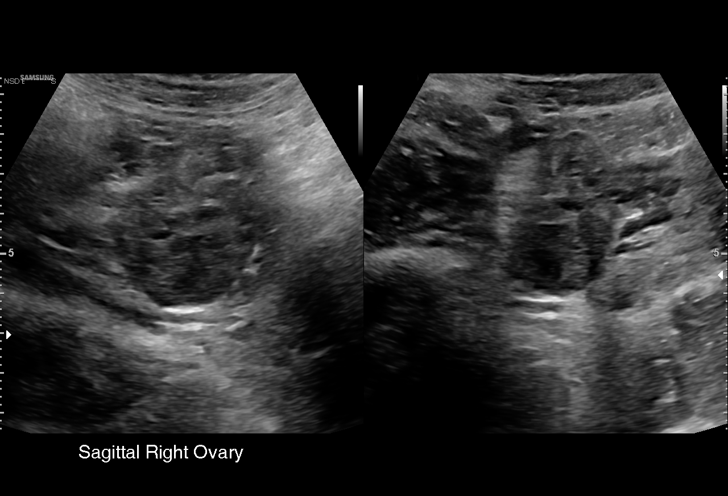
[im 18/23]
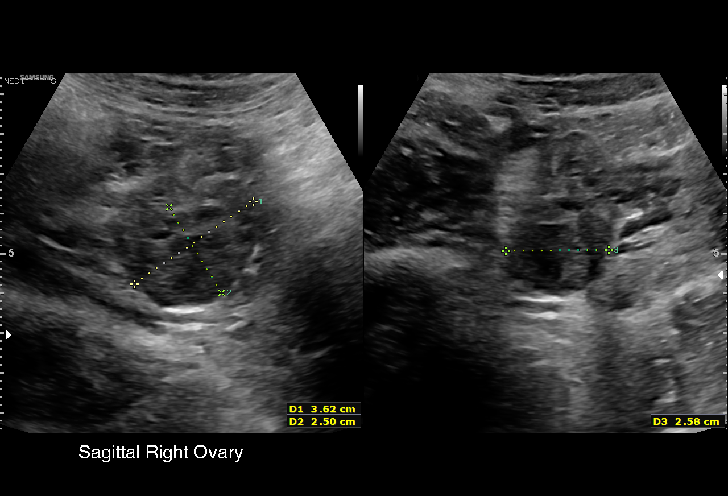
[im 20/23]
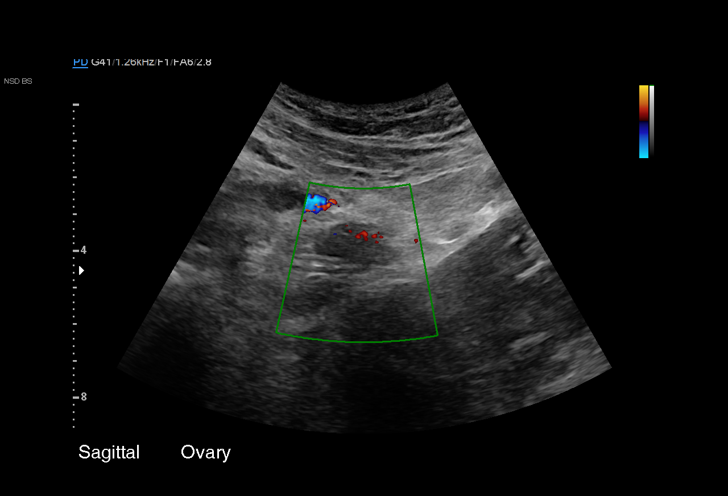
[im 21/23]
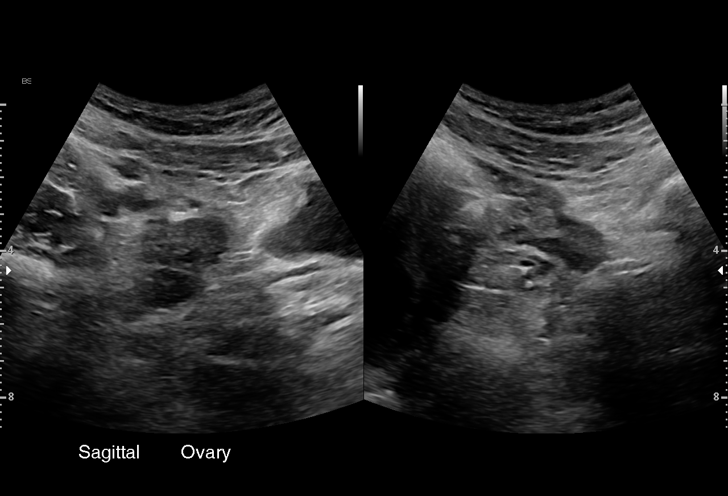
[im 23/23]
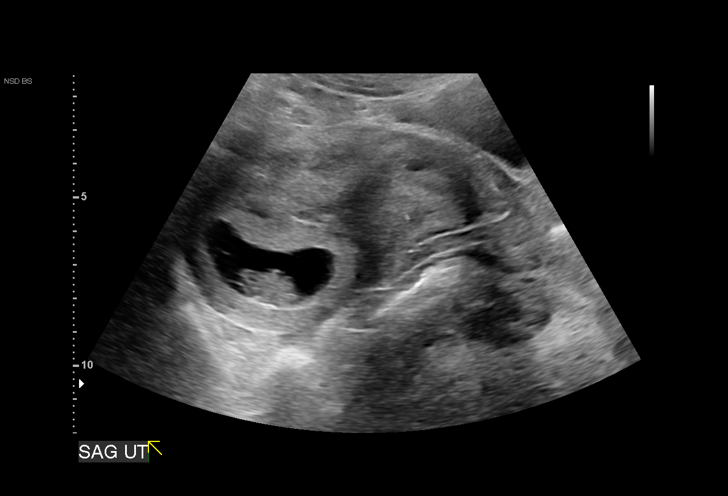

[15 of 23 positions shown; findings below may reference images not displayed]

FINDINGS: Intrauterine gestational sac: Single

Yolk sac:  Present

Embryo:  Present

Cardiac Activity: Present

Heart Rate: 170 bpm

CRL:   21.6 mm   8 w 5 d                  US EDC: 01/09/2019

Subchorionic hemorrhage:  None visualized.

Maternal uterus/adnexae: Uterus is retroverted.
IMPRESSION: Single live intrauterine gestation at 8 weeks 5 days.

## 2021-08-25 ENCOUNTER — Other Ambulatory Visit: Payer: Self-pay | Admitting: Obstetrics and Gynecology

## 2021-08-25 DIAGNOSIS — Z3042 Encounter for surveillance of injectable contraceptive: Secondary | ICD-10-CM

## 2021-09-02 ENCOUNTER — Ambulatory Visit: Payer: Medicaid Other

## 2021-09-10 ENCOUNTER — Ambulatory Visit (INDEPENDENT_AMBULATORY_CARE_PROVIDER_SITE_OTHER): Payer: Medicaid Other

## 2021-09-10 ENCOUNTER — Other Ambulatory Visit: Payer: Self-pay

## 2021-09-10 VITALS — BP 128/80 | HR 90 | Ht 69.0 in | Wt 171.3 lb

## 2021-09-10 DIAGNOSIS — Z3042 Encounter for surveillance of injectable contraceptive: Secondary | ICD-10-CM

## 2021-09-10 MED ORDER — MEDROXYPROGESTERONE ACETATE 150 MG/ML IM SUSP
150.0000 mg | Freq: Once | INTRAMUSCULAR | Status: AC
  Administered 2021-09-10: 150 mg via INTRAMUSCULAR

## 2021-09-10 NOTE — Progress Notes (Signed)
Patient presents for depo injection. Patient is one day outside of her window. Injection given per protocol. Tolerated well, right upper outer quadrant.

## 2021-10-31 IMAGING — US US MFM OB COMP +14 WKS
1 series · 13 of 28 positions shown · non-contrast
Comparison: none

[Series 1: us mfm ob comp +14 wks · 74 acquisitions, 13 frames shown]
[im 3/74]
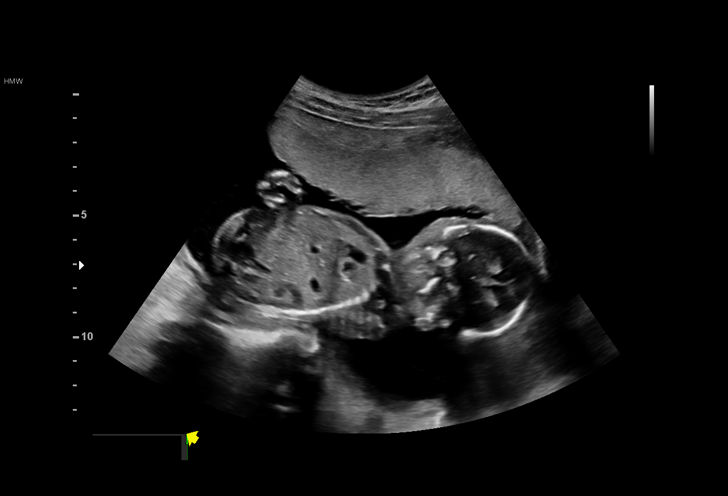
[im 9/74]
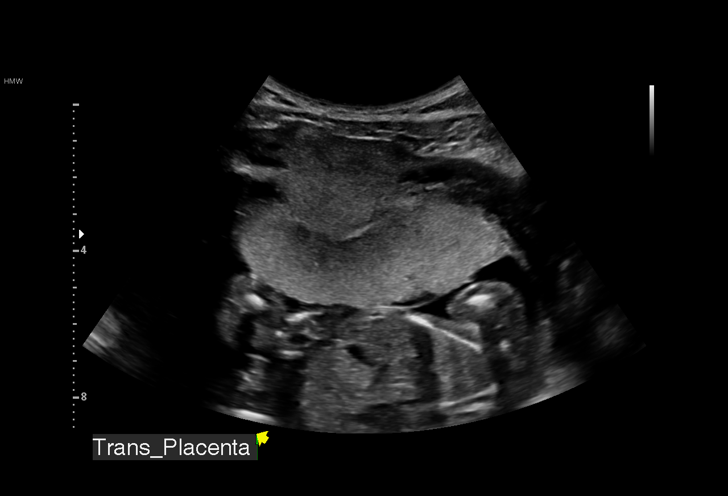
[im 14/74]
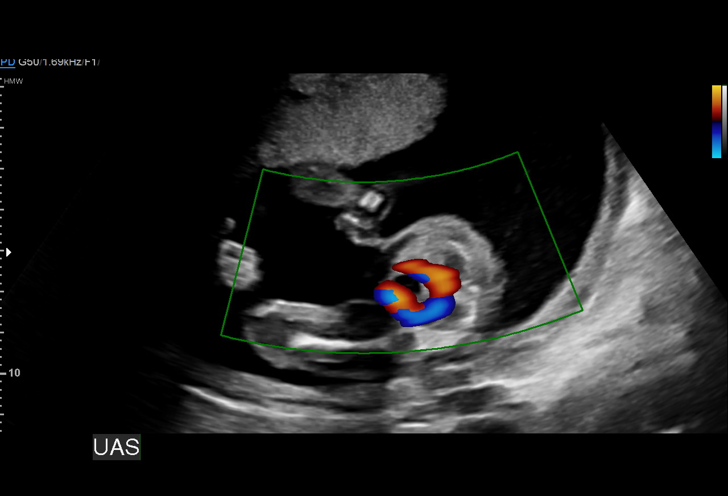
[im 19/74]
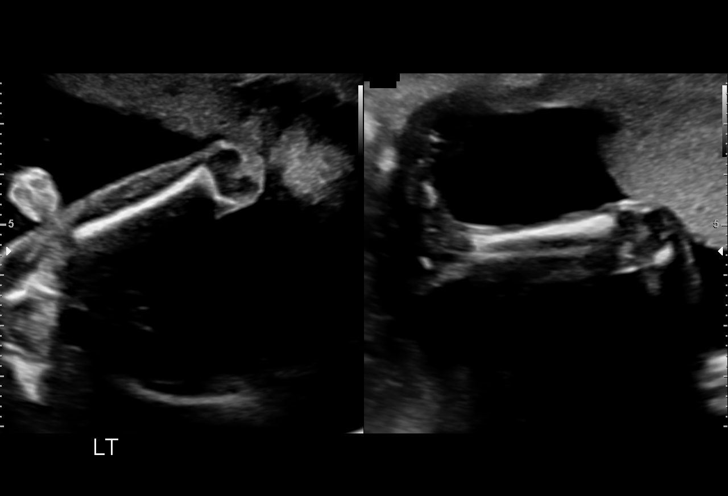
[im 25/74]
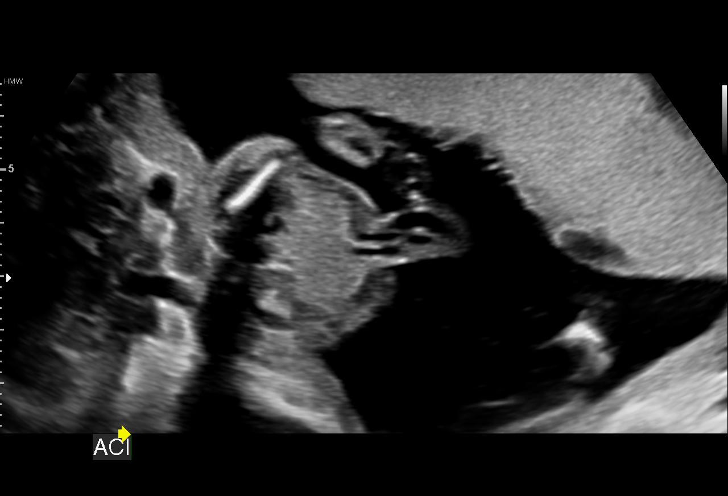
[im 30/74]
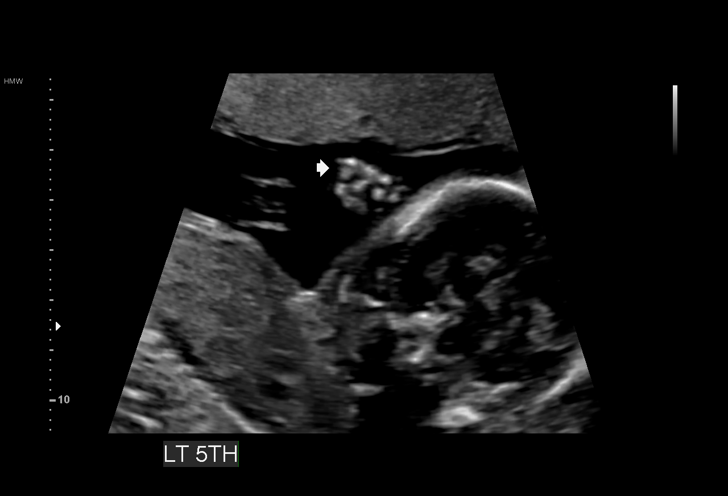
[im 38/74]
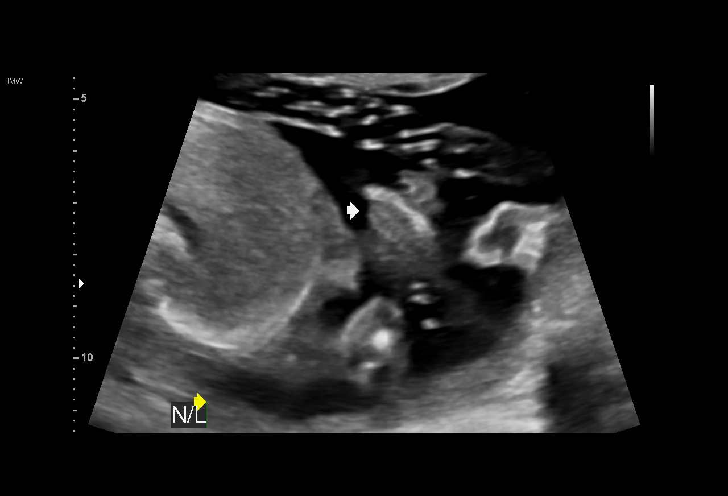
[im 44/74]
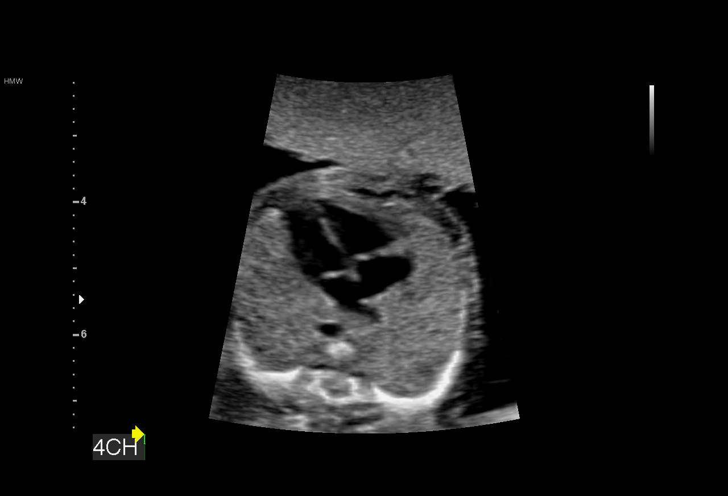
[im 49/74]
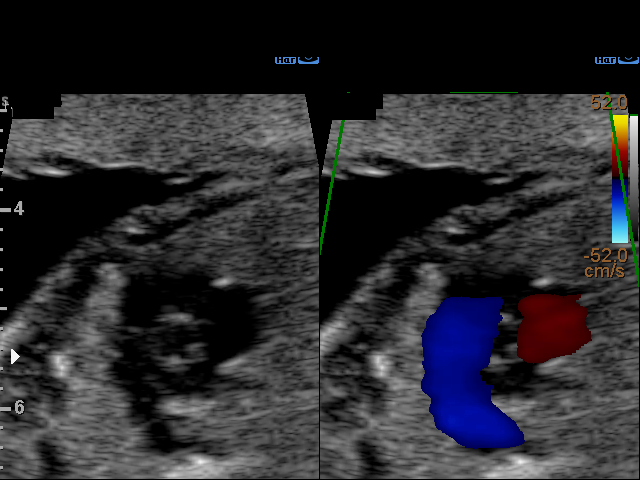
[im 55/74]
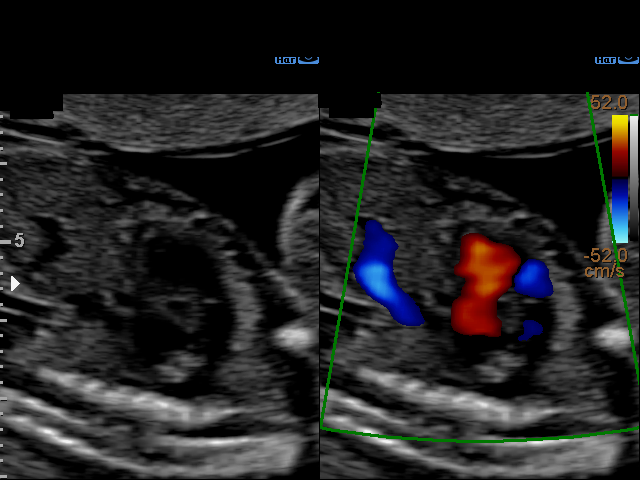
[im 60/74]
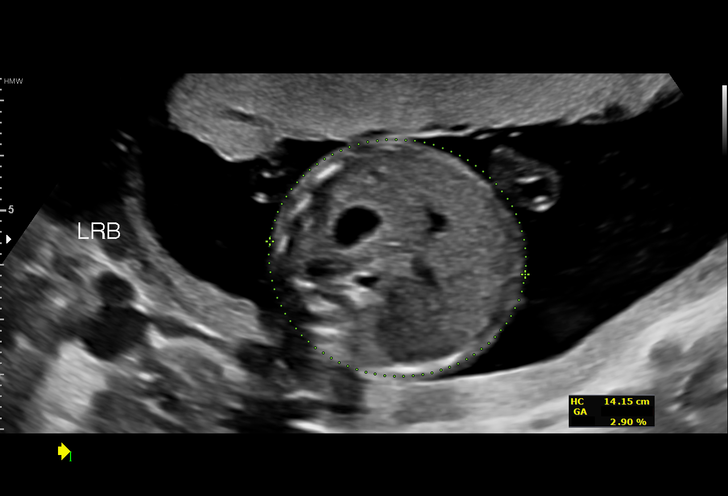
[im 65/74]
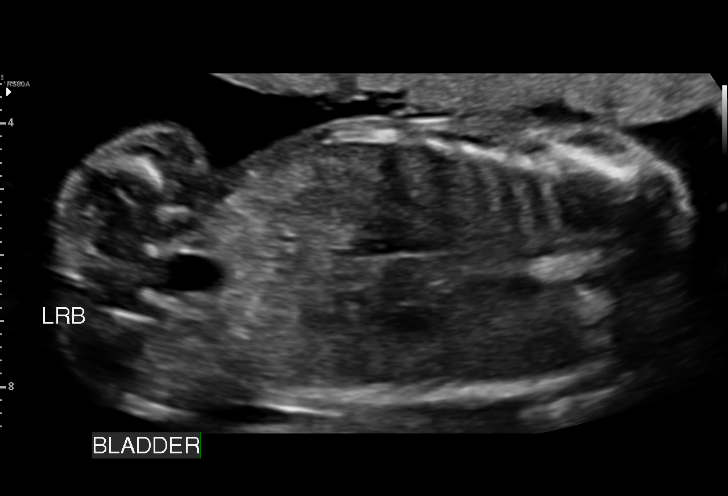
[im 71/74]
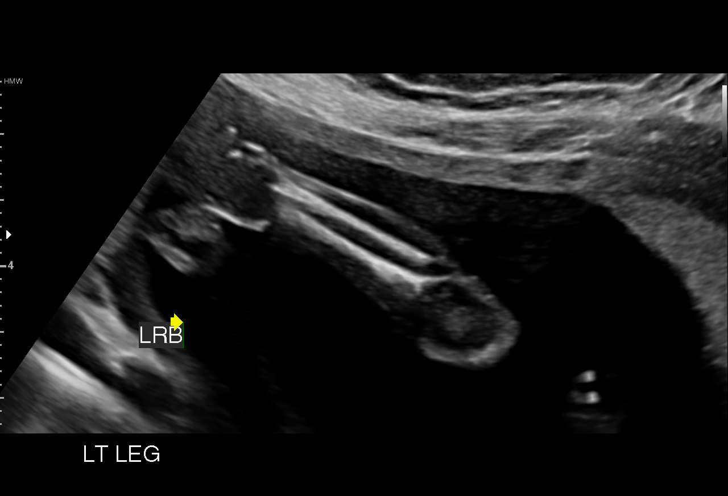

[13 of 28 positions shown; findings below may reference images not displayed]

----------------------------------------------------------------------

 ----------------------------------------------------------------------
Indications

  18 weeks gestation of pregnancy
  Encounter for antenatal screening for
  malformations (low risk NIPS, N.KUU, neg
  AFP)
 ----------------------------------------------------------------------
Fetal Evaluation

 Num Of Fetuses:          1
 Cardiac Activity:        Observed
 Presentation:            Cephalic
 Placenta:                Anterior
 P. Cord Insertion:       Visualized, central

 Amniotic Fluid
 AFI FV:      Within normal limits

                             Largest Pocket(cm)

Biometry

 BPD:      44.3  mm     G. Age:  19w 3d         79  %    CI:          77.5  %    70 - 86
                                                         FL/HC:       18.6  %    16.1 -
 HC:      159.3  mm     G. Age:  18w 5d         45  %    HC/AC:       1.09       1.09 -
 AC:      146.6  mm     G. Age:  20w 0d         84  %    FL/BPD:      66.8  %
 FL:       29.6  mm     G. Age:  19w 1d         59  %    FL/AC:       20.2  %    20 - 24
 HUM:      30.2  mm     G. Age:  20w 0d         84  %
 CER:      19.4  mm     G. Age:  18w 5d         50  %
 CM:          4  mm

 Est. FW:     295   gm   0 lb 10 oz      87  %
OB History

 Gravidity:    2         Term:   1        Prem:   0        SAB:   0
 TOP:          0       Ectopic:  0        Living: 1
Gestational Age

 LMP:           18w 5d        Date:  04/06/19                 EDD:   01/11/20
 U/S Today:     19w 2d                                        EDD:   01/07/20
 Best:          18w 5d     Det. By:  LMP  (04/06/19)          EDD:   01/11/20
Anatomy

 Cranium:               Appears normal         LVOT:                   Appears normal
 Cavum:                 Appears normal         Aortic Arch:            Not well visualized
 Ventricles:            Appears normal         Ductal Arch:            Appears normal
 Choroid Plexus:        Appears normal         Diaphragm:              Appears normal
 Cerebellum:            Appears normal         Stomach:                Appears normal, left
                                                                       sided
 Posterior Fossa:       Appears normal         Abdomen:                Appears normal
 Nuchal Fold:           Appears normal         Abdominal Wall:         Appears nml (cord
                                                                       insert, abd wall)
 Face:                  Appears normal         Cord Vessels:           Appears normal (3
                        (orbits and profile)                           vessel cord)
 Lips:                  Appears normal         Kidneys:                Appear normal
 Palate:                Appears normal         Bladder:                Appears normal
 Thoracic:              Appears normal         Spine:                  Not well visualized
 Heart:                 Appears normal         Upper Extremities:      Appears normal
                        (4CH, axis, and
                        situs)
 RVOT:                  Appears normal         Lower Extremities:      Appears normal

 Other:  Fetus appears to be a male. Heels visualized. Nasal bone visualized.
         Technically difficult due to fetal position.
Cervix Uterus Adnexa

 Cervix
 Length:            3.1  cm.
 Normal appearance by transabdominal scan.

 Uterus
 No abnormality visualized.

 Left Ovary
 No adnexal mass visualized.

 Right Ovary
 No adnexal mass visualized.

 Cul De Sac
 No free fluid seen.

 Adnexa
 No abnormality visualized.
Impression

 Normal anatomy however, due to fetal position suboptimal
 views of the fetal anatomy was obtained.
 Normal fetal movement and amniotic fluid
Recommendations

 Follow up growth in 4 weeks.

## 2021-11-13 IMAGING — US US MFM OB TRANSVAGINAL
1 series · 15 of 28 positions shown · non-contrast
Comparison: none

[Series 1: us mfm ob transvaginal · 15 of 61 slices shown]
[im 1/61]
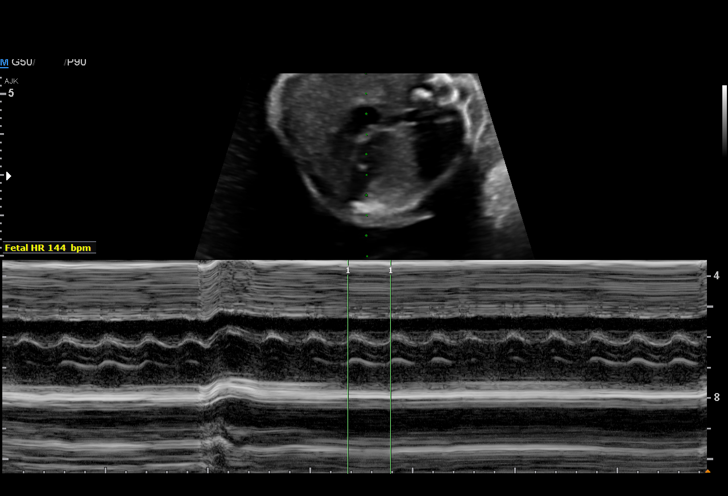
[im 5/61]
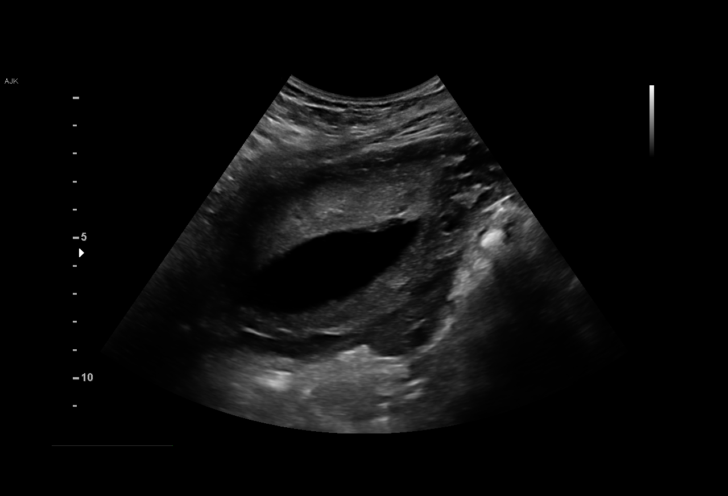
[im 9/61]
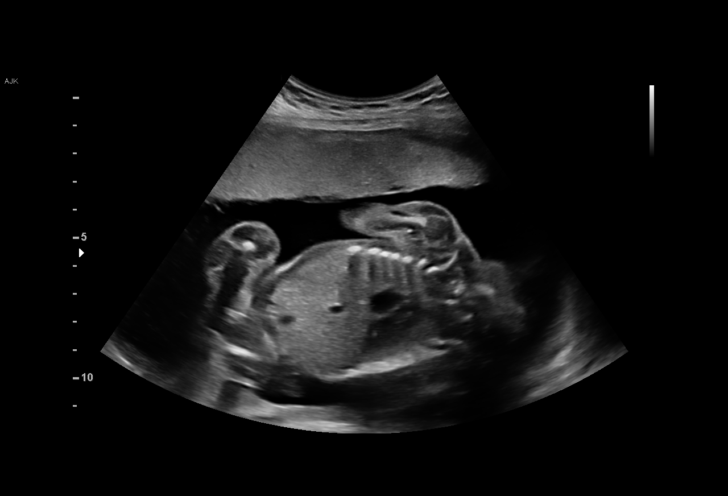
[im 14/61]
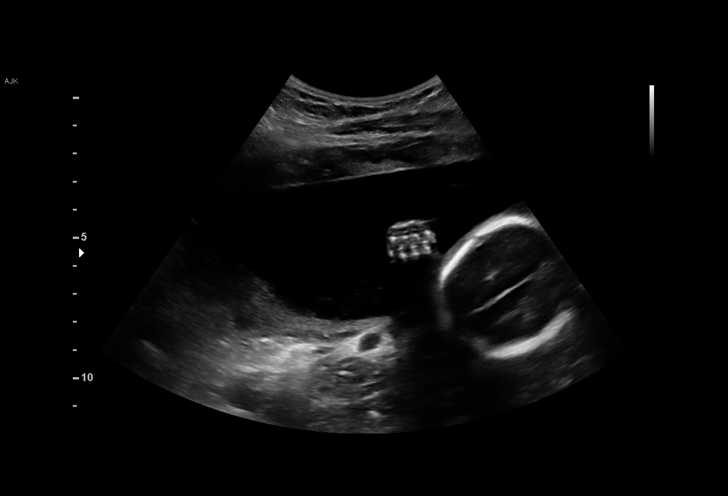
[im 18/61]
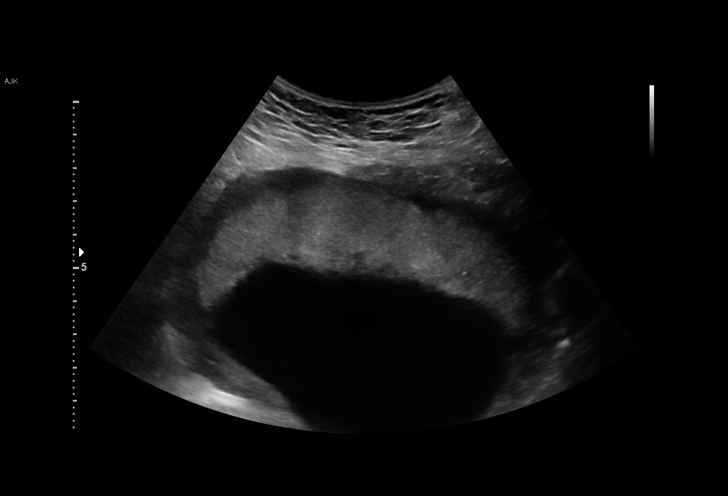
[im 23/61]
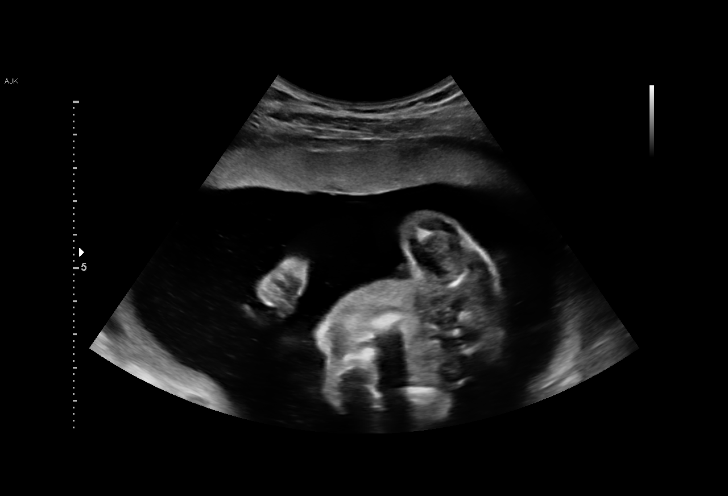
[im 27/61]
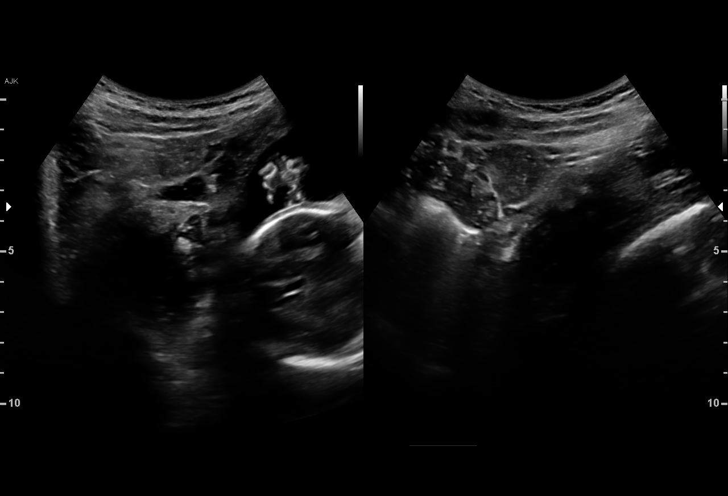
[im 32/61]
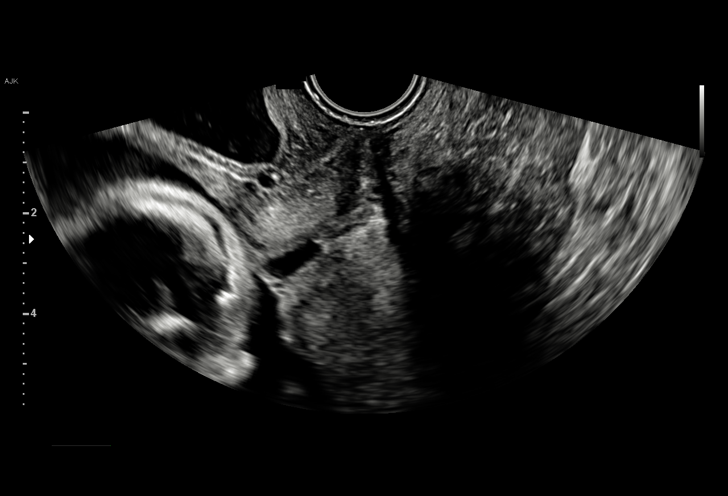
[im 34/61]
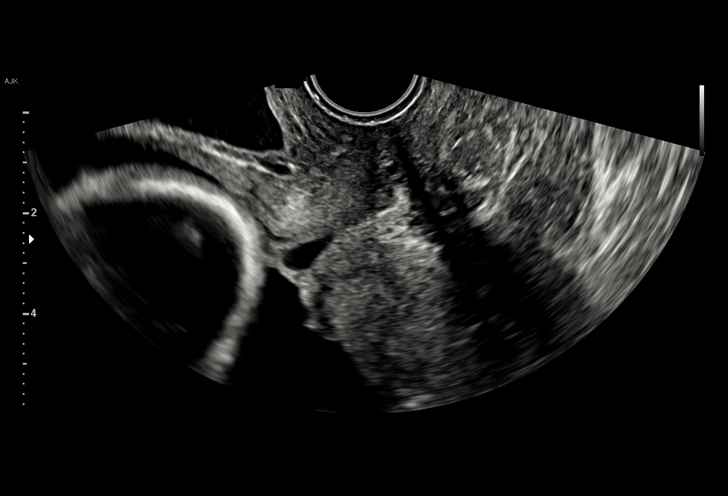
[im 38/61]
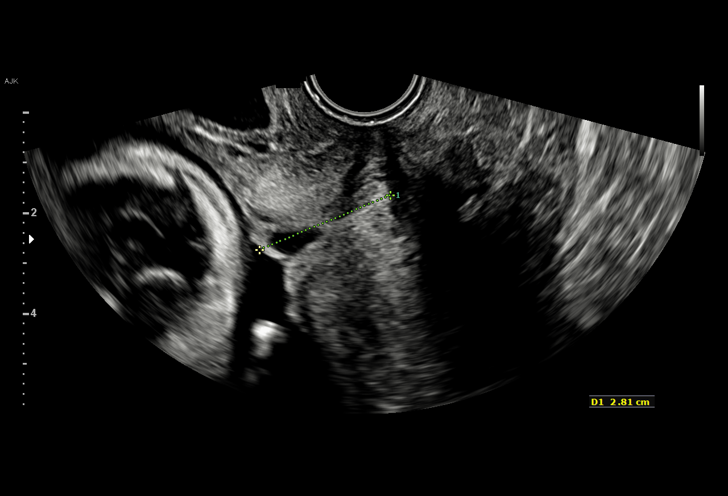
[im 43/61]
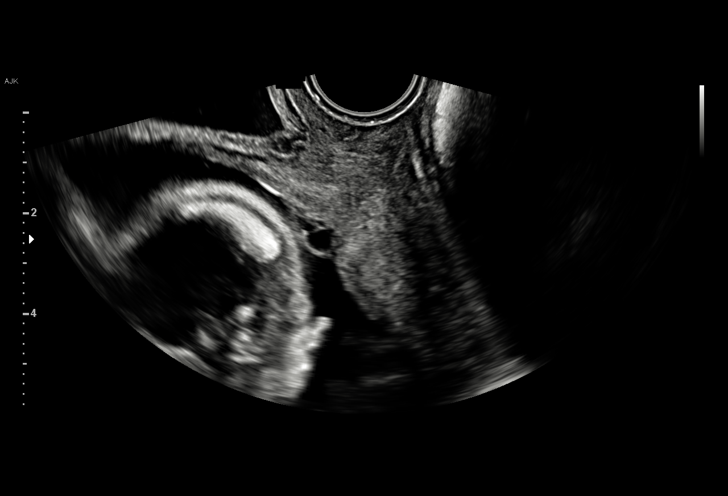
[im 47/61]
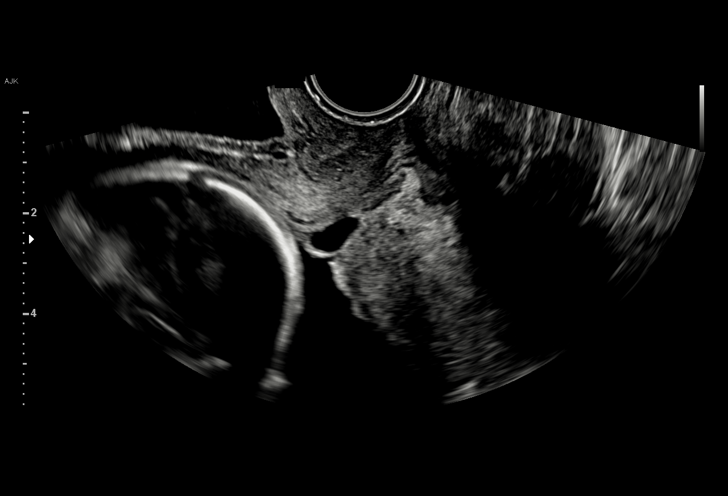
[im 52/61]
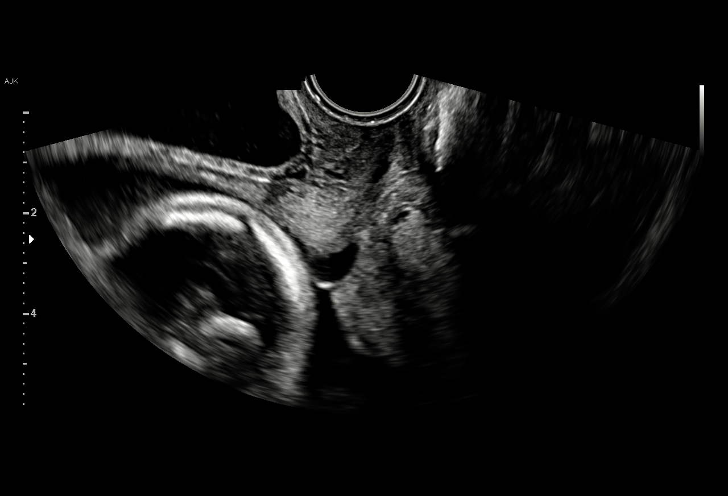
[im 56/61]
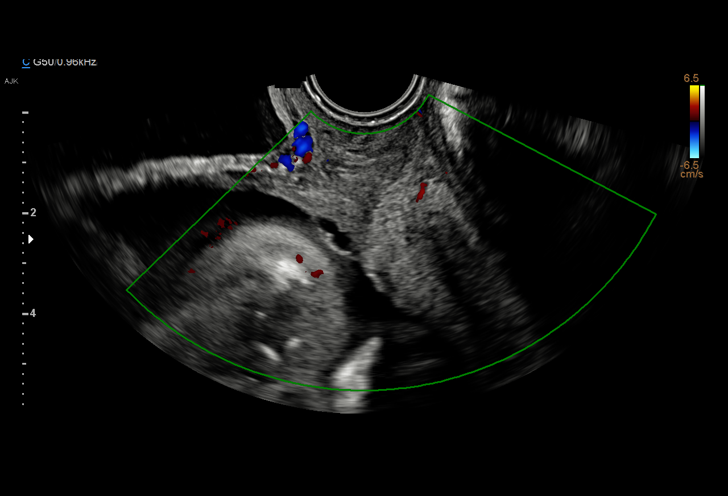
[im 61/61]
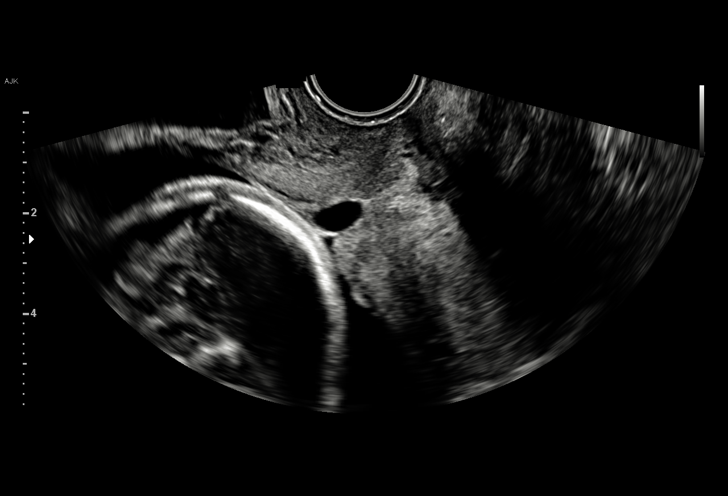

[15 of 28 positions shown; findings below may reference images not displayed]

----------------------------------------------------------------------

 ----------------------------------------------------------------------
Indications

  Pelvic pain affecting pregnancy in second
  trimester (pressure)
  20 weeks gestation of pregnancy
 ----------------------------------------------------------------------
Fetal Evaluation

 Num Of Fetuses:          1
 Fetal Heart Rate(bpm):   144
 Cardiac Activity:        Observed
 Presentation:            Cephalic
 Placenta:                Anterior
 P. Cord Insertion:       Visualized, central

 Amniotic Fluid
 AFI FV:      Subjectively upper-normal

                             Largest Pocket(cm)


 Comment:    Stomach, bladder, and diaphragm noted.
OB History

 Gravidity:    2         Term:   1        Prem:   0        SAB:   0
 TOP:          0       Ectopic:  0        Living: 1
Gestational Age

 LMP:           20w 4d        Date:  04/06/19                 EDD:   01/11/20
 Best:          20w 4d     Det. By:  LMP  (04/06/19)          EDD:   01/11/20
Cervix Uterus Adnexa

 Cervix
 Length:           2.57  cm.
 Measured transvaginally.

 Uterus
 No abnormality visualized.

 Left Ovary
 Not visualized.

 Right Ovary
 Not visualized.

 Adnexa
 No abnormality visualized.
Comments

 This patient presented to the [facility] due to lower abdominal
 cramping. She has a prior full term birth.

 On a transvaginal ultrasound performed today, her cervical
 length measured 2.57 cm long without any signs of funneling.
 There was a nabothian cyst noted in the cervix at the level of
 the internal os.

 The patient should have a follow up cervical length scheduled
 in one week to determine if any further treatment is
 necessary.

## 2021-11-19 DIAGNOSIS — Z113 Encounter for screening for infections with a predominantly sexual mode of transmission: Secondary | ICD-10-CM | POA: Diagnosis not present

## 2021-11-19 DIAGNOSIS — Z114 Encounter for screening for human immunodeficiency virus [HIV]: Secondary | ICD-10-CM | POA: Diagnosis not present

## 2021-11-19 DIAGNOSIS — Z0389 Encounter for observation for other suspected diseases and conditions ruled out: Secondary | ICD-10-CM | POA: Diagnosis not present

## 2021-11-20 IMAGING — US US MFM OB TRANSVAGINAL
1 series · 14 of 28 positions shown · non-contrast
Comparison: none

[Series 1: us mfm ob transvaginal · 58 acquisitions, 14 frames shown]
[im 3/58]
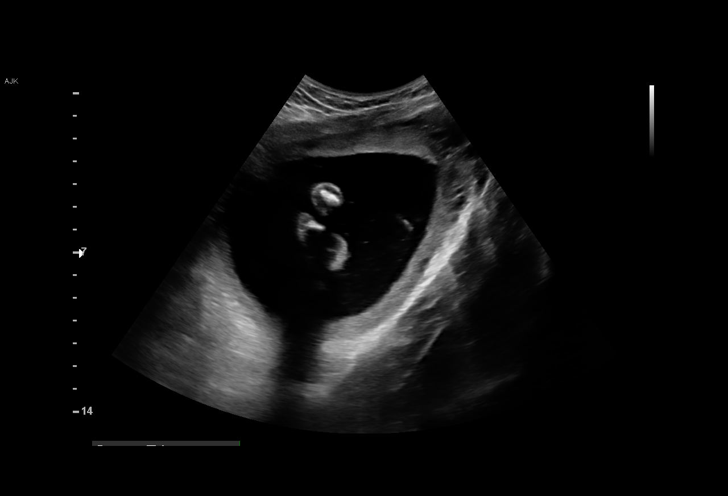
[im 7/58]
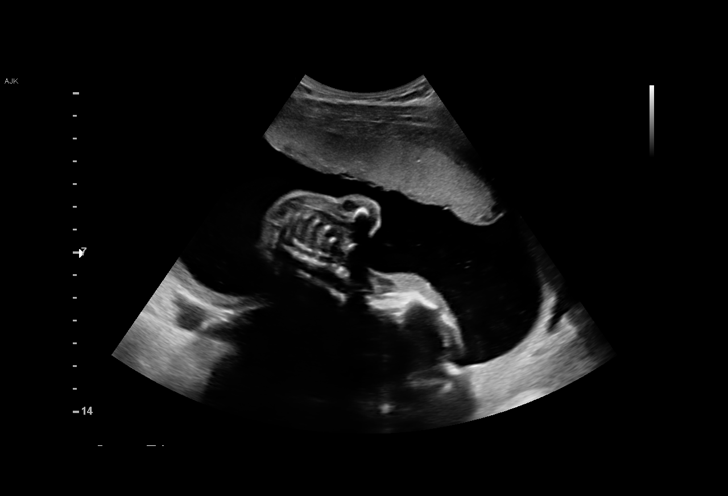
[im 11/58]
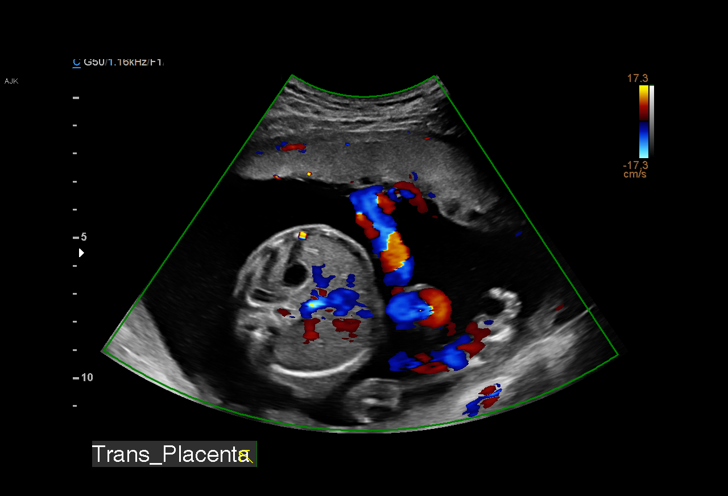
[im 15/58]
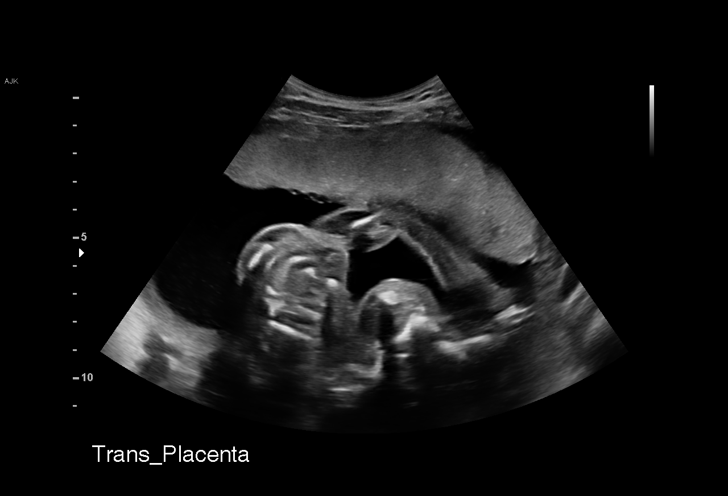
[im 20/58]
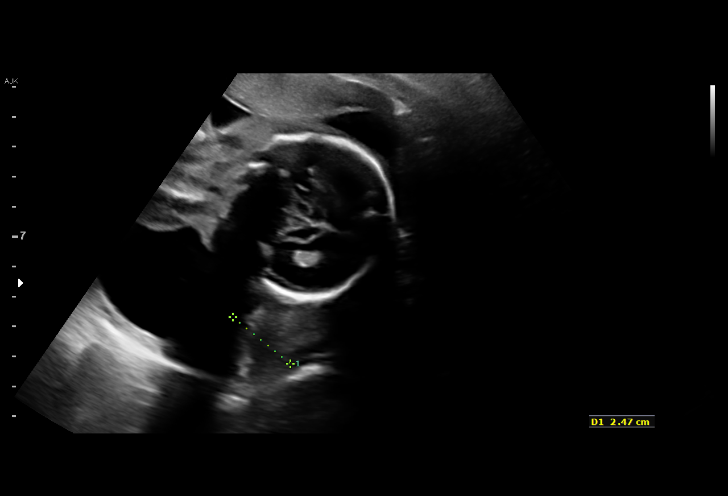
[im 24/58]
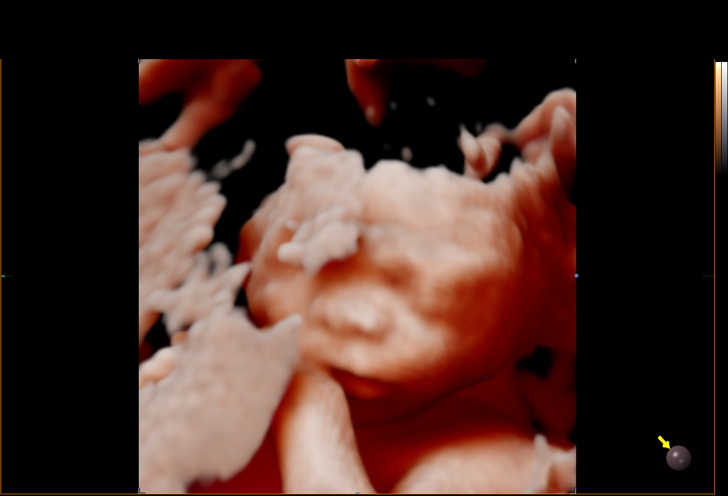
[im 28/58]
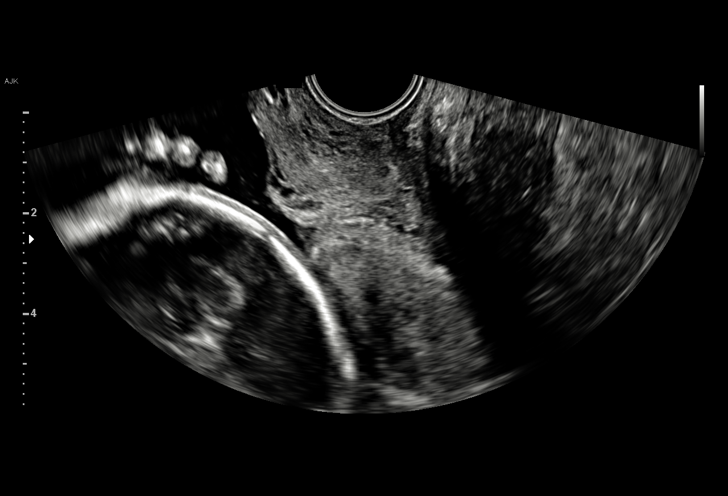
[im 32/58]
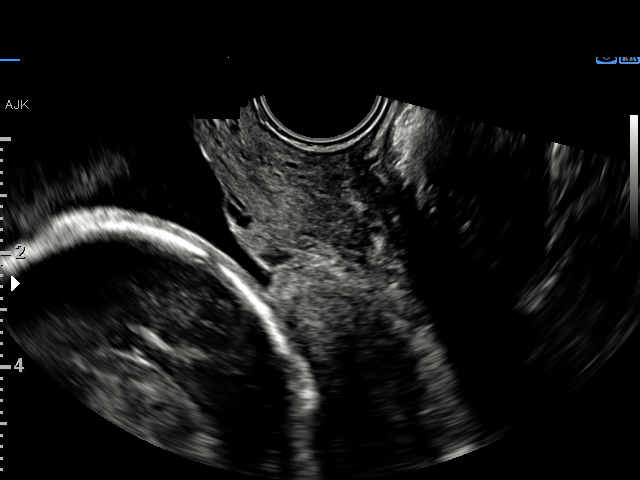
[im 36/58]
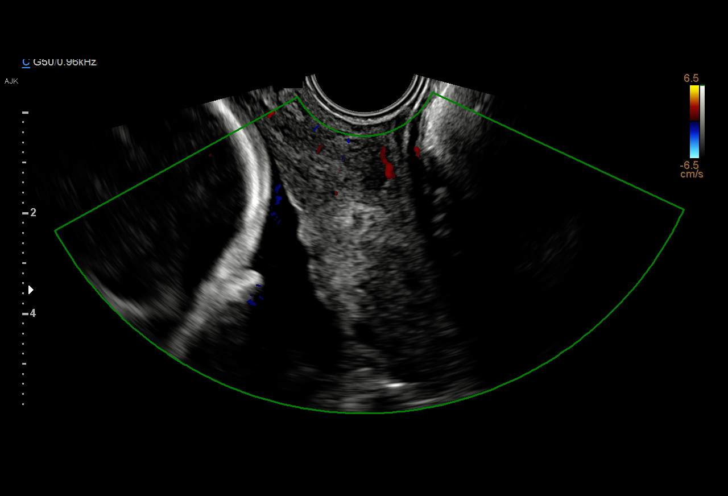
[im 41/58]
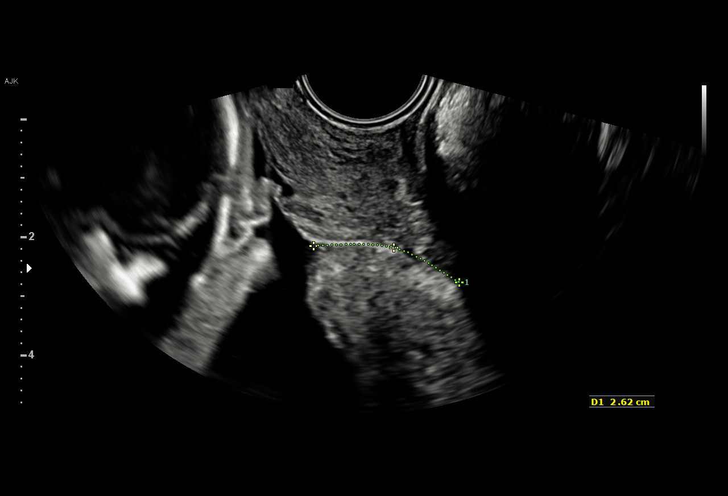
[im 45/58]
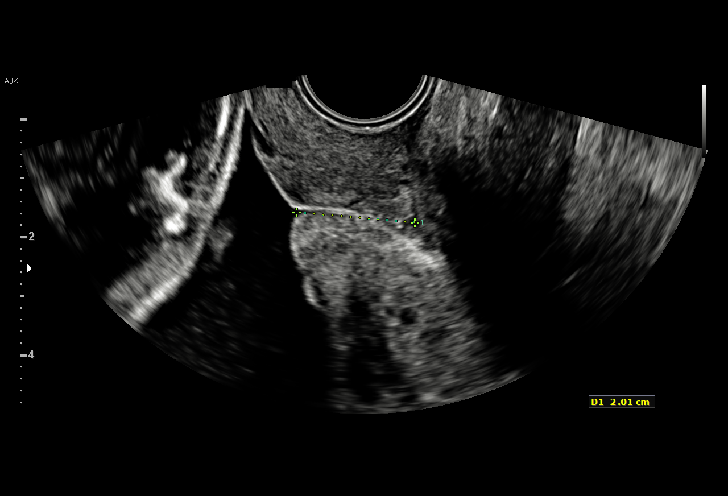
[im 49/58]
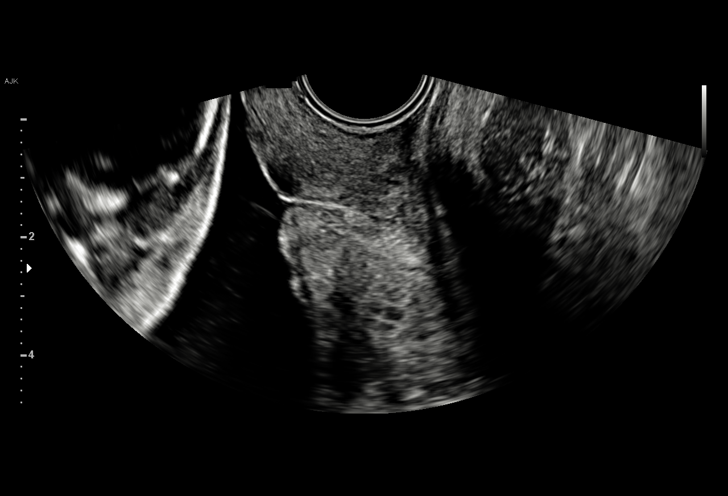
[im 53/58]
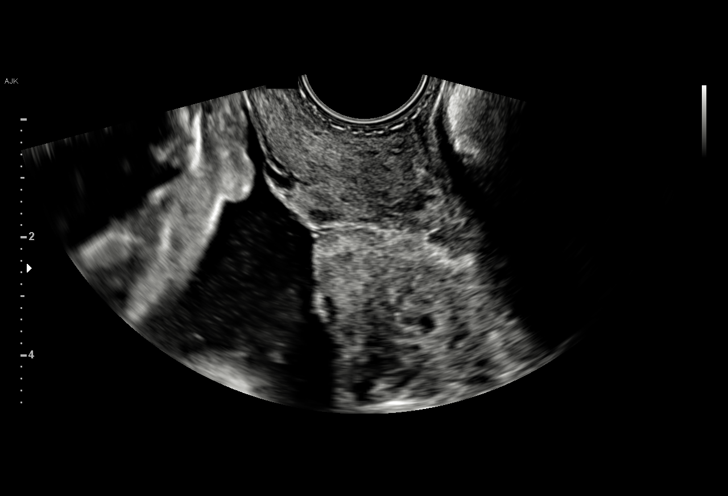
[im 58/58]
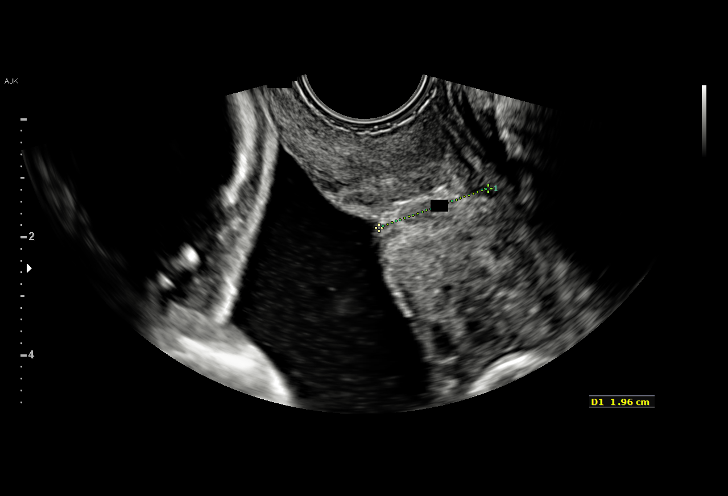

[14 of 28 positions shown; findings below may reference images not displayed]

----------------------------------------------------------------------

 ----------------------------------------------------------------------
Indications

  Cervical shortening, second trimester
  21 weeks gestation of pregnancy
 ----------------------------------------------------------------------
Fetal Evaluation

 Num Of Fetuses:         1
 Fetal Heart Rate(bpm):  144
 Cardiac Activity:       Observed
 Presentation:           Cephalic
 Placenta:               Anterior
 P. Cord Insertion:      Visualized, central

 Amniotic Fluid
 AFI FV:      Within normal limits

                             Largest Pocket(cm)


 Comment:    Stomach, bladder, and diaphragm noted.
OB History

 Gravidity:    2         Term:   1        Prem:   0        SAB:   0
 TOP:          0       Ectopic:  0        Living: 1
Gestational Age

 LMP:           21w 4d        Date:  04/06/19                 EDD:   01/11/20
 Best:          21w 4d     Det. By:  LMP  (04/06/19)          EDD:   01/11/20
Cervix Uterus Adnexa

 Cervix
 Measured transvaginally. Dynamic (2.0-2.5cm)

 Uterus
 No abnormality visualized.

 Left Ovary
 Not visualized.

 Right Ovary
 Not visualized.

 Adnexa
 No abnormality visualized.
Comments

 This patient was seen for a cervical length measurement as
 she presented to the [facility] week complaining of lower
 abdominal cramping and lower abdominal pressure.  A
 borderline cervical length measurement of 2.57 cm was noted
 during that exam.  The patient reports that she continues to
 experience lower abdominal pressure.  She reports 1 prior
 full-term vaginal delivery.

 On a transvaginal ultrasound performed today, a dynamic
 cervix of between 2.0 to 2.5 cm long was noted.  There were
 no signs of cervical funneling noted today.

 Due to her continued complaints of lower abdominal
 cramping and pelvic pressure along with the sonographically
 detected shortened cervix, daily vaginal progesterone
 (Lon 200 mg daily) was prescribed with the hopes
 that the medication would decrease her risk of a preterm
 birth.  The patient was advised that as there were no signs of
 cervical funneling, I believe that her risk of a preterm birth is
 low.

 A follow-up growth scan and cervical length measurement
 was scheduled in 2 weeks.

## 2021-11-26 ENCOUNTER — Ambulatory Visit (INDEPENDENT_AMBULATORY_CARE_PROVIDER_SITE_OTHER): Payer: Medicaid Other | Admitting: Emergency Medicine

## 2021-11-26 VITALS — BP 131/82 | HR 91 | Ht 70.0 in | Wt 173.0 lb

## 2021-11-26 DIAGNOSIS — Z3042 Encounter for surveillance of injectable contraceptive: Secondary | ICD-10-CM

## 2021-11-26 MED ORDER — MEDROXYPROGESTERONE ACETATE 150 MG/ML IM SUSP
150.0000 mg | Freq: Once | INTRAMUSCULAR | Status: AC
  Administered 2021-11-26: 150 mg via INTRAMUSCULAR

## 2021-11-26 NOTE — Progress Notes (Signed)
Date last pap: 04/24/21. ?Last Depo-Provera: 09/10/21. ?Side Effects if any: NA. ?Serum HCG indicated? NA. ?Depo-Provera 150 mg IM given by: Resa Miner, RN into [facility] tolerated well, no adverse reactions. ?Next appointment due Jul 19- Aug 2.  ?

## 2022-02-19 ENCOUNTER — Ambulatory Visit (INDEPENDENT_AMBULATORY_CARE_PROVIDER_SITE_OTHER): Payer: Medicaid Other | Admitting: *Deleted

## 2022-02-19 DIAGNOSIS — Z3042 Encounter for surveillance of injectable contraceptive: Secondary | ICD-10-CM | POA: Diagnosis not present

## 2022-02-19 MED ORDER — MEDROXYPROGESTERONE ACETATE 150 MG/ML IM SUSP
150.0000 mg | Freq: Once | INTRAMUSCULAR | Status: AC
Start: 2022-02-19 — End: 2022-02-19
  Administered 2022-02-19: 150 mg via INTRAMUSCULAR

## 2022-02-19 NOTE — Progress Notes (Signed)
Date last pap: 04/24/21. Last Depo-Provera: 11/26/21. Side Effects if any: n/a. Serum HCG indicated? N/a. Depo-Provera 150 mg IM given by: S.Malen Gauze, CMA .   Next appointment due 10/12-10/26/23.  Administrations This Visit     medroxyPROGESTERone (DEPO-PROVERA) injection 150 mg     Admin Date 02/19/2022 Action Given Dose 150 mg Route Intramuscular Administered By Lanney Gins, CMA

## 2022-02-19 NOTE — Progress Notes (Signed)
Agree with nurses's documentation of this patient's clinic encounter.  Shamarion Coots L, MD  

## 2022-05-12 ENCOUNTER — Ambulatory Visit: Payer: Medicaid Other | Admitting: Obstetrics and Gynecology

## 2022-05-26 ENCOUNTER — Telehealth: Payer: Self-pay

## 2022-05-28 ENCOUNTER — Ambulatory Visit (INDEPENDENT_AMBULATORY_CARE_PROVIDER_SITE_OTHER): Payer: Medicaid Other | Admitting: Obstetrics

## 2022-05-28 ENCOUNTER — Encounter: Payer: Self-pay | Admitting: Obstetrics

## 2022-05-28 ENCOUNTER — Other Ambulatory Visit (HOSPITAL_COMMUNITY)
Admission: RE | Admit: 2022-05-28 | Discharge: 2022-05-28 | Disposition: A | Discharge status: Home / Self Care | Payer: Medicaid Other | Source: Ambulatory Visit | Attending: Obstetrics and Gynecology | Admitting: Obstetrics and Gynecology

## 2022-05-28 VITALS — BP 109/70 | HR 81 | Ht 70.0 in | Wt 168.0 lb

## 2022-05-28 DIAGNOSIS — N760 Acute vaginitis: Secondary | ICD-10-CM

## 2022-05-28 DIAGNOSIS — N898 Other specified noninflammatory disorders of vagina: Secondary | ICD-10-CM | POA: Insufficient documentation

## 2022-05-28 DIAGNOSIS — Z3042 Encounter for surveillance of injectable contraceptive: Secondary | ICD-10-CM

## 2022-05-28 DIAGNOSIS — B9689 Other specified bacterial agents as the cause of diseases classified elsewhere: Secondary | ICD-10-CM

## 2022-05-28 DIAGNOSIS — Z01419 Encounter for gynecological examination (general) (routine) without abnormal findings: Secondary | ICD-10-CM | POA: Diagnosis not present

## 2022-05-28 MED ORDER — METRONIDAZOLE 500 MG PO TABS: 500.0000 mg | ORAL_TABLET | Freq: Two times a day (BID) | ORAL | 2 refills | Status: DC

## 2022-05-28 MED ORDER — MEDROXYPROGESTERONE ACETATE 150 MG/ML IM SUSP
150.0000 mg | Freq: Once | INTRAMUSCULAR | Status: AC
  Administered 2022-05-28: 150 mg via INTRAMUSCULAR

## 2022-05-28 MED ORDER — MEDROXYPROGESTERONE ACETATE 150 MG/ML IM SUSP: 150.0000 mg | INTRAMUSCULAR | 3 refills | Status: DC

## 2022-05-28 NOTE — Patient Instructions (Signed)

## 2022-05-28 NOTE — Progress Notes (Signed)
Subjective:        Brandi Henson is a 26 y.o. female here for a routine exam.  Current complaints: Vaginal discharge .    Personal health questionnaire:  Is patient Ashkenazi Jewish, have a family history of breast and/or ovarian cancer: no Is there a family history of uterine cancer diagnosed at age < 18, gastrointestinal cancer, urinary tract cancer, family member who is a Personnel officer syndrome-associated carrier: no Is the patient overweight and hypertensive, family history of diabetes, personal history of gestational diabetes, preeclampsia or PCOS: no Is patient over 66, have PCOS,  family history of premature CHD under age 11, diabetes, smoke, have hypertension or peripheral artery disease:  no At any time, has a partner hit, kicked or otherwise hurt or frightened you?: no Over the past 2 weeks, have you felt down, depressed or hopeless?: no Over the past 2 weeks, have you felt little interest or pleasure in doing things?:no   Gynecologic History No LMP recorded. Patient has had an injection. Contraception: Depo-Provera injections Last Pap: 2022. Results were: normal Last mammogram: n/a. Results were: n/a  Obstetric History OB History  Gravida Para Term Preterm AB Living  2 2 2     2   SAB IAB Ectopic Multiple Live Births        0 2    # Outcome Date GA Lbr Len/2nd Weight Sex Delivery Anes PTL Lv  2 Term 12/28/19 [redacted]w[redacted]d 07:08 / 00:01 6 lb 12.8 oz (3.084 kg) M Vag-Spont None  LIV  1 Term 08/18/16 [redacted]w[redacted]d 08:05 / 00:34 6 lb 5.1 oz (2.866 kg) M Vag-Spont None  LIV    History reviewed. No pertinent past medical history.  Past Surgical History:  Procedure Laterality Date   NO PAST SURGERIES       Current Outpatient Medications:    medroxyPROGESTERone (DEPO-PROVERA) 150 MG/ML injection, Inject 1 mL (150 mg total) into the muscle every 3 (three) months., Disp: 1 mL, Rfl: 3   metroNIDAZOLE (FLAGYL) 500 MG tablet, Take 1 tablet (500 mg total) by mouth 2 (two) times daily., Disp: 14  tablet, Rfl: 2   medroxyPROGESTERone (DEPO-PROVERA) 150 MG/ML injection, INJECT 1 ML (150 MG TOTAL) INTO THE MUSCLE EVERY 3 (THREE) MONTHS, Disp: 1 mL, Rfl: 3   medroxyPROGESTERone Acetate 150 MG/ML SUSY, Inject 1 mL into the muscle every 3 (three) months., Disp: , Rfl:  No Known Allergies  Social History   Tobacco Use   Smoking status: Never   Smokeless tobacco: Never  Substance Use Topics   Alcohol use: Not Currently    Family History  Problem Relation Age of Onset   Diabetes Maternal Grandfather    Diabetes Mother    Hypertension Mother    Healthy Father       Review of Systems  Constitutional: negative for fatigue and weight loss Respiratory: negative for cough and wheezing Cardiovascular: negative for chest pain, fatigue and palpitations Gastrointestinal: negative for abdominal pain and change in bowel habits Musculoskeletal:negative for myalgias Neurological: negative for gait problems and tremors Behavioral/Psych: negative for abusive relationship, depression Endocrine: negative for temperature intolerance    Genitourinary:positive for vaginal discharge.  negative for abnormal menstrual periods, genital lesions, hot flashes, sexual problems  Integument/breast: negative for breast lump, breast tenderness, nipple discharge and skin lesion(s)    Objective:       BP 109/70   Pulse 81   Ht 5\' 10"  (1.778 m)   Wt 168 lb (76.2 kg)   BMI 24.11 kg/m  General:  Alert and no distress  Skin:   no rash or abnormalities  Lungs:   clear to auscultation bilaterally  Heart:   regular rate and rhythm, S1, S2 normal, no murmur, click, rub or gallop  Breasts:   normal without suspicious masses, skin or nipple changes or axillary nodes  Abdomen:  normal findings: no organomegaly, soft, non-tender and no hernia  Pelvis:  External genitalia: normal general appearance Urinary system: urethral meatus normal and bladder without fullness, nontender Vaginal: normal without tenderness,  induration or masses Cervix: normal appearance Adnexa: normal bimanual exam Uterus: anteverted and non-tender, normal size   Lab Review Urine pregnancy test Labs reviewed yes Radiologic studies reviewed no  Assessment:     1. Encounter for gynecological examination with Papanicolaou smear of cervix Rx: - Cytology - PAP( [facility])  2. Vaginal discharge Rx: - Cervicovaginal ancillary only( )  3. BV (bacterial vaginosis) Rx: - metroNIDAZOLE (FLAGYL) 500 MG tablet; Take 1 tablet (500 mg total) by mouth 2 (two) times daily.  Dispense: 14 tablet; Refill: 2  4. On Depo-Provera for contraception Rx: - medroxyPROGESTERone (DEPO-PROVERA) injection 150 mg - medroxyPROGESTERone (DEPO-PROVERA) 150 MG/ML injection; Inject 1 mL (150 mg total) into the muscle every 3 (three) months.  Dispense: 1 mL; Refill: 3    Plan:    Education reviewed: calcium supplements, depression evaluation, low fat, low cholesterol diet, safe sex/STD prevention, self breast exams, and weight bearing exercise. Contraception: Depo-Provera injections. Follow up in: 1 year.   Meds ordered this encounter  Medications   medroxyPROGESTERone (DEPO-PROVERA) injection 150 mg   medroxyPROGESTERone (DEPO-PROVERA) 150 MG/ML injection    Sig: Inject 1 mL (150 mg total) into the muscle every 3 (three) months.    Dispense:  1 mL    Refill:  3   metroNIDAZOLE (FLAGYL) 500 MG tablet    Sig: Take 1 tablet (500 mg total) by mouth 2 (two) times daily.    Dispense:  14 tablet    Refill:  2     Shelly Bombard, MD 05/28/2022 3:55 PM

## 2022-05-28 NOTE — Progress Notes (Signed)
26 y.o GYN presents for AEX/PAP.  Reports no concerns today.   Patient supplied DEPO Injection given in [facility], tolerated well. Next DEPO due Jan.18 - Feb. 1, 2024  Administrations This Visit     medroxyPROGESTERone (DEPO-PROVERA) injection 150 mg     Admin Date 05/28/2022 Action Given Dose 150 mg Route Intramuscular Administered By Tamela Oddi, RMA

## 2022-05-29 ENCOUNTER — Other Ambulatory Visit: Payer: Self-pay | Admitting: Obstetrics

## 2022-05-29 DIAGNOSIS — B379 Candidiasis, unspecified: Secondary | ICD-10-CM

## 2022-05-29 LAB — CERVICOVAGINAL ANCILLARY ONLY
Bacterial Vaginitis (gardnerella): POSITIVE — AB
Candida Glabrata: NEGATIVE
Candida Vaginitis: POSITIVE — AB
Chlamydia: NEGATIVE
Comment: NEGATIVE
Comment: NEGATIVE
Comment: NEGATIVE
Comment: NEGATIVE
Comment: NEGATIVE
Comment: NORMAL
Neisseria Gonorrhea: NEGATIVE
Trichomonas: NEGATIVE

## 2022-05-29 MED ORDER — FLUCONAZOLE 150 MG PO TABS: 150.0000 mg | ORAL_TABLET | Freq: Once | ORAL | 0 refills | Status: AC

## 2022-06-01 ENCOUNTER — Telehealth: Payer: Self-pay | Admitting: Emergency Medicine

## 2022-06-01 NOTE — Telephone Encounter (Signed)
Attempted TC to patient to discuss results. LVM. MyChart Message sent.

## 2022-06-04 LAB — CYTOLOGY - PAP
Comment: NEGATIVE
Comment: NEGATIVE
Comment: NEGATIVE
Diagnosis: UNDETERMINED — AB
HPV 16: NEGATIVE
HPV 18 / 45: POSITIVE — AB
High risk HPV: POSITIVE — AB

## 2022-06-05 ENCOUNTER — Telehealth: Payer: Self-pay | Admitting: Emergency Medicine

## 2022-06-05 ENCOUNTER — Encounter: Payer: Self-pay | Admitting: Emergency Medicine

## 2022-06-05 NOTE — Telephone Encounter (Signed)
TC to patient to discuss  results. Transferred to front desk to discuss scheduling colpo. HPV vaccine info sent via mychart.

## 2022-08-17 ENCOUNTER — Other Ambulatory Visit: Payer: Self-pay | Admitting: Obstetrics and Gynecology

## 2022-08-17 DIAGNOSIS — Z3042 Encounter for surveillance of injectable contraceptive: Secondary | ICD-10-CM

## 2022-08-20 ENCOUNTER — Ambulatory Visit (INDEPENDENT_AMBULATORY_CARE_PROVIDER_SITE_OTHER): Payer: Medicaid Other | Admitting: General Practice

## 2022-08-20 VITALS — BP 120/80 | HR 76 | Ht 70.0 in | Wt 170.9 lb

## 2022-08-20 DIAGNOSIS — Z3042 Encounter for surveillance of injectable contraceptive: Secondary | ICD-10-CM | POA: Diagnosis not present

## 2022-08-20 MED ORDER — MEDROXYPROGESTERONE ACETATE 150 MG/ML IM SUSP
150.0000 mg | Freq: Once | INTRAMUSCULAR | Status: AC
  Administered 2022-08-20: 150 mg via INTRAMUSCULAR

## 2022-08-20 NOTE — Progress Notes (Signed)
Date last pap: 05-28-22. Last Depo-Provera: 05-28-22. Side Effects if any: Pt tolerated well. Serum HCG indicated? NA. Depo-Provera 150 mg IM given by: Arlie Solomons, CMA in the [facility] per pt request. Next appointment due 4/12-4/26.  Pt states she wants this to be her last Depo injection. Denies adverse reactions from Depo. Pt desires tubal ligation. Advised to schedule tubal ligation consult or to call office to schedule next Depo injection if she changes her mind as she in unsure of her desired birth control method at this time.

## 2022-11-09 ENCOUNTER — Other Ambulatory Visit: Payer: Self-pay | Admitting: Obstetrics and Gynecology

## 2022-11-09 DIAGNOSIS — Z3042 Encounter for surveillance of injectable contraceptive: Secondary | ICD-10-CM

## 2022-11-12 ENCOUNTER — Ambulatory Visit: Payer: Medicaid Other

## 2022-11-13 ENCOUNTER — Telehealth: Payer: Self-pay

## 2022-11-13 NOTE — Telephone Encounter (Signed)
LVM for pt to return call

## 2022-11-13 NOTE — Telephone Encounter (Signed)
-----   Message from Warden Fillers, MD sent at 11/09/2022 10:30 AM EDT ----- Regarding: abnl pap Pt had abnl pap in 2023.  She is requesting depo provera refill.  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  Pt needs exam and repap.  Will give one last depo shot at this time.

## 2022-11-16 ENCOUNTER — Ambulatory Visit (INDEPENDENT_AMBULATORY_CARE_PROVIDER_SITE_OTHER): Payer: Medicaid Other | Admitting: *Deleted

## 2022-11-16 VITALS — BP 128/79 | HR 77

## 2022-11-16 DIAGNOSIS — Z3042 Encounter for surveillance of injectable contraceptive: Secondary | ICD-10-CM | POA: Diagnosis not present

## 2022-11-16 MED ORDER — MEDROXYPROGESTERONE ACETATE 150 MG/ML IM SUSY
150.0000 mg | PREFILLED_SYRINGE | Freq: Once | INTRAMUSCULAR | Status: AC
  Administered 2022-11-16: 150 mg via INTRAMUSCULAR

## 2022-11-16 NOTE — Progress Notes (Signed)
Date last pap: 05/28/22 (ASCUS, HR HPV, HPV 18/45) Pt needs colpo. Last Depo-Provera: 08/20/22. Side Effects if any: NA. Serum HCG indicated? NA. Depo-Provera 150 mg IM given by: Montez Morita, RN in RU[facility] glut. Next appointment due 02/01/23-02/15/23.  Colpo scheduled at check out. Answered pt questions regarding abnormal pap and colpo procedure. Education sent via MyChart. Pt verbalized understanding.

## 2022-11-19 ENCOUNTER — Telehealth: Payer: Self-pay

## 2022-11-19 NOTE — Telephone Encounter (Signed)
Returned call and answered pt questions about colpo/hpv

## 2022-12-29 ENCOUNTER — Encounter: Payer: Self-pay | Admitting: Obstetrics and Gynecology

## 2022-12-29 ENCOUNTER — Encounter: Payer: Medicaid Other | Admitting: Obstetrics and Gynecology

## 2023-01-19 ENCOUNTER — Ambulatory Visit: Payer: Medicaid Other | Admitting: Hematology and Oncology

## 2023-01-19 VITALS — BP 128/84 | Wt 167.0 lb

## 2023-01-19 DIAGNOSIS — R87610 Atypical squamous cells of undetermined significance on cytologic smear of cervix (ASC-US): Secondary | ICD-10-CM

## 2023-01-19 DIAGNOSIS — Z01812 Encounter for preprocedural laboratory examination: Secondary | ICD-10-CM

## 2023-01-19 NOTE — Progress Notes (Signed)
Ms. Bianna Haran is a 27 y.o. female who presents to [facility] clinic today with no complaints.    Pap Smear: Pap not smear completed today. Last Pap smear was 05/28/22 and was abnormal - ASCUS/ HPV+ . Per patient has no history of an abnormal Pap smear. Last Pap smear result is available in Epic.   Physical exam: Breasts Breasts symmetrical. No skin abnormalities bilateral breasts. No nipple retraction bilateral breasts. No nipple discharge bilateral breasts. No lymphadenopathy. No lumps palpated bilateral breasts.       Pelvic/Bimanual Pap is not indicated today    Smoking History: Patient has never smoked and was not referred to quit line.    Patient Navigation: Patient education provided. Access to services provided for patient through [facility] program. No interpreter provided. No transportation provided   Colorectal Cancer Screening: Per patient has never had colonoscopy completed No complaints today.    Breast and Cervical Cancer Risk Assessment: Patient does not have family history of breast cancer, known genetic mutations, or radiation treatment to the chest before age 81. Patient does not have history of cervical dysplasia, immunocompromised, or DES exposure in-utero.  Risk Assessment   No risk assessment data     A: B[facility]CP exam without pap smear No complaints with benign exam. Colposcopy for abnormal pap smear as above.   P: Continue annual clinical breast exam. Follow up according to guidelines for biopsy results.   Pascal Lux, NP 01/19/2023 2:44 PM

## 2023-01-20 ENCOUNTER — Other Ambulatory Visit (HOSPITAL_COMMUNITY)
Admission: RE | Admit: 2023-01-20 | Discharge: 2023-01-20 | Disposition: A | Discharge status: Home / Self Care | Payer: Medicaid Other | Source: Ambulatory Visit | Attending: Obstetrics and Gynecology | Admitting: Obstetrics and Gynecology

## 2023-01-20 DIAGNOSIS — R87610 Atypical squamous cells of undetermined significance on cytologic smear of cervix (ASC-US): Secondary | ICD-10-CM | POA: Diagnosis present

## 2023-01-20 DIAGNOSIS — R87810 Cervical high risk human papillomavirus (HPV) DNA test positive: Secondary | ICD-10-CM | POA: Insufficient documentation

## 2023-01-22 LAB — SURGICAL PATHOLOGY

## 2023-01-26 ENCOUNTER — Telehealth: Payer: Self-pay

## 2023-01-26 ENCOUNTER — Other Ambulatory Visit: Payer: Self-pay

## 2023-01-26 NOTE — Telephone Encounter (Signed)
-----   Message from Pascal Lux, NP sent at 01/22/2023 12:53 PM EDT ----- She will need referral for LEEP and Medicaid application.  Thanks Melissa ----- Message ----- From: Caprice Red, CMA Sent: 01/19/2023   2:45 PM EDT To: Pascal Lux, NP

## 2023-01-26 NOTE — Telephone Encounter (Signed)
Referral for LEEP has been sent to [facility]. Also completed Medicaid application has been sent to C. Brannock, Technical sales engineer, for review and completion.

## 2023-01-26 NOTE — Telephone Encounter (Addendum)
Pt presented to the [facility] and her COLPO results were reviewed with her. We have also completed her Medicaid application. Pt understands the states application processing time can take several weeks to be approved and to be aware that she may receive mail and/or phone calls as her application is being processed.

## 2023-01-30 ENCOUNTER — Other Ambulatory Visit: Payer: Self-pay | Admitting: Obstetrics and Gynecology

## 2023-01-30 DIAGNOSIS — Z3042 Encounter for surveillance of injectable contraceptive: Secondary | ICD-10-CM

## 2023-02-01 ENCOUNTER — Ambulatory Visit: Payer: Medicaid Other

## 2023-02-09 ENCOUNTER — Ambulatory Visit (INDEPENDENT_AMBULATORY_CARE_PROVIDER_SITE_OTHER): Payer: Medicaid Other | Admitting: *Deleted

## 2023-02-09 VITALS — BP 131/85 | HR 84

## 2023-02-09 DIAGNOSIS — Z3042 Encounter for surveillance of injectable contraceptive: Secondary | ICD-10-CM

## 2023-02-09 MED ORDER — MEDROXYPROGESTERONE ACETATE 150 MG/ML IM SUSP
150.0000 mg | Freq: Once | INTRAMUSCULAR | Status: AC
  Administered 2023-02-09: 150 mg via INTRAMUSCULAR

## 2023-02-09 NOTE — Progress Notes (Signed)
Date last pap: 05/28/22 (HR HPV, HPV 18/45 )(Colpo 6/25 BCCCP, LEEP scheduled 03/26/23) Last Depo-Provera: 11/16/22. Side Effects if any: NA. Serum HCG indicated? NA. Depo-Provera 150 mg IM given by: Montez Morita, RN. Next appointment due 04/27/23-05/11/23.

## 2023-03-10 ENCOUNTER — Telehealth: Payer: Self-pay

## 2023-03-10 NOTE — Telephone Encounter (Signed)
[facility] Medicaid approved [id number] P, effective 02/25/2023- 08/26/2022 (6 months) with retroactive coverage beginning 12/26/2022. Patient informed.

## 2023-03-26 ENCOUNTER — Ambulatory Visit: Payer: Medicaid Other | Admitting: Obstetrics & Gynecology

## 2023-03-26 ENCOUNTER — Encounter: Payer: Self-pay | Admitting: Obstetrics & Gynecology

## 2023-03-26 ENCOUNTER — Other Ambulatory Visit (HOSPITAL_COMMUNITY)
Admission: RE | Admit: 2023-03-26 | Discharge: 2023-03-26 | Disposition: A | Discharge status: Home / Self Care | Payer: Medicaid Other | Source: Ambulatory Visit | Attending: Obstetrics & Gynecology | Admitting: Obstetrics & Gynecology

## 2023-03-26 VITALS — BP 117/80 | HR 83 | Ht 70.0 in | Wt 167.0 lb

## 2023-03-26 DIAGNOSIS — D069 Carcinoma in situ of cervix, unspecified: Secondary | ICD-10-CM | POA: Insufficient documentation

## 2023-03-26 DIAGNOSIS — N871 Moderate cervical dysplasia: Secondary | ICD-10-CM | POA: Diagnosis not present

## 2023-03-26 DIAGNOSIS — Z3202 Encounter for pregnancy test, result negative: Secondary | ICD-10-CM

## 2023-03-26 LAB — POCT URINE PREGNANCY: Preg Test, Ur: NEGATIVE

## 2023-03-26 NOTE — Progress Notes (Addendum)
27 y.o. 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  UPT today is Negative.  Informed Consent  done.

## 2023-03-26 NOTE — Progress Notes (Signed)
Patient ID: Brandi Henson, female   DOB: Jun 10, 1996, 27 y.o.   MRN: [id number]  Chief Complaint  Patient presents with   LEEP    HPI Brandi Henson is a 27 y.o. female.  PLAN: No LMP recorded. Patient has had an injection.  HPI  Indications: Pap smear on November 2023 showed: ASCUS with POSITIVE high risk HPV. Previous colposcopy: CIN 3 and in 01/20/23 at [facility]. Prior cervical treatment: no treatment.  No past medical history on file.  Past Surgical History:  Procedure Laterality Date   NO PAST SURGERIES      Family History  Problem Relation Age of Onset   Diabetes Maternal Grandfather    Diabetes Mother    Hypertension Mother    Healthy Father     Social History Social History   Tobacco Use   Smoking status: Never   Smokeless tobacco: Never  Vaping Use   Vaping status: Never Used  Substance Use Topics   Alcohol use: Not Currently   Drug use: Not Currently    Frequency: 3.0 times per week    Types: Marijuana    Comment: pt states last in 2018    No Known Allergies  Current Outpatient Medications  Medication Sig Dispense Refill   medroxyPROGESTERone Acetate 150 MG/ML SUSY INJECT 1 ML INTO THE MUSCLE EVERY 3 MONTHS 1 mL 2   No current facility-administered medications for this visit.    Review of Systems Review of Systems  All other systems reviewed and are negative.   Blood pressure 117/80, pulse 83, height 5\' 10"  (1.778 m), weight 167 lb (75.8 kg).  Physical Exam Physical Exam Vitals and nursing note reviewed. Exam conducted with a chaperone present.  Genitourinary:    General: Normal vulva.     Exam position: Lithotomy position.     Vagina: Normal.     Cervix: Normal.   Patient given informed consent, signed copy in the chart, time out was performed.  Placed in lithotomy position. Cervix viewed with speculum and colposcope after application of acetic acid and Lugol's solution  Colposcopy adequate?  yes Acetowhite  lesions?yes Punctation?no Mosaicism?  no Abnormal vasculature?  no Biopsies?LEEP ECC?no Patient identified, informed consent obtained, signed copy in chart, time out performed.  Pap smear and colposcopy reviewed.   Pap ASCUS Colpo Biopsy CIN 3 ECC negative Teflon coated speculum with smoke evacuator placed.  Cervix visualized. Paracervical block placed.  Small Fischer size loop used to remove cone of cervix using blend of cut and cautery on LEEP machine.  Edges/Base cauterized with ball.  Monsel's solution used for hemostasis.  Patient tolerated procedure well.     Data Reviewed Pap and biopsies  Assessment   High grade squamous intraepithelial lesion (HGSIL), grade 3 CIN, on biopsy of cervix - Plan: POCT urine pregnancy, Surgical pathology( [facility]/ [facility])  Procedure Details  The risks and benefits of the procedure and Written informed consent obtained.  Speculum placed in vagina and excellent visualization of cervix achieved, cervix swabbed x 3 with acetic acid solution.  Specimens: LEEP  Complications: none.     Plan    Specimens labelled and sent to Pathology. Return to discuss Pathology results in 2 weeks.      Scheryl Darter 03/26/2023, 10:58 AM

## 2023-04-01 LAB — SURGICAL PATHOLOGY

## 2023-04-03 NOTE — Progress Notes (Signed)
F/U pap in 12 months after LEEP

## 2023-05-03 ENCOUNTER — Ambulatory Visit: Payer: Medicaid Other

## 2023-10-02 DIAGNOSIS — H5213 Myopia, bilateral: Secondary | ICD-10-CM | POA: Diagnosis not present

## 2023-10-13 ENCOUNTER — Ambulatory Visit: Payer: Medicaid Other | Admitting: Physician Assistant

## 2023-10-13 ENCOUNTER — Encounter: Payer: Self-pay | Admitting: Physician Assistant

## 2023-10-13 VITALS — BP 126/77 | HR 101 | Ht 70.0 in | Wt 173.3 lb

## 2023-10-13 NOTE — Progress Notes (Deleted)
 ANNUAL EXAM Patient name: Brandi Henson MRN [id number]  Date of birth: November 14, 1995 Chief Complaint:   No chief complaint on file.  History of Present Illness:   Brandi Henson is a 28 y.o. G51P2002 {race:25618} female being seen today for a routine annual exam.  Current complaints: ***  No LMP recorded. Patient has had an injection.   The pregnancy intention screening data noted above was reviewed. Potential methods of contraception were discussed. The patient elected to proceed with No data recorded.   Last pap ***.  Last mammogram: Not yet indicated due to age. Results were: {normal, abnormal, n/a:23837}. Family h/o breast cancer: {yes***/no:23838} Last colonoscopy: Not yet indicated due to age. Results were: {normal, abnormal, n/a:23837}. Family h/o colorectal cancer: {yes***/no:23838} STI screening: Contraception:     05/28/2022    3:12 PM 02/13/2020    9:55 AM 09/29/2016    9:57 AM 12/02/2015    4:33 PM 01/18/2015   10:32 AM  Depression screen PHQ 2/9  Decreased Interest 3 1 1  0 0  Down, Depressed, Hopeless 1 2 0 0 0  PHQ - 2 Score 4 3 1  0 0  Altered sleeping 0 3 3    Tired, decreased energy 3 2 1     Change in appetite 0 3 3    Feeling bad or failure about yourself  0 0 0    Trouble concentrating 1 2 3     Moving slowly or fidgety/restless 0 0 0    Suicidal thoughts 0 0 0    PHQ-9 Score 8 13 11     Difficult doing work/chores Not difficult at all            04/24/2021    8:36 AM  GAD 7 : Generalized Anxiety Score  Nervous, Anxious, on Edge 0  Control/stop worrying 0  Worry too much - different things 0  Trouble relaxing 0  Restless 0  Easily annoyed or irritable 0  Afraid - awful might happen 0  Total GAD 7 Score 0  Anxiety Difficulty Not difficult at all     Review of Systems:   Pertinent items are noted in HPI Denies any headaches, blurred vision, fatigue, shortness of breath, chest pain, abdominal pain, abnormal vaginal discharge/itching/odor/irritation,  problems with periods, bowel movements, urination, or intercourse unless otherwise stated above. Pertinent History Reviewed:  Reviewed past medical,surgical, social and family history.  Reviewed problem list, medications and allergies. Physical Assessment:  There were no vitals filed for this visit.There is no height or weight on file to calculate BMI.        Physical Examination:   General appearance - well appearing, and in no distress  Mental status - alert, oriented to person, place, and time  Psych:  She has a normal mood and affect  Skin - warm and dry, normal color, no suspicious lesions noted  Chest - effort normal, all lung fields clear to auscultation bilaterally  Heart - normal rate and regular rhythm  Neck:  midline trachea, no thyromegaly or nodules  Breasts - breasts appear normal, no suspicious masses, no skin or nipple changes or  axillary nodes  Abdomen - soft, nontender, nondistended, no masses or organomegaly  Pelvic - VULVA: normal appearing vulva with no masses, tenderness or lesions  VAGINA: normal appearing vagina with normal color and discharge, no lesions  CERVIX: normal appearing cervix without discharge or lesions, no CMT  Thin prep pap is {Desc; done/not:10129} *** HR HPV cotesting  UTERUS: uterus is felt to be normal  size, shape, consistency and nontender   ADNEXA: No adnexal masses or tenderness noted.  Extremities:  No swelling or varicosities noted  Chaperone present for exam  No results found for this or any previous visit (from the past 24 hours).  Assessment & Plan:   1. Encounter for annual routine gynecological examination (Primary) 2. Cervical cancer screening ***  - Cervical cancer screening: Discussed screening Q3 years. Reviewed importance of annual exams and limits of pap smear. Pap with reflex HPV- repeat September 2025 - GC/CT: Discussed and recommended. Pt  {Blank single:19197::"accepts","declines"} - Gardasil: {Blank  single:19197::"***","has not yet had. Will provide information","completed","has not yet had. Counseling provided and she declines","Has not yet had. Counseling provided and pt accepts"} - Birth Control: {Birth control type:23956} - Breast Health: Encouraged self breast awareness/exams. Teaching provided. - Mammogram: {Mammo f/u:25212::"@ 29yo"}, or sooner if problems - Colonoscopy: {TCS f/u:25213::"@ 28yo"}, or sooner if problems - Follow-up: 12 months and prn     No orders of the defined types were placed in this encounter.   Meds: No orders of the defined types were placed in this encounter.   Follow-up: No follow-ups on file.  Ralene Muskrat, [place] 10/13/2023 12:53 PM

## 2023-10-13 NOTE — Progress Notes (Unsigned)
 Pt presents for F/U after leep. Pt has no questions or concerns at this time.

## 2024-02-16 ENCOUNTER — Ambulatory Visit (HOSPITAL_BASED_OUTPATIENT_CLINIC_OR_DEPARTMENT_OTHER): Admitting: Family Medicine
